# Patient Record
Sex: Female | Born: 1935 | Race: White | Hispanic: No | Smoking: Former smoker
Health system: Southern US, Community
[De-identification: ages and names within clinical notes are randomized; demographics above are authoritative.]

## PROBLEM LIST (undated history)

## (undated) DIAGNOSIS — N3281 Overactive bladder: Secondary | ICD-10-CM

## (undated) DIAGNOSIS — I2510 Atherosclerotic heart disease of native coronary artery without angina pectoris: Secondary | ICD-10-CM

## (undated) DIAGNOSIS — I10 Essential (primary) hypertension: Secondary | ICD-10-CM

## (undated) DIAGNOSIS — H3530 Unspecified macular degeneration: Secondary | ICD-10-CM

## (undated) DIAGNOSIS — G4700 Insomnia, unspecified: Secondary | ICD-10-CM

## (undated) DIAGNOSIS — C801 Malignant (primary) neoplasm, unspecified: Secondary | ICD-10-CM

## (undated) DIAGNOSIS — E039 Hypothyroidism, unspecified: Secondary | ICD-10-CM

## (undated) DIAGNOSIS — G4730 Sleep apnea, unspecified: Secondary | ICD-10-CM

## (undated) HISTORY — DX: Overactive bladder: N32.81

## (undated) HISTORY — DX: Essential (primary) hypertension: I10

## (undated) HISTORY — DX: Malignant (primary) neoplasm, unspecified: C80.1

## (undated) HISTORY — DX: Unspecified macular degeneration: H35.30

## (undated) HISTORY — DX: Sleep apnea, unspecified: G47.30

## (undated) HISTORY — DX: Insomnia, unspecified: G47.00

## (undated) HISTORY — PX: REPLACEMENT TOTAL KNEE BILATERAL: SUR1225

## (undated) HISTORY — PX: BACK SURGERY: SHX140

## (undated) HISTORY — PX: CHOLECYSTECTOMY: SHX55

## (undated) HISTORY — PX: HYSTEROTOMY: SHX1776

## (undated) HISTORY — PX: CATARACT EXTRACTION: SUR2

## (undated) HISTORY — DX: Atherosclerotic heart disease of native coronary artery without angina pectoris: I25.10

## (undated) HISTORY — PX: TONSILLECTOMY AND ADENOIDECTOMY: SUR1326

## (undated) HISTORY — PX: PTCA: SHX146

## (undated) HISTORY — DX: Hypothyroidism, unspecified: E03.9

## (undated) HISTORY — PX: APPENDECTOMY: SHX54

## (undated) HISTORY — PX: LYMPH GLAND EXCISION: SHX13

---

## 1988-12-09 DIAGNOSIS — I2510 Atherosclerotic heart disease of native coronary artery without angina pectoris: Secondary | ICD-10-CM

## 1988-12-09 HISTORY — DX: Atherosclerotic heart disease of native coronary artery without angina pectoris: I25.10

## 2013-03-09 ENCOUNTER — Encounter (HOSPITAL_BASED_OUTPATIENT_CLINIC_OR_DEPARTMENT_OTHER): Payer: Medicare Other | Attending: General Surgery

## 2013-03-09 DIAGNOSIS — L97909 Non-pressure chronic ulcer of unspecified part of unspecified lower leg with unspecified severity: Secondary | ICD-10-CM | POA: Insufficient documentation

## 2013-03-09 DIAGNOSIS — Z79899 Other long term (current) drug therapy: Secondary | ICD-10-CM | POA: Insufficient documentation

## 2013-03-10 NOTE — H&P (Signed)
NAMEMarland Kitchen  Bauer, Vicki NO.:  [id number]  MEDICAL RECORD NO.:  [id number]  LOCATION:  FOOT                         FACILITY:  [facility]  PHYSICIAN:  Joanne Gavel, M.D.        DATE OF BIRTH:  1928/06/20  DATE OF ADMISSION:  03/09/2013 DATE OF DISCHARGE:                             HISTORY & PHYSICAL   CHIEF COMPLAINT:  Wound, right leg.  HISTORY OF PRESENT ILLNESS:  This patient dropped a box on her right leg, developed a hematoma which was developed into a sizable wound. This occurred about 3 weeks ago.  She has a history of discoloration of the legs bilaterally.  PAST MEDICAL HISTORY:  Significant for shingles, lower extremity neuropathy, incontinence, and macular degeneration.  PAST SURGICAL HISTORY:  Tonsils and adenoids, hysterectomy, knee replacements, lymph node biopsies, cataracts both eyes, gallbladder, and appendectomy.  SOCIAL HISTORY:  Cigarettes none for 30 years.  Alcohol none for 30 years.  REVIEW OF SYSTEMS:  No heart disease, lung disease, or kidney disease.  MEDICATIONS:  Diovan, isosorbide, Metamucil, potassium, Tessalon, trazodone, Toviaz, amlodipine, Lyrica, tramadol, zolpidem, bupropion, Celebrex, levothyroxine, Klonopin, and bisoprolol.  ALLERGIES:  MORPHINE and DEMEROL.  REVIEW OF SYSTEMS:  As above.  PHYSICAL EXAMINATION:  VITAL SIGNS:  Temperature 98.4, pulse 84, respirations 20, blood pressure 115/71. GENERAL APPEARANCE:  Well developed, well nourished, no distress. CHEST:  Clear. HEART:  Regular rhythm. ABDOMEN:  Not examined. EXTREMITIES: Examination of the right lower extremity reveals an ABI of 0.8.  Peripheral pulses are weakly palpable.  Anteriorly is a 5.3 x 6.0 x 0.1 skin tear with dead skin covering a bright red base.  IMPRESSION:  Chronic venous hypertension with ulcer associated with trauma.  PLAN:  We will see her in 7 days.  We will start with Silvadene and Kerlix Coban wrap.     Joanne Gavel,  M.D.     RA/MEDQ  D:  03/09/2013  T:  03/10/2013  Job:  [id number]

## 2013-04-09 ENCOUNTER — Encounter (HOSPITAL_BASED_OUTPATIENT_CLINIC_OR_DEPARTMENT_OTHER): Payer: MEDICARE | Attending: General Surgery

## 2013-04-09 DIAGNOSIS — I87319 Chronic venous hypertension (idiopathic) with ulcer of unspecified lower extremity: Secondary | ICD-10-CM | POA: Diagnosis not present

## 2013-04-09 DIAGNOSIS — L97909 Non-pressure chronic ulcer of unspecified part of unspecified lower leg with unspecified severity: Secondary | ICD-10-CM | POA: Insufficient documentation

## 2013-04-09 DIAGNOSIS — Z79899 Other long term (current) drug therapy: Secondary | ICD-10-CM | POA: Insufficient documentation

## 2013-04-16 DIAGNOSIS — L97909 Non-pressure chronic ulcer of unspecified part of unspecified lower leg with unspecified severity: Secondary | ICD-10-CM | POA: Diagnosis not present

## 2013-04-16 DIAGNOSIS — Z79899 Other long term (current) drug therapy: Secondary | ICD-10-CM | POA: Diagnosis not present

## 2013-04-23 DIAGNOSIS — L97909 Non-pressure chronic ulcer of unspecified part of unspecified lower leg with unspecified severity: Secondary | ICD-10-CM | POA: Diagnosis not present

## 2013-04-23 DIAGNOSIS — Z79899 Other long term (current) drug therapy: Secondary | ICD-10-CM | POA: Diagnosis not present

## 2013-04-30 DIAGNOSIS — L97909 Non-pressure chronic ulcer of unspecified part of unspecified lower leg with unspecified severity: Secondary | ICD-10-CM | POA: Diagnosis not present

## 2013-04-30 DIAGNOSIS — Z79899 Other long term (current) drug therapy: Secondary | ICD-10-CM | POA: Diagnosis not present

## 2013-05-07 DIAGNOSIS — Z79899 Other long term (current) drug therapy: Secondary | ICD-10-CM | POA: Diagnosis not present

## 2013-05-07 DIAGNOSIS — L97909 Non-pressure chronic ulcer of unspecified part of unspecified lower leg with unspecified severity: Secondary | ICD-10-CM | POA: Diagnosis not present

## 2013-05-14 ENCOUNTER — Encounter (HOSPITAL_BASED_OUTPATIENT_CLINIC_OR_DEPARTMENT_OTHER): Payer: MEDICARE | Attending: General Surgery

## 2013-05-14 DIAGNOSIS — L97909 Non-pressure chronic ulcer of unspecified part of unspecified lower leg with unspecified severity: Secondary | ICD-10-CM | POA: Diagnosis not present

## 2013-05-21 DIAGNOSIS — I87319 Chronic venous hypertension (idiopathic) with ulcer of unspecified lower extremity: Secondary | ICD-10-CM | POA: Diagnosis not present

## 2013-05-28 DIAGNOSIS — I87319 Chronic venous hypertension (idiopathic) with ulcer of unspecified lower extremity: Secondary | ICD-10-CM | POA: Diagnosis not present

## 2013-06-04 DIAGNOSIS — L97909 Non-pressure chronic ulcer of unspecified part of unspecified lower leg with unspecified severity: Secondary | ICD-10-CM | POA: Diagnosis not present

## 2013-08-03 ENCOUNTER — Other Ambulatory Visit: Payer: Self-pay | Admitting: Pain Medicine

## 2013-08-03 DIAGNOSIS — M542 Cervicalgia: Secondary | ICD-10-CM

## 2013-08-03 DIAGNOSIS — M545 Low back pain: Secondary | ICD-10-CM

## 2013-08-03 DIAGNOSIS — M79604 Pain in right leg: Secondary | ICD-10-CM

## 2013-08-12 ENCOUNTER — Other Ambulatory Visit: Payer: Medicare Other

## 2013-08-16 ENCOUNTER — Other Ambulatory Visit: Payer: Medicare Other

## 2013-08-20 ENCOUNTER — Other Ambulatory Visit: Payer: Medicare Other

## 2013-08-23 ENCOUNTER — Other Ambulatory Visit: Payer: Medicare Other

## 2013-08-30 ENCOUNTER — Ambulatory Visit
Admission: RE | Admit: 2013-08-30 | Discharge: 2013-08-30 | Disposition: A | Discharge status: Home / Self Care | Payer: MEDICARE | Source: Ambulatory Visit | Attending: Pain Medicine | Admitting: Pain Medicine

## 2013-08-30 DIAGNOSIS — M79604 Pain in right leg: Secondary | ICD-10-CM

## 2013-08-30 DIAGNOSIS — M545 Low back pain: Secondary | ICD-10-CM

## 2013-08-30 DIAGNOSIS — M542 Cervicalgia: Secondary | ICD-10-CM

## 2013-08-30 MED ORDER — GADOBENATE DIMEGLUMINE 529 MG/ML IV SOLN
17.0000 mL | Freq: Once | INTRAVENOUS | Status: AC | PRN
  Administered 2013-08-30: 17 mL via INTRAVENOUS

## 2013-10-13 ENCOUNTER — Other Ambulatory Visit: Payer: Self-pay | Admitting: Pain Medicine

## 2013-10-13 DIAGNOSIS — M549 Dorsalgia, unspecified: Secondary | ICD-10-CM

## 2013-10-15 ENCOUNTER — Encounter (INDEPENDENT_AMBULATORY_CARE_PROVIDER_SITE_OTHER): Payer: MEDICARE | Admitting: Ophthalmology

## 2013-10-15 DIAGNOSIS — H3530 Unspecified macular degeneration: Secondary | ICD-10-CM

## 2013-10-15 DIAGNOSIS — H35039 Hypertensive retinopathy, unspecified eye: Secondary | ICD-10-CM

## 2013-10-15 DIAGNOSIS — H43819 Vitreous degeneration, unspecified eye: Secondary | ICD-10-CM

## 2013-10-15 DIAGNOSIS — I10 Essential (primary) hypertension: Secondary | ICD-10-CM

## 2013-10-20 ENCOUNTER — Other Ambulatory Visit: Payer: 59

## 2013-10-22 ENCOUNTER — Ambulatory Visit
Admission: RE | Admit: 2013-10-22 | Discharge: 2013-10-22 | Disposition: A | Discharge status: Home / Self Care | Payer: MEDICARE | Source: Ambulatory Visit | Attending: Pain Medicine | Admitting: Pain Medicine

## 2013-10-22 DIAGNOSIS — M549 Dorsalgia, unspecified: Secondary | ICD-10-CM

## 2014-02-21 ENCOUNTER — Encounter: Payer: Self-pay | Admitting: Diagnostic Neuroimaging

## 2014-02-22 ENCOUNTER — Encounter: Payer: Self-pay | Admitting: Diagnostic Neuroimaging

## 2014-02-22 ENCOUNTER — Ambulatory Visit (INDEPENDENT_AMBULATORY_CARE_PROVIDER_SITE_OTHER): Payer: MEDICARE | Admitting: Diagnostic Neuroimaging

## 2014-02-22 ENCOUNTER — Encounter (INDEPENDENT_AMBULATORY_CARE_PROVIDER_SITE_OTHER): Payer: Self-pay

## 2014-02-22 VITALS — BP 105/65 | HR 68 | Ht 61.5 in | Wt 176.0 lb

## 2014-02-22 DIAGNOSIS — B0229 Other postherpetic nervous system involvement: Secondary | ICD-10-CM

## 2014-02-22 NOTE — Patient Instructions (Signed)
Follow up with pain mgmt and psychiatry.  Consider cymbalta.  Consider increasing lyrica dosing.

## 2014-02-22 NOTE — Progress Notes (Signed)
[facility]  PATIENT: Vicki Bauer DOB: February 07, 1928  REFERRING CLINICIAN: Wolters HISTORY FROM: patient and son REASON FOR VISIT: new consult   HISTORICAL  CHIEF COMPLAINT:  Chief Complaint  Patient presents with  . Neurologic Problem    neuralgia    HISTORY OF PRESENT ILLNESS:   80 year old left-handed female with history of neuropathy in the 1990s, "lymph node cancer" status post chemotherapy and lymph node resections, bilateral neurolysis, low back surgery, here for evaluation of postherpetic neuralgia pain.  2 years ago patient had shingles infection affecting her right breast region, right arm./Axillary region, right scapula and back region. Patient was evaluated in [place] where she was living. She did not receive antiviral therapy. She had not received shingles vaccination prior to this. Patient did have chickenpox age 16 or 80 years old. Patient is to the medication as well as a "injection" into the armpit. Following this injection patient had significant right arm numbness and pain.  Patient has continued to have numbness and burning pain in the same area as well as numbness in the right arm. Patient is being managed by pain management specialist, currently on Lyrica 50 mg 3 times a day, gabapentin 600 mg 3 times a day, intermittent Percocet as needed, compounded neuropathy creams. Unfortunately she is still having severe pain.   REVIEW OF SYSTEMS: Full 14 system review of systems performed and notable only for numbness depression joint pain and running nose incontinence loss of vision.  ALLERGIES: Allergies  Allergen Reactions  . Demerol [Meperidine]     Vomiting   . Morphine And Related     Turns blue    HOME MEDICATIONS: Outpatient Prescriptions Prior to Visit  Medication Sig Dispense Refill  . bisoprolol (ZEBETA) 5 MG tablet Take 5 mg by mouth daily.      Marland Kitchen buPROPion (WELLBUTRIN SR) 200 MG 12 hr tablet Take 200 mg by mouth 2 (two) times  daily.      . clonazePAM (KLONOPIN) 0.5 MG tablet Take 0.5 mg by mouth daily.      . furosemide (LASIX) 40 MG tablet Take 40 mg by mouth daily.      Marland Kitchen levothyroxine (SYNTHROID, LEVOTHROID) 25 MCG tablet Take 25 mcg by mouth daily before breakfast.      . LORazepam (ATIVAN) 1 MG tablet Take 1 mg by mouth 2 (two) times daily.      . Multiple Vitamins-Minerals (CENTRUM SILVER ADULT 50+) TABS Take 1 tablet by mouth daily.      Marland Kitchen oxyCODONE-acetaminophen (PERCOCET) 10-325 MG per tablet Take 1 tablet by mouth every 4 (four) hours as needed for pain.      . potassium chloride (KLOR-CON) 20 MEQ packet Take 20 mEq by mouth daily.      . pregabalin (LYRICA) 50 MG capsule Take 50 mg by mouth 3 (three) times daily.      . Zinc 50 MG CAPS Take 50 mg by mouth daily.      Marland Kitchen gabapentin (NEURONTIN) 600 MG tablet Take 600 mg by mouth daily.      . psyllium (METAMUCIL) 0.52 G capsule Take 0.52 g by mouth as needed.       No facility-administered medications prior to visit.    PAST MEDICAL HISTORY: Past Medical History  Diagnosis Date  . Hypertension   . Hypothyroidism   . Macular degeneration   . Cancer     lymphoma  . Insomnia   . OAB (overactive bladder)   . CAD (coronary artery disease)  1990    s/p stent x2,    PAST SURGICAL HISTORY: Past Surgical History  Procedure Laterality Date  . Tonsillectomy and adenoidectomy    . Hysterotomy    . Replacement total knee bilateral    . Lymph gland excision Bilateral   . Cholecystectomy    . Cataract extraction Bilateral   . Appendectomy    . Ptca    . Back surgery      FAMILY HISTORY: Family History  Problem Relation Age of Onset  . Arthritis Mother   . Thyroid disease Son   . Breast cancer Sister     SOCIAL HISTORY:  History   Social History  . Marital Status: Single    Spouse Name: N/A    Number of Children: 1  . Years of Education: BA   Occupational History  . Retired    Social History Main Topics  . Smoking status: Former  Smoker -- 1.00 packs/day    Types: Cigarettes    Quit date: 12/09/1973  . Smokeless tobacco: Never Used  . Alcohol Use: No     Comment: quit: 1982  . Drug Use: No  . Sexual Activity: Not on file   Other Topics Concern  . Not on file   Social History Narrative   Patient lives at home with son.   Caffeine Use: 1-2 12 oz sodas daily     PHYSICAL EXAM  Filed Vitals:   02/22/14 0904  BP: 105/65  Pulse: 68  Height: 5' 1.5" (1.562 m)  Weight: 176 lb (79.833 kg)    Not recorded    Body mass index is 32.72 kg/(m^2).  GENERAL EXAM: Patient is in no distress; well developed, nourished and groomed; neck is supple  CARDIOVASCULAR: Regular rate and rhythm, no murmurs, no carotid bruits  NEUROLOGIC: MENTAL STATUS: awake, alert, oriented to person, place and time, recent and remote memory intact, normal attention and concentration, language fluent, comprehension intact, naming intact, fund of knowledge appropriate; LOQUACIOUS. CRANIAL NERVE: no papilledema on fundoscopic exam, pupils equal and reactive to light, visual fields full to confrontation, extraocular muscles intact, no nystagmus, facial sensation and strength symmetric, hearing intact, palate elevates symmetrically, uvula midline, shoulder shrug symmetric, tongue midline. MOTOR: normal bulk and tone, full strength in the BUE, BLE SENSORY: VIBRATION < 5 SEC AT TOES. DECR TEMP AT FEET. COORDINATION: finger-nose-finger, fine finger movements normal REFLEXES: deep tendon reflexes TRACE and symmetric GAIT/STATION: SLOW, SHUFFLING, UNSTEADY GAIT.     DIAGNOSTIC DATA (LABS, IMAGING, TESTING) - I reviewed patient records, labs, notes, testing and imaging myself where available.  No results found for this basename: WBC, HGB, HCT, MCV, PLT   No results found for this basename: na, k, cl, co2, glucose, bun, creatinine, calcium, prot, albumin, ast, alt, alkphos, bilitot, gfrnonaa, gfraa   No results found for this basename:  CHOL, HDL, LDLCALC, LDLDIRECT, TRIG, CHOLHDL   No results found for this basename: HGBA1C   No results found for this basename: VITAMINB12   No results found for this basename: TSH    I reviewed images myself and agree with interpretation. - VRP  08/31/13 MRI CERVICAL 1. Multilevel degenerative disc and facet disease, most notable at C3-4 and C4-5 where there is foraminal stenosis secondary to uncovertebral and facet spurs.  2. No significant canal stenosis.  10/23/13 MRI THORACIC  No significant abnormality of the thoracic spine. No findings to explain the patient's symptoms.  08/31/13 MRI LUMBAR  Diffuse degenerative disc and facet disease, most notable at  L5-S1 where anterolisthesis and advanced facet degeneration causes foraminal and canal (especially the lateral recesses) stenosis.   ASSESSMENT AND PLAN  80 y.o. year old female here with post herpetic neuralgia since 2013 (right upper thoracic).  PLAN: - continue increasing lyrica and medical mgmt per pain mgmt clinic - consider adding on cymbalta (vs. transitioning wellbutrin to cymbalta); would arrange in coordination with psychiatry  Return if symptoms worsen or fail to improve, for return to PCP.    Penni Bombard, MD 2/87/6811, 5:72 AM Certified in Neurology, Neurophysiology and Neuroimaging  [facility] [address] [place], [place] [address] [phone]

## 2014-02-25 ENCOUNTER — Encounter: Payer: Self-pay | Admitting: Diagnostic Neuroimaging

## 2014-09-19 ENCOUNTER — Emergency Department (HOSPITAL_COMMUNITY): Payer: MEDICARE

## 2014-09-19 ENCOUNTER — Emergency Department (HOSPITAL_COMMUNITY)
Admission: EM | Admit: 2014-09-19 | Discharge: 2014-09-20 | Disposition: A | Discharge status: Home / Self Care | Payer: MEDICARE | Attending: Emergency Medicine | Admitting: Emergency Medicine

## 2014-09-19 ENCOUNTER — Encounter (HOSPITAL_COMMUNITY): Payer: Self-pay | Admitting: Emergency Medicine

## 2014-09-19 DIAGNOSIS — E039 Hypothyroidism, unspecified: Secondary | ICD-10-CM | POA: Insufficient documentation

## 2014-09-19 DIAGNOSIS — Y9289 Other specified places as the place of occurrence of the external cause: Secondary | ICD-10-CM | POA: Diagnosis not present

## 2014-09-19 DIAGNOSIS — S199XXA Unspecified injury of neck, initial encounter: Secondary | ICD-10-CM | POA: Insufficient documentation

## 2014-09-19 DIAGNOSIS — Z8579 Personal history of other malignant neoplasms of lymphoid, hematopoietic and related tissues: Secondary | ICD-10-CM | POA: Insufficient documentation

## 2014-09-19 DIAGNOSIS — W01198A Fall on same level from slipping, tripping and stumbling with subsequent striking against other object, initial encounter: Secondary | ICD-10-CM | POA: Diagnosis not present

## 2014-09-19 DIAGNOSIS — Z79899 Other long term (current) drug therapy: Secondary | ICD-10-CM | POA: Diagnosis not present

## 2014-09-19 DIAGNOSIS — R51 Headache: Secondary | ICD-10-CM | POA: Diagnosis not present

## 2014-09-19 DIAGNOSIS — I2510 Atherosclerotic heart disease of native coronary artery without angina pectoris: Secondary | ICD-10-CM | POA: Diagnosis not present

## 2014-09-19 DIAGNOSIS — S8992XA Unspecified injury of left lower leg, initial encounter: Secondary | ICD-10-CM | POA: Diagnosis present

## 2014-09-19 DIAGNOSIS — Z87891 Personal history of nicotine dependence: Secondary | ICD-10-CM | POA: Insufficient documentation

## 2014-09-19 DIAGNOSIS — T148XXA Other injury of unspecified body region, initial encounter: Secondary | ICD-10-CM

## 2014-09-19 DIAGNOSIS — Y9389 Activity, other specified: Secondary | ICD-10-CM | POA: Diagnosis not present

## 2014-09-19 DIAGNOSIS — Z87448 Personal history of other diseases of urinary system: Secondary | ICD-10-CM | POA: Insufficient documentation

## 2014-09-19 DIAGNOSIS — Z9861 Coronary angioplasty status: Secondary | ICD-10-CM | POA: Diagnosis not present

## 2014-09-19 DIAGNOSIS — S8010XA Contusion of unspecified lower leg, initial encounter: Secondary | ICD-10-CM | POA: Diagnosis not present

## 2014-09-19 DIAGNOSIS — I10 Essential (primary) hypertension: Secondary | ICD-10-CM | POA: Insufficient documentation

## 2014-09-19 DIAGNOSIS — W19XXXA Unspecified fall, initial encounter: Secondary | ICD-10-CM

## 2014-09-19 NOTE — ED Notes (Signed)
Pt was attempting to her out of chair and fell over cane; pt struck left lower leg, hematoma noted to left lower leg; pt also fell backwards and struck head on floor; denies LOC; no hematoma or swelling noted to head; c/o neck tenderness and headache to the top of head; son reports that pt has been sleepy since the incident and that this is not a normal time for her to be sleepy

## 2014-09-20 ENCOUNTER — Emergency Department (HOSPITAL_COMMUNITY): Payer: MEDICARE

## 2014-09-20 DIAGNOSIS — S8010XA Contusion of unspecified lower leg, initial encounter: Secondary | ICD-10-CM | POA: Diagnosis not present

## 2014-09-20 NOTE — Discharge Instructions (Signed)
Fall Prevention and Home Safety Falls cause injuries and can affect all age groups. It is possible to use preventive measures to significantly decrease the likelihood of falls. There are many simple measures which can make your home safer and prevent falls. OUTDOORS  Repair cracks and edges of walkways and driveways.  Remove high doorway thresholds.  Trim shrubbery on the main path into your home.  Have good outside lighting.  Clear walkways of tools, rocks, debris, and clutter.  Check that handrails are not broken and are securely fastened. Both sides of steps should have handrails.  Have leaves, snow, and ice cleared regularly.  Use sand or salt on walkways during winter months.  In the garage, clean up grease or oil spills. BATHROOM  Install night lights.  Install grab bars by the toilet and in the tub and shower.  Use non-skid mats or decals in the tub or shower.  Place a plastic non-slip stool in the shower to sit on, if needed.  Keep floors dry and clean up all water on the floor immediately.  Remove soap buildup in the tub or shower on a regular basis.  Secure bath mats with non-slip, double-sided rug tape.  Remove throw rugs and tripping hazards from the floors. BEDROOMS  Install night lights.  Make sure a bedside light is easy to reach.  Do not use oversized bedding.  Keep a telephone by your bedside.  Have a firm chair with side arms to use for getting dressed.  Remove throw rugs and tripping hazards from the floor. KITCHEN  Keep handles on pots and pans turned toward the center of the stove. Use back burners when possible.  Clean up spills quickly and allow time for drying.  Avoid walking on wet floors.  Avoid hot utensils and knives.  Position shelves so they are not too high or low.  Place commonly used objects within easy reach.  If necessary, use a sturdy step stool with a grab bar when reaching.  Keep electrical cables out of the  way.  Do not use floor polish or wax that makes floors slippery. If you must use wax, use non-skid floor wax.  Remove throw rugs and tripping hazards from the floor. STAIRWAYS  Never leave objects on stairs.  Place handrails on both sides of stairways and use them. Fix any loose handrails. Make sure handrails on both sides of the stairways are as long as the stairs.  Check carpeting to make sure it is firmly attached along stairs. Make repairs to worn or loose carpet promptly.  Avoid placing throw rugs at the top or bottom of stairways, or properly secure the rug with carpet tape to prevent slippage. Get rid of throw rugs, if possible.  Have an electrician put in a light switch at the top and bottom of the stairs. OTHER FALL PREVENTION TIPS  Wear low-heel or rubber-soled shoes that are supportive and fit well. Wear closed toe shoes.  When using a stepladder, make sure it is fully opened and both spreaders are firmly locked. Do not climb a closed stepladder.  Add color or contrast paint or tape to grab bars and handrails in your home. Place contrasting color strips on first and last steps.  Learn and use mobility aids as needed. Install an electrical emergency response system.  Turn on lights to avoid dark areas. Replace light bulbs that burn out immediately. Get light switches that glow.  Arrange furniture to create clear pathways. Keep furniture in the same place.  Firmly attach carpet with non-skid or double-sided tape.  Eliminate uneven floor surfaces.  Select a carpet pattern that does not visually hide the edge of steps.  Be aware of all pets. OTHER HOME SAFETY TIPS  Set the water temperature for 120 F (48.8 C).  Keep emergency numbers on or near the telephone.  Keep smoke detectors on every level of the home and near sleeping areas. Document Released: 11/15/2002 Document Revised: 05/26/2012 Document Reviewed: 02/14/2012 [facility] Patient Information 2015  [place], [place]. This information is not intended to replace advice given to you by your health care provider. Make sure you discuss any questions you have with your health care provider. The Ct Scan of head and neck are normal, the xray of leg is normal   Your Mom has a hematoma that has been covered with a little pressure to decrease swelling and reduce the chance that it accidentally bumped and bleed.

## 2014-09-20 NOTE — ED Provider Notes (Signed)
CSN: [id number]     Arrival date & time 09/19/14  2012 History   First MD Initiated Contact with Patient 09/20/14 0028     Chief Complaint  Patient presents with  . Fall     (Consider location/radiation/quality/duration/timing/severity/associated sxs/prior Treatment) HPI Comments: Patient had machinal  fall landing on back hitting head on floor -- not having neck pani a small sore area on top of head and a hematoma on left shin  Patient is a 80 y.o. female presenting with fall. The history is provided by the patient.  Fall This is a new problem. The current episode started today. The problem occurs constantly. The problem has been unchanged. Associated symptoms include headaches and neck pain. Pertinent negatives include no fever, joint swelling, numbness or weakness. Nothing aggravates the symptoms. She has tried nothing for the symptoms. The treatment provided no relief.    Past Medical History  Diagnosis Date  . Hypertension   . Hypothyroidism   . Macular degeneration   . Cancer     lymphoma  . Insomnia   . OAB (overactive bladder)   . CAD (coronary artery disease) 1990    s/p stent x2,   Past Surgical History  Procedure Laterality Date  . Tonsillectomy and adenoidectomy    . Hysterotomy    . Replacement total knee bilateral    . Lymph gland excision Bilateral   . Cholecystectomy    . Cataract extraction Bilateral   . Appendectomy    . Ptca    . Back surgery     Family History  Problem Relation Age of Onset  . Arthritis Mother   . Thyroid disease Son   . Breast cancer Sister    History  Substance Use Topics  . Smoking status: Former Smoker -- 1.00 packs/day    Types: Cigarettes    Quit date: 12/09/1973  . Smokeless tobacco: Never Used  . Alcohol Use: No     Comment: quit: 1982   OB History   Grav Para Term Preterm Abortions TAB SAB Ect Mult Living                 Review of Systems  Constitutional: Negative for fever.  Musculoskeletal: Positive for  neck pain. Negative for back pain and joint swelling.  Skin: Positive for color change.  Neurological: Positive for headaches. Negative for dizziness, weakness and numbness.  All other systems reviewed and are negative.     Allergies  Demerol and Morphine and related  Home Medications   Prior to Admission medications   Medication Sig Start Date End Date Taking? Authorizing Provider  bisoprolol (ZEBETA) 5 MG tablet Take 5 mg by mouth daily.   Yes Historical Provider, MD  buPROPion (WELLBUTRIN SR) 200 MG 12 hr tablet Take 200 mg by mouth 2 (two) times daily.   Yes Historical Provider, MD  DULoxetine (CYMBALTA) 60 MG capsule Take 60 mg by mouth 2 (two) times daily.   Yes Historical Provider, MD  furosemide (LASIX) 40 MG tablet Take 40 mg by mouth daily.   Yes Historical Provider, MD  Gabapentin, PHN, (GRALISE) 600 MG TABS Take 1 tablet by mouth daily.   Yes Historical Provider, MD  levothyroxine (SYNTHROID, LEVOTHROID) 25 MCG tablet Take 25 mcg by mouth daily before breakfast.   Yes Historical Provider, MD  LORazepam (ATIVAN) 1 MG tablet Take 1 mg by mouth daily.    Yes Historical Provider, MD  Multiple Vitamins-Minerals (CENTRUM SILVER ADULT 50+) TABS Take 1 tablet by mouth  daily.   Yes Historical Provider, MD  oxyCODONE-acetaminophen (PERCOCET) 10-325 MG per tablet Take 1 tablet by mouth every 4 (four) hours as needed for pain.   Yes Historical Provider, MD  potassium chloride (KLOR-CON) 20 MEQ packet Take 20 mEq by mouth daily.   Yes Historical Provider, MD  pregabalin (LYRICA) 50 MG capsule Take 50 mg by mouth 3 (three) times daily.   Yes Historical Provider, MD  Zinc 50 MG CAPS Take 100 mg by mouth daily.    Yes Historical Provider, MD   BP 110/68  Pulse 79  Temp(Src) 98.1 F (36.7 C) (Oral)  Resp 18  SpO2 95% Physical Exam  Nursing note and vitals reviewed. Constitutional: She is oriented to person, place, and time. She appears well-developed and well-nourished.  HENT:  Head:  Normocephalic.  Eyes: Pupils are equal, round, and reactive to light.  Neck: Normal range of motion. Muscular tenderness present. No spinous process tenderness present.  Cardiovascular: Normal rate and regular rhythm.   Pulmonary/Chest: Effort normal and breath sounds normal.  Abdominal: Soft. Bowel sounds are normal.  Musculoskeletal: Normal range of motion. She exhibits tenderness. She exhibits no edema.       Legs: Neurological: She is alert and oriented to person, place, and time.  Skin: Skin is warm. No erythema.    ED Course  Procedures (including critical care time) Labs Review Labs Reviewed - No data to display  Imaging Review Dg Tibia/fibula Left  09/19/2014   CLINICAL DATA:  Status post fall today. Pain and bruising mid left lower leg.  EXAM: LEFT TIBIA AND FIBULA - 2 VIEW  COMPARISON:  None.  FINDINGS: No acute bony or joint abnormality is identified. The patient is status post left knee replacement. Hardware is intact.  IMPRESSION: Negative for fracture.   Electronically Signed   By: Inge Rise M.D.   On: 09/19/2014 21:30   Ct Head Wo Contrast  09/20/2014   CLINICAL DATA:  [place] in the living room after dinner, posterior neck pain. No loss of consciousness. History lymphoma.  EXAM: CT HEAD WITHOUT CONTRAST  CT CERVICAL SPINE WITHOUT CONTRAST  TECHNIQUE: Multidetector CT imaging of the head and cervical spine was performed following the standard protocol without intravenous contrast. Multiplanar CT image reconstructions of the cervical spine were also generated.  COMPARISON:  MRI of the cervical spine August 30, 2013  FINDINGS: CT HEAD FINDINGS  The ventricles and sulci are normal for age. No intraparenchymal hemorrhage, mass effect nor midline shift. Patchy supratentorial white matter hypodensities are within normal range for patient's age and though non-specific suggest sequelae of chronic small vessel ischemic disease. No acute large vascular territory infarcts.  No  abnormal extra-axial fluid collections. Basal cisterns are patent. Moderate calcific atherosclerosis of the included vertebral arteries. Mild calcific atherosclerosis of the carotid siphons.  No skull fracture. The included ocular globes and orbital contents are non-suspicious. Status post bilateral ocular lens implants. The mastoid aircells and included paranasal sinuses are well-aerated. Moderate RIGHT temporomandibular osteoarthrosis.  CT CERVICAL SPINE FINDINGS  Cervical vertebral bodies and posterior elements are intact and aligned without reversed cervical lordosis. Broad reversed cervical lordosis. Moderate to severe C4-5 through C5-6 degenerative disc, mild at C6-7 and C7-T1. C1-2 articulation maintained with severe arthropathy. Mild pes about the odontoid process can be seen with CPPD. No destructive bony lesions. Moderate vascular calcifications. Included view of the lungs demonstrates centrilobular emphysema.  No osseous canal stenosis. Severe C3-4 LEFT neural foraminal narrowing.  IMPRESSION: CT head: No  acute intracranial process; normal noncontrast CT of the head for age.  CT cervical spine: Broad reversed cervical lordosis without acute fracture nor malalignment.   Electronically Signed   By: Elon Alas   On: 09/20/2014 02:16   Ct Cervical Spine Wo Contrast  09/20/2014   CLINICAL DATA:  [place] in the living room after dinner, posterior neck pain. No loss of consciousness. History lymphoma.  EXAM: CT HEAD WITHOUT CONTRAST  CT CERVICAL SPINE WITHOUT CONTRAST  TECHNIQUE: Multidetector CT imaging of the head and cervical spine was performed following the standard protocol without intravenous contrast. Multiplanar CT image reconstructions of the cervical spine were also generated.  COMPARISON:  MRI of the cervical spine August 30, 2013  FINDINGS: CT HEAD FINDINGS  The ventricles and sulci are normal for age. No intraparenchymal hemorrhage, mass effect nor midline shift. Patchy supratentorial  white matter hypodensities are within normal range for patient's age and though non-specific suggest sequelae of chronic small vessel ischemic disease. No acute large vascular territory infarcts.  No abnormal extra-axial fluid collections. Basal cisterns are patent. Moderate calcific atherosclerosis of the included vertebral arteries. Mild calcific atherosclerosis of the carotid siphons.  No skull fracture. The included ocular globes and orbital contents are non-suspicious. Status post bilateral ocular lens implants. The mastoid aircells and included paranasal sinuses are well-aerated. Moderate RIGHT temporomandibular osteoarthrosis.  CT CERVICAL SPINE FINDINGS  Cervical vertebral bodies and posterior elements are intact and aligned without reversed cervical lordosis. Broad reversed cervical lordosis. Moderate to severe C4-5 through C5-6 degenerative disc, mild at C6-7 and C7-T1. C1-2 articulation maintained with severe arthropathy. Mild pes about the odontoid process can be seen with CPPD. No destructive bony lesions. Moderate vascular calcifications. Included view of the lungs demonstrates centrilobular emphysema.  No osseous canal stenosis. Severe C3-4 LEFT neural foraminal narrowing.  IMPRESSION: CT head: No acute intracranial process; normal noncontrast CT of the head for age.  CT cervical spine: Broad reversed cervical lordosis without acute fracture nor malalignment.   Electronically Signed   By: Elon Alas   On: 09/20/2014 02:16     EKG Interpretation None      MDM   Final diagnoses:  Fall, initial encounter  Hematoma         Garald Balding, NP 09/20/14 0867

## 2014-09-20 NOTE — ED Provider Notes (Signed)
Medical screening examination/treatment/procedure(s) were performed by non-physician practitioner and as supervising physician I was immediately available for consultation/collaboration.   EKG Interpretation None        Everlene Balls, MD 09/20/14 1411

## 2014-11-17 ENCOUNTER — Encounter (HOSPITAL_BASED_OUTPATIENT_CLINIC_OR_DEPARTMENT_OTHER): Payer: 59

## 2014-11-18 ENCOUNTER — Ambulatory Visit (HOSPITAL_BASED_OUTPATIENT_CLINIC_OR_DEPARTMENT_OTHER): Payer: Medicare Other | Attending: Family Medicine | Admitting: Radiology

## 2014-11-18 VITALS — Ht 62.0 in | Wt 180.0 lb

## 2014-11-18 DIAGNOSIS — G4733 Obstructive sleep apnea (adult) (pediatric): Secondary | ICD-10-CM | POA: Insufficient documentation

## 2014-11-18 DIAGNOSIS — G4730 Sleep apnea, unspecified: Secondary | ICD-10-CM | POA: Diagnosis present

## 2014-11-20 DIAGNOSIS — G4733 Obstructive sleep apnea (adult) (pediatric): Secondary | ICD-10-CM

## 2014-11-20 NOTE — Sleep Study (Signed)
   NAME: Vicki Bauer DATE OF BIRTH:  1928-06-27 MEDICAL RECORD NUMBER [id number]  LOCATION: [facility]  PHYSICIAN: Avary Pitsenbarger D  DATE OF STUDY: 11/18/2014  SLEEP STUDY TYPE: Nocturnal Polysomnogram               REFERRING PHYSICIAN: Lilian Coma, MD  INDICATION FOR STUDY: Hypersomnia with sleep apnea  EPWORTH SLEEPINESS SCORE:   9/24 HEIGHT: 5\' 2"  (157.5 cm)  WEIGHT: 180 lb (81.647 kg)    Body mass index is 32.91 kg/(m^2).  NECK SIZE: 15 in.  MEDICATIONS: Charted for review  SLEEP ARCHITECTURE: Split study protocol. During the diagnostic phase, total sleep time 120 minutes with sleep efficiency 94.5%. Stage I was 7.1%, stage II 92.9%, stage III and REM were absent. Sleep latency 5 minutes, REM latency and a, awake after sleep onset 2 minutes, arousal index 52.5, bedtime medication: Ambien, lorazepam.  RESPIRATORY DATA: Apnea hypopnea index (AHI) 97.0 per hour. 194 total events scored including 127 obstructive apneas and 67 hypopneas. All events were while supine. CPAP was titrated to 15 CWP with inadequate control and an AHI of 45.6 per hour. The technician then changed to bilevel and titrated to a final inspiratory pressure of 20 and expiratory pressure of 16, AHI 0.0 per hour. She wore a small fullface mask and supplemental oxygen.  OXYGEN DATA: Before CPAP snoring was mild to moderate with oxygen desaturation to a nadir of 69% on room air. Because of persistent low early saturations, the technician began supplemental oxygen at 1 L, starting at 12:44 AM. Oxygen was increased to 2 L at 12:58 AM because of persistent saturation in the 80s. Oxygen was finally increased to 3 L at 1:10 AM because of saturations still in the 80s despite CPAP.  CARDIAC DATA: Normal sinus rhythm  MOVEMENT/PARASOMNIA: No significant movement disturbance, no bathroom trips  IMPRESSION/ RECOMMENDATION:   1) Severe obstructive sleep apnea/hypopnea syndrome, AHI 97 per hour  with supine events. Moderate snoring with oxygen desaturation to a nadir of 69% on room air. 2) CPAP titration to 15 CWP did not control events, with residual AHI 45.6 per hour. She was then changed to bilevel (BiPAP) and titrated to inspiratory pressure of 20 and expiratory pressure of 16 CWP, AHI 0 per hour. She wore a small ResMed AirRit F10 mask with heated humidifier. 3) On arrival on room air while awake, oxygen saturation was 82%. Because of persistent desaturation into the 80s despite CPAP, supplemental oxygen was added at 3 L/m through her bilevel machine. This Oxygen saturation at 90% or better for the conclusion of the study. She had recorded 50.7 minutes with oxygen saturation less than 88% on room air. Saturations were still in the mid 80% range with BiPAP at 3 L until she reached her final pressures.   Deneise Lever Diplomate, American Board of Sleep Medicine  ELECTRONICALLY SIGNED ON:  11/20/2014, 4:11 PM [place] PH: [phone]   FX: [phone] [place]

## 2014-11-29 ENCOUNTER — Observation Stay (HOSPITAL_COMMUNITY): Payer: MEDICARE

## 2014-11-29 ENCOUNTER — Encounter (HOSPITAL_COMMUNITY): Payer: Self-pay | Admitting: *Deleted

## 2014-11-29 ENCOUNTER — Inpatient Hospital Stay (HOSPITAL_COMMUNITY)
Admission: EM | Admit: 2014-11-29 | Discharge: 2014-12-01 | DRG: 690 | Disposition: A | Discharge status: Home Health | Payer: MEDICARE | Attending: Internal Medicine | Admitting: Internal Medicine

## 2014-11-29 ENCOUNTER — Emergency Department (HOSPITAL_COMMUNITY): Payer: MEDICARE

## 2014-11-29 DIAGNOSIS — Z90710 Acquired absence of both cervix and uterus: Secondary | ICD-10-CM

## 2014-11-29 DIAGNOSIS — Z9049 Acquired absence of other specified parts of digestive tract: Secondary | ICD-10-CM | POA: Diagnosis present

## 2014-11-29 DIAGNOSIS — Z7982 Long term (current) use of aspirin: Secondary | ICD-10-CM

## 2014-11-29 DIAGNOSIS — E876 Hypokalemia: Secondary | ICD-10-CM | POA: Diagnosis present

## 2014-11-29 DIAGNOSIS — F0390 Unspecified dementia without behavioral disturbance: Secondary | ICD-10-CM | POA: Diagnosis present

## 2014-11-29 DIAGNOSIS — Z9841 Cataract extraction status, right eye: Secondary | ICD-10-CM

## 2014-11-29 DIAGNOSIS — Z9842 Cataract extraction status, left eye: Secondary | ICD-10-CM

## 2014-11-29 DIAGNOSIS — E039 Hypothyroidism, unspecified: Secondary | ICD-10-CM | POA: Diagnosis present

## 2014-11-29 DIAGNOSIS — N3281 Overactive bladder: Secondary | ICD-10-CM | POA: Diagnosis present

## 2014-11-29 DIAGNOSIS — Z955 Presence of coronary angioplasty implant and graft: Secondary | ICD-10-CM

## 2014-11-29 DIAGNOSIS — B9620 Unspecified Escherichia coli [E. coli] as the cause of diseases classified elsewhere: Secondary | ICD-10-CM | POA: Diagnosis present

## 2014-11-29 DIAGNOSIS — Z8572 Personal history of non-Hodgkin lymphomas: Secondary | ICD-10-CM

## 2014-11-29 DIAGNOSIS — R4701 Aphasia: Secondary | ICD-10-CM | POA: Diagnosis not present

## 2014-11-29 DIAGNOSIS — G4700 Insomnia, unspecified: Secondary | ICD-10-CM | POA: Diagnosis present

## 2014-11-29 DIAGNOSIS — I639 Cerebral infarction, unspecified: Secondary | ICD-10-CM | POA: Diagnosis present

## 2014-11-29 DIAGNOSIS — Z87891 Personal history of nicotine dependence: Secondary | ICD-10-CM

## 2014-11-29 DIAGNOSIS — N390 Urinary tract infection, site not specified: Secondary | ICD-10-CM | POA: Diagnosis present

## 2014-11-29 DIAGNOSIS — I10 Essential (primary) hypertension: Secondary | ICD-10-CM | POA: Diagnosis present

## 2014-11-29 DIAGNOSIS — I2510 Atherosclerotic heart disease of native coronary artery without angina pectoris: Secondary | ICD-10-CM | POA: Diagnosis present

## 2014-11-29 DIAGNOSIS — I5032 Chronic diastolic (congestive) heart failure: Secondary | ICD-10-CM | POA: Diagnosis present

## 2014-11-29 DIAGNOSIS — Z886 Allergy status to analgesic agent status: Secondary | ICD-10-CM

## 2014-11-29 DIAGNOSIS — H3530 Unspecified macular degeneration: Secondary | ICD-10-CM | POA: Diagnosis present

## 2014-11-29 LAB — PROTIME-INR
INR: 0.99 (ref 0.00–1.49)
Prothrombin Time: 13.2 seconds (ref 11.6–15.2)

## 2014-11-29 LAB — COMPREHENSIVE METABOLIC PANEL
ALT: 18 U/L (ref 0–35)
ANION GAP: 10 (ref 5–15)
AST: 28 U/L (ref 0–37)
Albumin: 3.4 g/dL — ABNORMAL LOW (ref 3.5–5.2)
Alkaline Phosphatase: 93 U/L (ref 39–117)
BUN: 11 mg/dL (ref 6–23)
CALCIUM: 8.8 mg/dL (ref 8.4–10.5)
CO2: 31 mmol/L (ref 19–32)
Chloride: 99 mEq/L (ref 96–112)
Creatinine, Ser: 0.75 mg/dL (ref 0.50–1.10)
GFR calc non Af Amer: 76 mL/min — ABNORMAL LOW (ref >90)
GFR, EST AFRICAN AMERICAN: 88 mL/min — AB (ref >90)
Glucose, Bld: 91 mg/dL (ref 70–99)
Potassium: 2.3 mmol/L — CL (ref 3.5–5.1)
SODIUM: 140 mmol/L (ref 135–145)
TOTAL PROTEIN: 5.3 g/dL — AB (ref 6.0–8.3)
Total Bilirubin: 1 mg/dL (ref 0.3–1.2)

## 2014-11-29 LAB — CBC
HCT: 37.3 % (ref 36.0–46.0)
HCT: 39.4 % (ref 36.0–46.0)
Hemoglobin: 11.8 g/dL — ABNORMAL LOW (ref 12.0–15.0)
Hemoglobin: 12.5 g/dL (ref 12.0–15.0)
MCH: 26.8 pg (ref 26.0–34.0)
MCH: 26.9 pg (ref 26.0–34.0)
MCHC: 31.6 g/dL (ref 30.0–36.0)
MCHC: 31.7 g/dL (ref 30.0–36.0)
MCV: 84.6 fL (ref 78.0–100.0)
MCV: 84.9 fL (ref 78.0–100.0)
PLATELETS: 100 10*3/uL — AB (ref 150–400)
PLATELETS: 106 10*3/uL — AB (ref 150–400)
RBC: 4.41 MIL/uL (ref 3.87–5.11)
RBC: 4.64 MIL/uL (ref 3.87–5.11)
RDW: 14.9 % (ref 11.5–15.5)
RDW: 15 % (ref 11.5–15.5)
WBC: 5 10*3/uL (ref 4.0–10.5)
WBC: 5.3 10*3/uL (ref 4.0–10.5)

## 2014-11-29 LAB — RAPID URINE DRUG SCREEN, HOSP PERFORMED
AMPHETAMINES: NOT DETECTED
Barbiturates: NOT DETECTED
Benzodiazepines: POSITIVE — AB
Cocaine: NOT DETECTED
Opiates: NOT DETECTED
Tetrahydrocannabinol: NOT DETECTED

## 2014-11-29 LAB — URINE MICROSCOPIC-ADD ON

## 2014-11-29 LAB — URINALYSIS, ROUTINE W REFLEX MICROSCOPIC
Bilirubin Urine: NEGATIVE
GLUCOSE, UA: NEGATIVE mg/dL
Ketones, ur: NEGATIVE mg/dL
Nitrite: POSITIVE — AB
Protein, ur: NEGATIVE mg/dL
SPECIFIC GRAVITY, URINE: 1.017 (ref 1.005–1.030)
Urobilinogen, UA: 0.2 mg/dL (ref 0.0–1.0)
pH: 6 (ref 5.0–8.0)

## 2014-11-29 LAB — DIFFERENTIAL
Basophils Absolute: 0 10*3/uL (ref 0.0–0.1)
Basophils Relative: 0 % (ref 0–1)
Eosinophils Absolute: 0.1 10*3/uL (ref 0.0–0.7)
Eosinophils Relative: 2 % (ref 0–5)
LYMPHS ABS: 1.5 10*3/uL (ref 0.7–4.0)
Lymphocytes Relative: 30 % (ref 12–46)
MONO ABS: 0.5 10*3/uL (ref 0.1–1.0)
Monocytes Relative: 10 % (ref 3–12)
Neutro Abs: 2.9 10*3/uL (ref 1.7–7.7)
Neutrophils Relative %: 58 % (ref 43–77)

## 2014-11-29 LAB — I-STAT TROPONIN, ED: Troponin i, poc: 0.02 ng/mL (ref 0.00–0.08)

## 2014-11-29 LAB — ETHANOL

## 2014-11-29 LAB — APTT: aPTT: 27 seconds (ref 24–37)

## 2014-11-29 MED ORDER — ENOXAPARIN SODIUM 30 MG/0.3ML ~~LOC~~ SOLN
30.0000 mg | SUBCUTANEOUS | Status: DC
  Administered 2014-11-30: 30 mg via SUBCUTANEOUS
  Filled 2014-11-29: qty 0.3

## 2014-11-29 MED ORDER — FUROSEMIDE 40 MG PO TABS
40.0000 mg | ORAL_TABLET | Freq: Every day | ORAL | Status: DC
  Administered 2014-11-30 – 2014-12-01 (×2): 40 mg via ORAL
  Filled 2014-11-29 (×2): qty 1

## 2014-11-29 MED ORDER — LORAZEPAM 1 MG PO TABS
1.0000 mg | ORAL_TABLET | Freq: Every day | ORAL | Status: DC
  Administered 2014-11-30 (×2): 1 mg via ORAL
  Filled 2014-11-29 (×2): qty 1

## 2014-11-29 MED ORDER — STROKE: EARLY STAGES OF RECOVERY BOOK
Freq: Once | Status: AC
  Administered 2014-11-29
  Filled 2014-11-29: qty 1

## 2014-11-29 MED ORDER — BISOPROLOL FUMARATE 5 MG PO TABS
5.0000 mg | ORAL_TABLET | Freq: Every day | ORAL | Status: DC
  Administered 2014-11-30 – 2014-12-01 (×2): 5 mg via ORAL
  Filled 2014-11-29 (×2): qty 1

## 2014-11-29 MED ORDER — ASPIRIN 300 MG RE SUPP: 300.0000 mg | Freq: Every day | RECTAL | Status: DC

## 2014-11-29 MED ORDER — DEXTROSE 5 % IV SOLN
1.0000 g | Freq: Once | INTRAVENOUS | Status: AC
  Administered 2014-11-29: 1 g via INTRAVENOUS
  Filled 2014-11-29: qty 10

## 2014-11-29 MED ORDER — BUPROPION HCL ER (SR) 100 MG PO TB12
200.0000 mg | ORAL_TABLET | Freq: Two times a day (BID) | ORAL | Status: DC
  Administered 2014-11-30 – 2014-12-01 (×4): 200 mg via ORAL
  Filled 2014-11-29 (×6): qty 2

## 2014-11-29 MED ORDER — GABAPENTIN (ONCE-DAILY) 600 MG PO TABS: 1.0000 | ORAL_TABLET | Freq: Every day | ORAL | Status: DC

## 2014-11-29 MED ORDER — DULOXETINE HCL 60 MG PO CPEP
60.0000 mg | ORAL_CAPSULE | Freq: Two times a day (BID) | ORAL | Status: DC
  Administered 2014-11-30 – 2014-12-01 (×4): 60 mg via ORAL
  Filled 2014-11-29 (×4): qty 1

## 2014-11-29 MED ORDER — ASPIRIN 325 MG PO TABS
325.0000 mg | ORAL_TABLET | Freq: Every day | ORAL | Status: DC
  Administered 2014-11-30 – 2014-12-01 (×2): 325 mg via ORAL
  Filled 2014-11-29 (×2): qty 1

## 2014-11-29 MED ORDER — SENNOSIDES-DOCUSATE SODIUM 8.6-50 MG PO TABS
1.0000 | ORAL_TABLET | Freq: Every evening | ORAL | Status: DC | PRN
  Filled 2014-11-29: qty 1

## 2014-11-29 MED ORDER — ZINC SULFATE 220 (50 ZN) MG PO CAPS
220.0000 mg | ORAL_CAPSULE | Freq: Every day | ORAL | Status: DC
  Administered 2014-11-30 – 2014-12-01 (×2): 220 mg via ORAL
  Filled 2014-11-29 (×2): qty 1

## 2014-11-29 MED ORDER — LEVOTHYROXINE SODIUM 25 MCG PO TABS
25.0000 ug | ORAL_TABLET | Freq: Every day | ORAL | Status: DC
  Administered 2014-11-30 – 2014-12-01 (×2): 25 ug via ORAL
  Filled 2014-11-29 (×2): qty 1

## 2014-11-29 MED ORDER — DEXTROSE 5 % IV SOLN
1.0000 g | INTRAVENOUS | Status: DC
  Administered 2014-11-30 (×2): 1 g via INTRAVENOUS
  Filled 2014-11-29 (×3): qty 10

## 2014-11-29 MED ORDER — POTASSIUM CHLORIDE CRYS ER 20 MEQ PO TBCR
60.0000 meq | EXTENDED_RELEASE_TABLET | Freq: Once | ORAL | Status: AC
  Administered 2014-11-29: 60 meq via ORAL
  Filled 2014-11-29: qty 3

## 2014-11-29 MED ORDER — ADULT MULTIVITAMIN W/MINERALS CH
1.0000 | ORAL_TABLET | Freq: Every day | ORAL | Status: DC
  Administered 2014-11-30 – 2014-12-01 (×2): 1 via ORAL
  Filled 2014-11-29 (×2): qty 1

## 2014-11-29 MED ORDER — ZOLPIDEM TARTRATE 5 MG PO TABS
5.0000 mg | ORAL_TABLET | Freq: Every day | ORAL | Status: DC
  Administered 2014-11-30 (×2): 5 mg via ORAL
  Filled 2014-11-29 (×2): qty 1

## 2014-11-29 NOTE — ED Notes (Signed)
Report attempted 

## 2014-11-29 NOTE — ED Notes (Signed)
Pt. Son came home and said his mom seemed altered. Pt. Son stated she had a hard forming words and remembering simple things like her birthday. Pt. Was pretty much nonverbal. Pt. Son states last seen normal was last night 12/21 at 8pm.

## 2014-11-29 NOTE — Consult Note (Addendum)
Consult Reason for Consult:altered mental status Referring Physician: Dr Ernestina Patches  CC: altered mental status  HPI: Vicki Bauer is an 80 y.o. female with PMH significant for HTN. Hypothyroidism, OAB, CAD s/p stenting x2, presenting to the ED for AMS and expressive aphasia. Patient was last seen normal by her son around 2000 yesterday evening. Son states he talked with her today and she appeared to have a lot of difficulty getting her words out. Did not always make sense. Symptoms have improved but she continues to have expressive aphasia. She denies any difficulty understanding anyone else. Denies recent head injury, trauma, falls. No prior hx of TIA or stroke. Patient not currently on anti-coagulation. She denies numbness, paresthesias, or focal weakness.  CT head imaging reviewed and no acute process. UA positive for UTI.   Past Medical History  Diagnosis Date  . Hypertension   . Hypothyroidism   . Macular degeneration   . Cancer     lymphoma  . Insomnia   . OAB (overactive bladder)   . CAD (coronary artery disease) 1990    s/p stent x2,    Past Surgical History  Procedure Laterality Date  . Tonsillectomy and adenoidectomy    . Hysterotomy    . Replacement total knee bilateral    . Lymph gland excision Bilateral   . Cholecystectomy    . Cataract extraction Bilateral   . Appendectomy    . Ptca    . Back surgery      Family History  Problem Relation Age of Onset  . Arthritis Mother   . Thyroid disease Son   . Breast cancer Sister     Social History:  reports that she quit smoking about 41 years ago. Her smoking use included Cigarettes. She smoked 1.00 pack per day. She has never used smokeless tobacco. She reports that she does not drink alcohol or use illicit drugs.  Allergies  Allergen Reactions  . Demerol [Meperidine] Other (See Comments)    Turned patient blue and couldn't breathe  . Morphine And Related Other (See Comments)    Turned patient blue and  couldn't breathe   . Macrolides And Ketolides Nausea Only    Medications: Scheduled:  ROS: Out of a complete 14 system review, the patient complains of only the following symptoms, and all other reviewed systems are negative. + fatigue, difficulty talking  Physical Examination: Filed Vitals:   11/29/14 2126  BP: 166/50  Pulse: 70  Temp:   Resp: 18   Physical Exam  Constitutional: He appears well-developed and well-nourished.  Psych: Affect appropriate to situation Eyes: No scleral injection HENT: No OP obstrucion Head: Normocephalic.  Cardiovascular: Normal rate and regular rhythm.  Respiratory: Effort normal and breath sounds normal.  GI: Soft. Bowel sounds are normal. No distension. There is no tenderness.  Skin: WDI  Neurologic Examination Mental Status: Alert, oriented, thought content appropriate. Mild expressive aphasia noted. No dysarthria.  Able to follow 3 step commands without difficulty. Cranial Nerves: II: funduscopic exam wnl bilaterally, visual fields grossly normal, pupils equal, round, reactive to light and accommodation III,IV, VI: ptosis not present, extra-ocular motions intact bilaterally V,VII: smile symmetric, facial light touch sensation normal bilaterally VIII: hearing normal bilaterally IX,X: gag reflex present XI: trapezius strength/neck flexion strength normal bilaterally XII: tongue strength normal  Motor: Moves all extremities symmetrically and against light resistance. Limited ROM right shoulder due to chronic pain Tone and bulk:normal tone throughout; no atrophy noted Sensory: Pinprick and light touch intact throughout, bilaterally Deep  Tendon Reflexes: 2+ and symmetric throughout Plantars: Right: downgoing   Left: downgoing Gait: did not test due to multiple leads in ED  Laboratory Studies:   Basic Metabolic Panel:  Recent Labs Lab 11/29/14 2010  NA 140  K 2.3*  CL 99  CO2 31  GLUCOSE 91  BUN 11  CREATININE 0.75   CALCIUM 8.8    Liver Function Tests:  Recent Labs Lab 11/29/14 2010  AST 28  ALT 18  ALKPHOS 93  BILITOT 1.0  PROT 5.3*  ALBUMIN 3.4*   No results for input(s): LIPASE, AMYLASE in the last 168 hours. No results for input(s): AMMONIA in the last 168 hours.  CBC:  Recent Labs Lab 11/29/14 2010  WBC 5.0  NEUTROABS 2.9  HGB 12.5  HCT 39.4  MCV 84.9  PLT 100*    Cardiac Enzymes: No results for input(s): CKTOTAL, CKMB, CKMBINDEX, TROPONINI in the last 168 hours.  BNP: Invalid input(s): POCBNP  CBG: No results for input(s): GLUCAP in the last 168 hours.  Microbiology: No results found for this or any previous visit.  Coagulation Studies:  Recent Labs  11/29/14 2010  LABPROT 13.2  INR 0.99    Urinalysis:  Recent Labs Lab 11/29/14 2034  COLORURINE AMBER*  LABSPEC 1.017  PHURINE 6.0  GLUCOSEU NEGATIVE  HGBUR TRACE*  BILIRUBINUR NEGATIVE  KETONESUR NEGATIVE  PROTEINUR NEGATIVE  UROBILINOGEN 0.2  NITRITE POSITIVE*  LEUKOCYTESUR LARGE*    Lipid Panel:  No results found for: CHOL, TRIG, HDL, CHOLHDL, VLDL, LDLCALC  HgbA1C: No results found for: HGBA1C  Urine Drug Screen:     Component Value Date/Time   LABOPIA NONE DETECTED 11/29/2014 2034   COCAINSCRNUR NONE DETECTED 11/29/2014 2034   LABBENZ POSITIVE* 11/29/2014 2034   AMPHETMU NONE DETECTED 11/29/2014 2034   THCU NONE DETECTED 11/29/2014 2034   LABBARB NONE DETECTED 11/29/2014 2034    Alcohol Level:  Recent Labs Lab 11/29/14 2010  [place] <5    Other results: EKG: normal EKG, normal sinus rhythm.  Imaging: Ct Head Wo Contrast  11/29/2014   CLINICAL DATA:  80 year old female with altered mental status  EXAM: CT HEAD WITHOUT CONTRAST  TECHNIQUE: Contiguous axial images were obtained from the base of the skull through the vertex without intravenous contrast.  COMPARISON:  Prior head CT 09/20/2014  FINDINGS: Negative for acute intracranial hemorrhage, acute infarction, mass, mass effect,  hydrocephalus or midline shift. Gray-white differentiation is preserved throughout. Similar pattern of cerebral and cerebellar atrophy and advanced chronic microvascular ischemic white matter disease. No focal soft tissue or calvarial abnormality. Globes and orbits are intact and unremarkable bilaterally. Normal aeration of the mastoid air cells and paranasal sinuses. Intracranial atherosclerosis of the bilateral cavernous carotid arteries. Hyperostosis frontalis interna.  IMPRESSION: 1. No acute intracranial abnormality. 2. Similar appearance of atrophy and advanced chronic microvascular ischemic white matter disease.   Electronically Signed   By: Jacqulynn Cadet M.D.   On: 11/29/2014 21:01     Assessment/Plan:  80y/o woman HTN, hypothyroidism, CAD s/p stent x 2 presenting with acute onset of expressive aphasia and confusion. Symptoms appear to be improving though she is still not at her baseline. CVA/TIA would be in the differential. She was found to have a UTI in the ED which may be contributing to her symptoms.   1. HgbA1c, fasting lipid panel 2. MRI, MRA  of the brain without contrast 3. Frequent neuro checks 4. Echocardiogram 5. Carotid dopplers 6. Prophylactic therapy-ASA 325mg  daily 7. Risk factor modification 8.  Telemetry monitoring 9. PT consult, OT consult, Speech consult 10. NPO until RN stroke swallow screen 11. UTI treatment per primary team    Jim Like, DO [facility] [phone]  If 7pm- 7am, please page neurology on call as listed in [place]. 11/29/2014, 11:00 PM

## 2014-11-29 NOTE — H&P (Signed)
Hospitalist Admission History and Physical  Patient name: Vicki Bauer Medical record number: [id number] Date of birth: Oct 30, 1928 Age: 80 y.o. Gender: female  Primary Care Provider: Lilian Coma, MD  Chief Complaint: CVA, UTI  History of Present Illness:This is a 80 y.o. year old female with significant past medical history of HTN, CAD s/p stents, hx/o lymphoma s/p lymphatic resection presenting with CVA, UTI Pt's son came home today around 6:15 and noticed pt w/ some word finding difficulties. Last remembers pt being normal yesterday evening. Pt then called EMS. No prior hx/o CVA.  Presented to ER hemodynamically stable, afebrile. CBC and BMET WNL apart from K 2.3. UA indicative of infection. Head CT WNL. EKG NSR. Still with expressive aphasia. Neuro consulted w/ recommendation of inpt stroke workup. Family denies any polyuria, dysuria.   Assessment and Plan: Vicki Bauer is a 80 y.o. year old female presenting with CVA, UTI   Active Problems:   Expressive aphasia   CVA (cerebral infarction)   1- CVA -proceed down stroke workup including MRI, MRA, 2D ECHO, carotid dopplers, risk stratification labs -start ASA -f/u neuro recs  2- UTI  -IV rocephin  -urine culture  3- HTN/CAD -BP stable  -no active CP  -cont home regimen  -tele bed  -follow   4- Hypokalemia  -replete  -check mag level  -hold home lasix  FEN/GI: heart healthy diet  Prophylaxis: lovenox  Disposition: pending further evaluation Code Status: Full Code    Patient Active Problem List   Diagnosis Date Noted  . Expressive aphasia 11/29/2014  . CVA (cerebral infarction) 11/29/2014   Past Medical History: Past Medical History  Diagnosis Date  . Hypertension   . Hypothyroidism   . Macular degeneration   . Cancer     lymphoma  . Insomnia   . OAB (overactive bladder)   . CAD (coronary artery disease) 1990    s/p stent x2,    Past Surgical History: Past Surgical History   Procedure Laterality Date  . Tonsillectomy and adenoidectomy    . Hysterotomy    . Replacement total knee bilateral    . Lymph gland excision Bilateral   . Cholecystectomy    . Cataract extraction Bilateral   . Appendectomy    . Ptca    . Back surgery      Social History: History   Social History  . Marital Status: Single    Spouse Name: N/A    Number of Children: 1  . Years of Education: BA   Occupational History  . Retired    Social History Main Topics  . Smoking status: Former Smoker -- 1.00 packs/day    Types: Cigarettes    Quit date: 12/09/1973  . Smokeless tobacco: Never Used  . Alcohol Use: No     Comment: quit: 1982  . Drug Use: No  . Sexual Activity: Not Currently   Other Topics Concern  . None   Social History Narrative   Patient lives at home with son.   Caffeine Use: 1-2 12 oz sodas daily    Family History: Family History  Problem Relation Age of Onset  . Arthritis Mother   . Thyroid disease Son   . Breast cancer Sister     Allergies: Allergies  Allergen Reactions  . Demerol [Meperidine] Other (See Comments)    Turned patient blue and couldn't breathe  . Morphine And Related Other (See Comments)    Turned patient blue and couldn't breathe   . Macrolides And  Ketolides Nausea Only    Current Facility-Administered Medications  Medication Dose Route Frequency Provider Last Rate Last Dose  .  stroke: mapping our early stages of recovery book   Does not apply Once Shanda Howells, MD      . Derrill Memo ON 11/30/2014] aspirin suppository 300 mg  300 mg Rectal Daily Shanda Howells, MD       Or  . Derrill Memo ON 11/30/2014] aspirin tablet 325 mg  325 mg Oral Daily Shanda Howells, MD      . Derrill Memo ON 11/30/2014] bisoprolol (ZEBETA) tablet 5 mg  5 mg Oral Daily Shanda Howells, MD      . buPROPion [facility]  SR) 12 hr tablet 200 mg  200 mg Oral BID Shanda Howells, MD      . Derrill Memo ON 11/30/2014] CENTRUM SILVER ADULT 50+ TABS 1 tablet  1 tablet Oral Daily Shanda Howells, MD      . DULoxetine (CYMBALTA) DR capsule 60 mg  60 mg Oral BID Shanda Howells, MD      . Derrill Memo ON 11/30/2014] enoxaparin (LOVENOX) injection 30 mg  30 mg Subcutaneous Q24H Shanda Howells, MD      . Derrill Memo ON 11/30/2014] furosemide (LASIX) tablet 40 mg  40 mg Oral Daily Shanda Howells, MD      . Gabapentin (Once-Daily) TABS 1 tablet  1 tablet Oral QHS Shanda Howells, MD      . Derrill Memo ON 11/30/2014] levothyroxine (SYNTHROID, LEVOTHROID) tablet 25 mcg  25 mcg Oral QAC breakfast Shanda Howells, MD      . LORazepam (ATIVAN) tablet 1 mg  1 mg Oral QHS Shanda Howells, MD      . senna-docusate (Senokot-S) tablet 1 tablet  1 tablet Oral QHS PRN Shanda Howells, MD      . Derrill Memo ON 11/30/2014] Zinc CAPS 100 mg  100 mg Oral Daily Shanda Howells, MD      . zolpidem (AMBIEN) tablet 5 mg  5 mg Oral QHS Shanda Howells, MD       Current Outpatient Prescriptions  Medication Sig Dispense Refill  . bisoprolol (ZEBETA) 5 MG tablet Take 5 mg by mouth daily.    Marland Kitchen buPROPion (WELLBUTRIN SR) 200 MG 12 hr tablet Take 200 mg by mouth 2 (two) times daily.    . DULoxetine (CYMBALTA) 60 MG capsule Take 60 mg by mouth 2 (two) times daily.    . furosemide (LASIX) 40 MG tablet Take 40 mg by mouth daily.    . Gabapentin, PHN, (GRALISE) 600 MG TABS Take 1 tablet by mouth at bedtime.     Marland Kitchen levothyroxine (SYNTHROID, LEVOTHROID) 25 MCG tablet Take 25 mcg by mouth daily before breakfast.    . LORazepam (ATIVAN) 1 MG tablet Take 1 mg by mouth at bedtime.     . Multiple Vitamins-Minerals (CENTRUM SILVER ADULT 50+) TABS Take 1 tablet by mouth daily.    . pregabalin (LYRICA) 150 MG capsule Take 150 mg by mouth 2 (two) times daily.    . pregabalin (LYRICA) 50 MG capsule Take 50 mg by mouth 3 (three) times daily.    . Simethicone (GAS-X ULTRA STRENGTH) 180 MG CAPS Take 180 mg by mouth at bedtime.    . Zinc 50 MG CAPS Take 100 mg by mouth daily.     Marland Kitchen zolpidem (AMBIEN) 5 MG tablet Take 5 mg by mouth at bedtime.     Review Of Systems:  12 point ROS negative except as noted above in HPI.  Physical Exam: Filed Vitals:  11/29/14 2300  BP: 171/47  Pulse: 68  Temp:   Resp: 17    General: alert and cooperative HEENT: PERRLA and extra ocular movement intact Heart: S1, S2 normal, no murmur, rub or gallop, regular rate and rhythm Lungs: clear to auscultation, no wheezes or rales and unlabored breathing Abdomen: abdomen is soft without significant tenderness, masses, organomegaly or guarding Extremities: 2+ peripheral pulses, R shoulder pain-chronic, L forearm scar healing well , no signs of infection Skin:as above  Neurology: expressive aphasia, otherwise grossly normal   Labs and Imaging: Lab Results  Component Value Date/Time   NA 140 11/29/2014 08:10 PM   K 2.3* 11/29/2014 08:10 PM   CL 99 11/29/2014 08:10 PM   CO2 31 11/29/2014 08:10 PM   BUN 11 11/29/2014 08:10 PM   CREATININE 0.75 11/29/2014 08:10 PM   GLUCOSE 91 11/29/2014 08:10 PM   Lab Results  Component Value Date   WBC 5.0 11/29/2014   HGB 12.5 11/29/2014   HCT 39.4 11/29/2014   MCV 84.9 11/29/2014   PLT 100* 11/29/2014   Urinalysis    Component Value Date/Time   COLORURINE AMBER* 11/29/2014 2034   APPEARANCEUR CLOUDY* 11/29/2014 2034   LABSPEC 1.017 11/29/2014 2034   PHURINE 6.0 11/29/2014 2034   GLUCOSEU NEGATIVE 11/29/2014 2034   HGBUR TRACE* 11/29/2014 2034   BILIRUBINUR NEGATIVE 11/29/2014 2034   KETONESUR NEGATIVE 11/29/2014 2034   PROTEINUR NEGATIVE 11/29/2014 2034   UROBILINOGEN 0.2 11/29/2014 2034   NITRITE POSITIVE* 11/29/2014 2034   LEUKOCYTESUR LARGE* 11/29/2014 2034       Ct Head Wo Contrast  11/29/2014   CLINICAL DATA:  80 year old female with altered mental status  EXAM: CT HEAD WITHOUT CONTRAST  TECHNIQUE: Contiguous axial images were obtained from the base of the skull through the vertex without intravenous contrast.  COMPARISON:  Prior head CT 09/20/2014  FINDINGS: Negative for acute intracranial hemorrhage, acute  infarction, mass, mass effect, hydrocephalus or midline shift. Gray-white differentiation is preserved throughout. Similar pattern of cerebral and cerebellar atrophy and advanced chronic microvascular ischemic white matter disease. No focal soft tissue or calvarial abnormality. Globes and orbits are intact and unremarkable bilaterally. Normal aeration of the mastoid air cells and paranasal sinuses. Intracranial atherosclerosis of the bilateral cavernous carotid arteries. Hyperostosis frontalis interna.  IMPRESSION: 1. No acute intracranial abnormality. 2. Similar appearance of atrophy and advanced chronic microvascular ischemic white matter disease.   Electronically Signed   By: Jacqulynn Cadet M.D.   On: 11/29/2014 21:01           Shanda Howells MD  Pager: [phone]

## 2014-11-29 NOTE — ED Provider Notes (Signed)
CSN: [id number]     Arrival date & time 11/29/14  1941 History   First MD Initiated Contact with Patient 11/29/14 1958     Chief Complaint  Patient presents with  . Altered Mental Status     (Consider location/radiation/quality/duration/timing/severity/associated sxs/prior Treatment) Patient is a 80 y.o. female presenting with altered mental status. The history is provided by the patient and medical records.  Altered Mental Status   This is an 80 y.o. F with PMH significant for HTN. Hypothyroidism, OAB, CAD s/p stenting x2, presenting to the ED for AMS and expressive aphasia.  Patient was last seen normal by her son around 2000 yesterday evening.  Patient states earlier this afternoon she was talking with her son and felt as though she could not get any words out. She states "i know what i want to say, but i can't seem to form the sentences."  She denies any difficulty understanding anyone else.  Denies recent head injury, trauma, falls.  No prior hx of TIA or stroke.  Patient not currently on anti-coagulation.  She denies numbness, paresthesias, or focal weakness.  Her only physical complaint is that her right upper back itches.  No fever or chills.  Denies chest pain or SOB.  VS stable on arrival.  Past Medical History  Diagnosis Date  . Hypertension   . Hypothyroidism   . Macular degeneration   . Cancer     lymphoma  . Insomnia   . OAB (overactive bladder)   . CAD (coronary artery disease) 1990    s/p stent x2,   Past Surgical History  Procedure Laterality Date  . Tonsillectomy and adenoidectomy    . Hysterotomy    . Replacement total knee bilateral    . Lymph gland excision Bilateral   . Cholecystectomy    . Cataract extraction Bilateral   . Appendectomy    . Ptca    . Back surgery     Family History  Problem Relation Age of Onset  . Arthritis Mother   . Thyroid disease Son   . Breast cancer Sister    History  Substance Use Topics  . Smoking status: Former Smoker  -- 1.00 packs/day    Types: Cigarettes    Quit date: 12/09/1973  . Smokeless tobacco: Never Used  . Alcohol Use: No     Comment: quit: 1982   OB History    No data available     Review of Systems  Neurological: Positive for speech difficulty.       Expressive aphasia  All other systems reviewed and are negative.     Allergies  Demerol and Morphine and related  Home Medications   Prior to Admission medications   Medication Sig Start Date End Date Taking? Authorizing Provider  bisoprolol (ZEBETA) 5 MG tablet Take 5 mg by mouth daily.    Historical Provider, MD  buPROPion (WELLBUTRIN SR) 200 MG 12 hr tablet Take 200 mg by mouth 2 (two) times daily.    Historical Provider, MD  DULoxetine (CYMBALTA) 60 MG capsule Take 60 mg by mouth 2 (two) times daily.    Historical Provider, MD  furosemide (LASIX) 40 MG tablet Take 40 mg by mouth daily.    Historical Provider, MD  Gabapentin, PHN, (GRALISE) 600 MG TABS Take 1 tablet by mouth daily.    Historical Provider, MD  levothyroxine (SYNTHROID, LEVOTHROID) 25 MCG tablet Take 25 mcg by mouth daily before breakfast.    Historical Provider, MD  LORazepam (ATIVAN) 1  MG tablet Take 1 mg by mouth daily.     Historical Provider, MD  Multiple Vitamins-Minerals (CENTRUM SILVER ADULT 50+) TABS Take 1 tablet by mouth daily.    Historical Provider, MD  oxyCODONE-acetaminophen (PERCOCET) 10-325 MG per tablet Take 1 tablet by mouth every 4 (four) hours as needed for pain.    Historical Provider, MD  potassium chloride (KLOR-CON) 20 MEQ packet Take 20 mEq by mouth daily.    Historical Provider, MD  pregabalin (LYRICA) 50 MG capsule Take 50 mg by mouth 3 (three) times daily.    Historical Provider, MD  Zinc 50 MG CAPS Take 100 mg by mouth daily.     Historical Provider, MD   BP 146/76 mmHg  Pulse 71  Temp(Src) 98.5 F (36.9 C) (Oral)  Resp 14  SpO2 93%   Physical Exam  Constitutional: She is oriented to person, place, and time. She appears  well-developed and well-nourished.  HENT:  Head: Normocephalic and atraumatic.  Mouth/Throat: Oropharynx is clear and moist.  Eyes: Conjunctivae and EOM are normal. Pupils are equal, round, and reactive to light.  Neck: Normal range of motion.  Cardiovascular: Normal rate, regular rhythm and normal heart sounds.   Pulmonary/Chest: Effort normal and breath sounds normal.  Abdominal: Soft. Bowel sounds are normal.  Musculoskeletal: Normal range of motion.  Neurological: She is alert and oriented to person, place, and time.  AAOx3, expressive aphasia noted with few inappropriate words; equal strength UE and LE bilaterally; CN grossly intact; moves all extremities appropriately without ataxia; no facial asymmetry, normal coordination bilaterally, negative pronator drift  Skin: Skin is warm and dry.  Some hypopigmentation of skin of right upper back; some areas of dry skin without visible rash  Psychiatric: She has a normal mood and affect.  Nursing note and vitals reviewed.   ED Course  Procedures (including critical care time) Labs Review Labs Reviewed  CBC - Abnormal; Notable for the following:    Platelets 100 (*)    All other components within normal limits  COMPREHENSIVE METABOLIC PANEL - Abnormal; Notable for the following:    Potassium 2.3 (*)    Total Protein 5.3 (*)    Albumin 3.4 (*)    GFR calc non Af Amer 76 (*)    GFR calc Af Amer 88 (*)    All other components within normal limits  URINE RAPID DRUG SCREEN (HOSP PERFORMED) - Abnormal; Notable for the following:    Benzodiazepines POSITIVE (*)    All other components within normal limits  URINALYSIS, ROUTINE W REFLEX MICROSCOPIC - Abnormal; Notable for the following:    Color, Urine AMBER (*)    APPearance CLOUDY (*)    Hgb urine dipstick TRACE (*)    Nitrite POSITIVE (*)    Leukocytes, UA LARGE (*)    All other components within normal limits  URINE MICROSCOPIC-ADD ON - Abnormal; Notable for the following:     Squamous Epithelial / LPF FEW (*)    Bacteria, UA MANY (*)    All other components within normal limits  URINE CULTURE  ETHANOL  PROTIME-INR  APTT  DIFFERENTIAL  I-STAT TROPOININ, ED    Imaging Review Ct Head Wo Contrast  11/29/2014   CLINICAL DATA:  80 year old female with altered mental status  EXAM: CT HEAD WITHOUT CONTRAST  TECHNIQUE: Contiguous axial images were obtained from the base of the skull through the vertex without intravenous contrast.  COMPARISON:  Prior head CT 09/20/2014  FINDINGS: Negative for acute intracranial hemorrhage,  acute infarction, mass, mass effect, hydrocephalus or midline shift. Gray-white differentiation is preserved throughout. Similar pattern of cerebral and cerebellar atrophy and advanced chronic microvascular ischemic white matter disease. No focal soft tissue or calvarial abnormality. Globes and orbits are intact and unremarkable bilaterally. Normal aeration of the mastoid air cells and paranasal sinuses. Intracranial atherosclerosis of the bilateral cavernous carotid arteries. Hyperostosis frontalis interna.  IMPRESSION: 1. No acute intracranial abnormality. 2. Similar appearance of atrophy and advanced chronic microvascular ischemic white matter disease.   Electronically Signed   By: Jacqulynn Cadet M.D.   On: 11/29/2014 21:01     EKG Interpretation   Date/Time:  Tuesday November 29 2014 19:45:35 EST Ventricular Rate:  71 PR Interval:  169 QRS Duration: 89 QT Interval:  460 QTC Calculation: 500 R Axis:   -38 Text Interpretation:  Sinus rhythm Left axis deviation Borderline T  abnormalities, anterior leads Borderline prolonged QT interval No previous  tracing Confirmed by POLLINA  MD, CHRISTOPHER [phone]) on 11/29/2014  8:25:20 PM      MDM   Final diagnoses:  Expressive aphasia  Hypokalemia  UTI (lower urinary tract infection)   80 year old female with expressive aphasia, currently improving.  No prior history of TIA or stroke. Not  currently on anticoagulation. On exam, patient continues to have occasional expressive aphasia and obvious frustration with this. She has occasional inappropriate wording as well.  Last known well was last night around 8 PM, CODE stroke not activated but will proceed with stroke work up.  EKG NSR.  Labwork with noted hypokalemia, son reports she has not been taking her potassium supplements with her diuretics as directed. Nitrite positive UTI, culture pending.  Remainder of lab work largely unremarkable. CT head negative for acute findings.  Patient given potassium supplementation and started on Rocephin for UTI. Neurology, Dr. Janann Colonel, was consulted and has evaluated patient in the ED-- will plan for MRI in the morning.  Case discussed with hospitalist, Dr. Ernestina Patches, who will admit to telemetry.  Temp admission orders placed, VS remain stable.  Larene Pickett, PA-C 11/29/14 [place], MD 11/29/14 [phone]

## 2014-11-30 ENCOUNTER — Other Ambulatory Visit (HOSPITAL_COMMUNITY): Payer: 59

## 2014-11-30 ENCOUNTER — Observation Stay (HOSPITAL_COMMUNITY): Payer: MEDICARE

## 2014-11-30 ENCOUNTER — Encounter (HOSPITAL_COMMUNITY): Payer: Self-pay | Admitting: *Deleted

## 2014-11-30 DIAGNOSIS — N390 Urinary tract infection, site not specified: Secondary | ICD-10-CM

## 2014-11-30 DIAGNOSIS — Z7982 Long term (current) use of aspirin: Secondary | ICD-10-CM | POA: Diagnosis not present

## 2014-11-30 DIAGNOSIS — B9620 Unspecified Escherichia coli [E. coli] as the cause of diseases classified elsewhere: Secondary | ICD-10-CM | POA: Diagnosis present

## 2014-11-30 DIAGNOSIS — I2510 Atherosclerotic heart disease of native coronary artery without angina pectoris: Secondary | ICD-10-CM | POA: Diagnosis present

## 2014-11-30 DIAGNOSIS — I639 Cerebral infarction, unspecified: Secondary | ICD-10-CM

## 2014-11-30 DIAGNOSIS — Z9841 Cataract extraction status, right eye: Secondary | ICD-10-CM | POA: Diagnosis not present

## 2014-11-30 DIAGNOSIS — R4701 Aphasia: Secondary | ICD-10-CM | POA: Diagnosis present

## 2014-11-30 DIAGNOSIS — F0390 Unspecified dementia without behavioral disturbance: Secondary | ICD-10-CM | POA: Diagnosis present

## 2014-11-30 DIAGNOSIS — Z8572 Personal history of non-Hodgkin lymphomas: Secondary | ICD-10-CM | POA: Diagnosis not present

## 2014-11-30 DIAGNOSIS — G9341 Metabolic encephalopathy: Secondary | ICD-10-CM | POA: Diagnosis not present

## 2014-11-30 DIAGNOSIS — Z9842 Cataract extraction status, left eye: Secondary | ICD-10-CM | POA: Diagnosis not present

## 2014-11-30 DIAGNOSIS — Z886 Allergy status to analgesic agent status: Secondary | ICD-10-CM | POA: Diagnosis not present

## 2014-11-30 DIAGNOSIS — H3530 Unspecified macular degeneration: Secondary | ICD-10-CM | POA: Diagnosis present

## 2014-11-30 DIAGNOSIS — G4700 Insomnia, unspecified: Secondary | ICD-10-CM | POA: Diagnosis present

## 2014-11-30 DIAGNOSIS — E039 Hypothyroidism, unspecified: Secondary | ICD-10-CM

## 2014-11-30 DIAGNOSIS — Z9049 Acquired absence of other specified parts of digestive tract: Secondary | ICD-10-CM | POA: Diagnosis present

## 2014-11-30 DIAGNOSIS — I10 Essential (primary) hypertension: Secondary | ICD-10-CM | POA: Diagnosis present

## 2014-11-30 DIAGNOSIS — Z90710 Acquired absence of both cervix and uterus: Secondary | ICD-10-CM | POA: Diagnosis not present

## 2014-11-30 DIAGNOSIS — Z955 Presence of coronary angioplasty implant and graft: Secondary | ICD-10-CM | POA: Diagnosis not present

## 2014-11-30 DIAGNOSIS — Z87891 Personal history of nicotine dependence: Secondary | ICD-10-CM | POA: Diagnosis not present

## 2014-11-30 DIAGNOSIS — N3281 Overactive bladder: Secondary | ICD-10-CM | POA: Diagnosis present

## 2014-11-30 DIAGNOSIS — I5032 Chronic diastolic (congestive) heart failure: Secondary | ICD-10-CM

## 2014-11-30 DIAGNOSIS — E876 Hypokalemia: Secondary | ICD-10-CM | POA: Diagnosis present

## 2014-11-30 LAB — CREATININE, SERUM
Creatinine, Ser: 0.79 mg/dL (ref 0.50–1.10)
GFR calc Af Amer: 86 mL/min — ABNORMAL LOW (ref >90)
GFR calc non Af Amer: 74 mL/min — ABNORMAL LOW (ref >90)

## 2014-11-30 LAB — HEMOGLOBIN A1C
Hgb A1c MFr Bld: 5.3 % (ref <5.7)
Mean Plasma Glucose: 105 mg/dL (ref <117)

## 2014-11-30 LAB — LIPID PANEL
CHOLESTEROL: 148 mg/dL (ref 0–200)
HDL: 32 mg/dL — AB (ref >39)
LDL Cholesterol: 84 mg/dL (ref 0–99)
Total CHOL/HDL Ratio: 4.6 RATIO
Triglycerides: 160 mg/dL — ABNORMAL HIGH (ref <150)
VLDL: 32 mg/dL (ref 0–40)

## 2014-11-30 MED ORDER — HYDROMORPHONE HCL 1 MG/ML IJ SOLN
0.2500 mg | INTRAMUSCULAR | Status: DC | PRN
  Administered 2014-11-30: 0.25 mg via INTRAVENOUS
  Filled 2014-11-30: qty 1

## 2014-11-30 MED ORDER — GABAPENTIN ENACARBIL ER 600 MG PO TBCR
600.0000 mg | EXTENDED_RELEASE_TABLET | Freq: Every day | ORAL | Status: DC
  Administered 2014-11-30: 600 mg via ORAL
  Filled 2014-11-30: qty 1

## 2014-11-30 MED ORDER — CIPROFLOXACIN HCL 500 MG PO TABS: 500.0000 mg | ORAL_TABLET | Freq: Two times a day (BID) | ORAL | Status: DC

## 2014-11-30 MED ORDER — ENOXAPARIN SODIUM 40 MG/0.4ML ~~LOC~~ SOLN
40.0000 mg | SUBCUTANEOUS | Status: DC
  Administered 2014-12-01: 40 mg via SUBCUTANEOUS
  Filled 2014-11-30: qty 0.4

## 2014-11-30 MED ORDER — GABAPENTIN 300 MG PO CAPS
300.0000 mg | ORAL_CAPSULE | Freq: Once | ORAL | Status: AC
  Administered 2014-11-30: 300 mg via ORAL
  Filled 2014-11-30: qty 1

## 2014-11-30 NOTE — Progress Notes (Signed)
Patient's son was called and asked to bring in Gabapentin from home since the kind she is on we do not carry here. Son stated that he would either bring by before work or after he got off work. Joaquin Bend E, [place] 11/30/2014 [phone]

## 2014-11-30 NOTE — Evaluation (Signed)
Speech Language Pathology Evaluation Patient Details Name: Vicki Bauer MRN: [id number] DOB: 11-Jan-1928 Today's Date: 11/30/2014 Time: 0300-0325 SLP Time Calculation (min) (ACUTE ONLY): 25 min  Problem List:  Patient Active Problem List   Diagnosis Date Noted  . Expressive aphasia 11/29/2014  . CVA (cerebral infarction) 11/29/2014  . UTI (lower urinary tract infection) 11/29/2014   Past Medical History:  Past Medical History  Diagnosis Date  . Hypertension   . Hypothyroidism   . Macular degeneration   . Cancer     lymphoma  . Insomnia   . OAB (overactive bladder)   . CAD (coronary artery disease) 1990    s/p stent x2,   Past Surgical History:  Past Surgical History  Procedure Laterality Date  . Tonsillectomy and adenoidectomy    . Hysterotomy    . Replacement total knee bilateral    . Lymph gland excision Bilateral   . Cholecystectomy    . Cataract extraction Bilateral   . Appendectomy    . Ptca    . Back surgery     HPI:  Pt is a 80 y.o. year old female with significant past medical history of HTN, CAD s/p stents, hx/o lymphoma s/p lymphatic resection presenting with CVA, UTI Pt's son came home on 11/29/14 around 6:15 and noticed pt w/ some word finding difficulties. MRI on 11/30/14 revealed no acute intracranial infarct or other abnormality identified   Assessment / Plan / Recommendation Clinical Impression   Pt presents with a mild-moderate expressive aphasia primarily with anomia during conversational tasks, divergent and convergent tasks and automatic and responsive naming tasks; auditory comprehension and cognition appear wfl, with possible memory component, but this may have been present before hospital admission per son.  Pt has made improvements over last 24 hrs and MRI revealed no acute abnormality, so this may be related to UTI dx.  ST to f/u if needed for anomia if this does not resolve.    SLP Assessment  Patient needs continued Speech Language  Pathology Services    Follow Up Recommendations  Home health SLP prn    Frequency and Duration min 2x/week  1 week   Pertinent Vitals/Pain Pain Assessment: No/denies pain   SLP Goals  Potential to Achieve Goals (ACUTE ONLY): Good  SLP Evaluation Prior Functioning  Cognitive/Linguistic Baseline: Baseline deficits Baseline deficit details: Son reports his Mother's memory has faded somewhat over the years, but feels this is due to "old age." Type of Home: House  Lives With: Son Available Help at Discharge: Family;Available PRN/intermittently (son states he is off work Architectural technologist) Vocation: Retired   Clinical biochemist Status: Difficult to assess Arousal/Alertness: Awake/alert Orientation Level: Oriented X4 Memory: Impaired Memory Impairment: Other (comment) (anomia) Awareness: Appears intact Problem Solving: Appears intact Safety/Judgment: Appears intact    Comprehension  Auditory Comprehension Overall Auditory Comprehension: Appears within functional limits for tasks assessed Yes/No Questions: Within Functional Limits Commands: Within Functional Limits Visual Recognition/Discrimination Discrimination: Not tested Reading Comprehension Reading Status: Not tested    Expression Expression Primary Mode of Expression: Verbal Verbal Expression Overall Verbal Expression: Impaired Initiation: No impairment Level of Generative/Spontaneous Verbalization: Sentence Repetition: No impairment Naming: Impairment Responsive: 76-100% accurate Confrontation: Within functional limits Convergent: 0-24% accurate Divergent: 0-24% accurate Pragmatics: No impairment Interfering Components: Other (comment) (Aphasia) Effective Techniques: Semantic cues;Phonemic cues;Articulatory cues Non-Verbal Means of Communication: Not applicable Written Expression Written Expression: Not tested   Oral / Motor Oral Motor/Sensory Function Overall Oral Motor/Sensory Function: Appears within  functional limits for tasks  assessed Motor Speech Overall Motor Speech: Appears within functional limits for tasks assessed Respiration: Within functional limits Phonation: Normal Resonance: Within functional limits Articulation: Within functional limitis Intelligibility: Intelligible Motor Planning: Witnin functional limits Motor Speech Errors: Not applicable        Vicki Bauer,PAT, M.S., CCC-SLP 11/30/2014, 4:52 PM

## 2014-11-30 NOTE — Progress Notes (Signed)
Pt. Arrived from ED with one nurse tech. VSS and reported no pain. Will continue to monitor. Joaquin Bend E, [place] 11/30/2014 0010

## 2014-11-30 NOTE — Progress Notes (Signed)
UR completed 

## 2014-11-30 NOTE — Evaluation (Signed)
Physical Therapy Evaluation Patient Details Name: Vicki Bauer MRN: [id number] DOB: 1928/08/04 Today's Date: 11/30/2014   History of Present Illness  Patient is a 80 y/o female with PMH of HTN, CAD s/p stents, hx of lymphoma s/p lymphatic resection presenting with suspected CVA and UTI. Son returned home from work and noticed pt w/ some word finding difficulties. Head CT-unremarkable. UA indicative of infection.     Clinical Impression  Patient presents with functional limitations due to deficits listed in PT problem list (see below). Pt with new expressive aphasia, generalized weakness and balance deficits impacting safe mobility. Difficult obtaining history due to aphasia. Pt will need 24/7 supervision during mobility for safety. Pt would benefit from skilled PT to improve transfers, gait, balance and mobility so pt can maximize independence and return to PLOF.    Follow Up Recommendations Home health PT;Supervision/Assistance - 24 hour    Equipment Recommendations  None recommended by PT    Recommendations for Other Services OT consult     Precautions / Restrictions Precautions Precautions: Fall Restrictions Weight Bearing Restrictions: No      Mobility  Bed Mobility Overal bed mobility: Needs Assistance Bed Mobility: Supine to Sit     Supine to sit: Supervision;HOB elevated     General bed mobility comments: Supervision for safety. Use of rails for support. Increased time and cues.   Transfers Overall transfer level: Needs assistance Equipment used: Rolling walker (2 wheeled) Transfers: Sit to/from Stand Sit to Stand: Min guard         General transfer comment: Min guard for safety. Cues for hand placement.  Ambulation/Gait Ambulation/Gait assistance: Min guard Ambulation Distance (Feet): 8 Feet Assistive device: Rolling walker (2 wheeled) Gait Pattern/deviations: Step-through pattern;Decreased stride length;Trunk flexed     General Gait Details: Pt  with slow, unsteady gait. Cues for upright posture.   Stairs            Wheelchair Mobility    Modified Rankin (Stroke Patients Only)       Balance Overall balance assessment: Needs assistance Sitting-balance support: Feet supported;No upper extremity supported Sitting balance-Leahy Scale: Fair     Standing balance support: During functional activity Standing balance-Leahy Scale: Poor Standing balance comment: requires BUE support on RW for balance/safety.                             Pertinent Vitals/Pain Pain Assessment: No/denies pain    Home Living Family/patient expects to be discharged to:: Private residence Living Arrangements: Children Available Help at Discharge: Family;Available PRN/intermittently Type of Home: House Home Access: Stairs to enter Entrance Stairs-Rails: Right Entrance Stairs-Number of Steps: 1+ 2 Home Layout: One level Home Equipment: Walker - 2 wheels;Shower seat;Grab bars - tub/shower      Prior Function Level of Independence: Needs assistance   Gait / Transfers Assistance Needed: Pt using RW for ambulation. Reports falls at home.   ADL's / Homemaking Assistance Needed: Pt reports (I) for ADLs but expressed getting some help for bathing since pt is fearful of falling. Son does all IADLs - cooking/cleaning.  Comments: Pt has life alert necklace.     Hand Dominance        Extremity/Trunk Assessment   Upper Extremity Assessment: Defer to OT evaluation           Lower Extremity Assessment: RLE deficits/detail;LLE deficits/detail;Generalized weakness         Communication   Communication: Expressive difficulties  Cognition Arousal/Alertness: Awake/alert  Behavior During Therapy: WFL for tasks assessed/performed Overall Cognitive Status: No family/caregiver present to determine baseline cognitive functioning                 General Comments: Pt has difficulty with word finding as evident when trying to  obtain social history.    General Comments      Exercises        Assessment/Plan    PT Assessment Patient needs continued PT services  PT Diagnosis Difficulty walking;Generalized weakness   PT Problem List Decreased strength;Impaired sensation;Decreased balance;Decreased mobility  PT Treatment Interventions Balance training;Gait training;Patient/family education;Functional mobility training;Therapeutic activities;Therapeutic exercise;Stair training;Neuromuscular re-education   PT Goals (Current goals can be found in the Care Plan section) Acute Rehab PT Goals Patient Stated Goal: to return home PT Goal Formulation: With patient Time For Goal Achievement: 12/14/14 Potential to Achieve Goals: Good    Frequency Min 3X/week   Barriers to discharge Decreased caregiver support Pt reports her son works during the day? "it is hard to explain."    Co-evaluation               End of Session Equipment Utilized During Treatment: Gait belt Activity Tolerance: Patient tolerated treatment well Patient left: in chair;with call bell/phone within reach;with chair alarm set Nurse Communication: Mobility status    Functional Assessment Tool Used: Clinical judgment Functional Limitation: Mobility: Walking and moving around Mobility: Walking and Moving Around Current Status [phone]): At least 1 percent but less than 20 percent impaired, limited or restricted Mobility: Walking and Moving Around Goal Status [phone]): At least 1 percent but less than 20 percent impaired, limited or restricted    Time: 1148-1205 PT Time Calculation (min) (ACUTE ONLY): 17 min   Charges:   PT Evaluation $Initial PT Evaluation Tier I: 1 Procedure PT Treatments $Therapeutic Activity: 8-22 mins   PT G Codes:   Functional Assessment Tool Used: Clinical judgment Functional Limitation: Mobility: Walking and moving around    Vicki Bauer, [place] A 11/30/2014, 1:04 PM Candy Sledge, PT, DPT [phone]

## 2014-11-30 NOTE — Progress Notes (Signed)
STROKE TEAM PROGRESS NOTE   HISTORY Vicki Bauer is an 80 y.o. female with PMH significant for HTN. Hypothyroidism, OAB, CAD s/p stenting x2, presenting to the ED for AMS and expressive aphasia. Patient was last seen normal by her son around 28 yesterday evening 11/28/2014. Son states he talked with her today and she appeared to have a lot of difficulty getting her words out. Did not always make sense. Symptoms have improved but she continues to have expressive aphasia. She denies any difficulty understanding anyone else. Denies recent head injury, trauma, falls. No prior hx of TIA or stroke. Patient not currently on anti-coagulation. She denies numbness, paresthesias, or focal weakness.  CT head imaging reviewed and no acute process. UA positive for UTI. Concern for acute stroke as cause of symptoms. Patient was not administered TPA secondary to delay in arrival. She was admitted for further evaluation and treatment.   SUBJECTIVE (INTERVAL HISTORY) No family at bedside. Pt conversing well but does not know why she is here in hospital. She did know that her son sent her to here as she was not feeling well. MRI no stroke and UTI found which likely to explain her AMS. No aphaisa on exam.    OBJECTIVE Temp:  [97.5 F (36.4 C)-98.1 F (36.7 C)] 97.5 F (36.4 C) (12/24 0245) Pulse Rate:  [64-86] 79 (12/24 0245) Cardiac Rhythm:  [-]  Resp:  [18-22] 22 (12/24 0245) BP: (101-161)/(50-70) 106/70 mmHg (12/24 0245) SpO2:  [98 %-100 %] 99 % (12/24 0245)  No results for input(s): GLUCAP in the last 168 hours.  Recent Labs Lab 11/29/14 2010 11/29/14 2330  NA 140  --   K 2.3*  --   CL 99  --   CO2 31  --   GLUCOSE 91  --   BUN 11  --   CREATININE 0.75 0.79  CALCIUM 8.8  --     Recent Labs Lab 11/29/14 2010  AST 28  ALT 18  ALKPHOS 93  BILITOT 1.0  PROT 5.3*  ALBUMIN 3.4*    Recent Labs Lab 11/29/14 2010 11/29/14 2330  WBC 5.0 5.3  NEUTROABS 2.9  --   HGB 12.5  11.8*  HCT 39.4 37.3  MCV 84.9 84.6  PLT 100* 106*   No results for input(s): CKTOTAL, CKMB, CKMBINDEX, TROPONINI in the last 168 hours.  Recent Labs  11/29/14 2010  LABPROT 13.2  INR 0.99    Recent Labs  11/29/14 2034  COLORURINE AMBER*  LABSPEC 1.017  PHURINE 6.0  GLUCOSEU NEGATIVE  HGBUR TRACE*  BILIRUBINUR NEGATIVE  KETONESUR NEGATIVE  PROTEINUR NEGATIVE  UROBILINOGEN 0.2  NITRITE POSITIVE*  LEUKOCYTESUR LARGE*       Component Value Date/Time   CHOL 148 11/30/2014 0525   TRIG 160* 11/30/2014 0525   HDL 32* 11/30/2014 0525   CHOLHDL 4.6 11/30/2014 0525   VLDL 32 11/30/2014 0525   LDLCALC 84 11/30/2014 0525   Lab Results  Component Value Date   HGBA1C 5.3 11/30/2014      Component Value Date/Time   LABOPIA NONE DETECTED 11/29/2014 2034   COCAINSCRNUR NONE DETECTED 11/29/2014 2034   LABBENZ POSITIVE* 11/29/2014 2034   AMPHETMU NONE DETECTED 11/29/2014 2034   THCU NONE DETECTED 11/29/2014 2034   LABBARB NONE DETECTED 11/29/2014 2034     Recent Labs Lab 11/29/14 2010  ETH <5    Dg Chest 2 View  11/30/2014   CLINICAL DATA:  Weakness and confusion, stroke  EXAM: CHEST  2 VIEW  COMPARISON:  05/28/2013  FINDINGS: The heart size and mediastinal contours are within normal limits. Patchy retrocardiac opacity is identified. No pleural effusion. The visualized skeletal structures are unremarkable.  IMPRESSION: Patchy retrocardiac opacity which could indicate early pneumonia although atelectasis could look similar.   Electronically Signed   By: Conchita Paris M.D.   On: 11/30/2014 01:14   Ct Head Wo Contrast  11/29/2014   CLINICAL DATA:  80 year old female with altered mental status  EXAM: CT HEAD WITHOUT CONTRAST  TECHNIQUE: Contiguous axial images were obtained from the base of the skull through the vertex without intravenous contrast.  COMPARISON:  Prior head CT 09/20/2014  FINDINGS: Negative for acute intracranial hemorrhage, acute infarction, mass, mass  effect, hydrocephalus or midline shift. Gray-white differentiation is preserved throughout. Similar pattern of cerebral and cerebellar atrophy and advanced chronic microvascular ischemic white matter disease. No focal soft tissue or calvarial abnormality. Globes and orbits are intact and unremarkable bilaterally. Normal aeration of the mastoid air cells and paranasal sinuses. Intracranial atherosclerosis of the bilateral cavernous carotid arteries. Hyperostosis frontalis interna.  IMPRESSION: 1. No acute intracranial abnormality. 2. Similar appearance of atrophy and advanced chronic microvascular ischemic white matter disease.   Electronically Signed   By: Jacqulynn Cadet M.D.   On: 11/29/2014 21:01   Mr Brain Wo Contrast  11/30/2014   CLINICAL DATA:  Word finding difficulty with right arm pain. New onset today. Initial encounter.  EXAM: MRI HEAD WITHOUT CONTRAST  MRA HEAD WITHOUT CONTRAST  TECHNIQUE: Multiplanar, multiecho pulse sequences of the brain and surrounding structures were obtained without intravenous contrast. Angiographic images of the head were obtained using MRA technique without contrast.  COMPARISON:  Prior CT from 11/29/2014.  FINDINGS: MRI HEAD FINDINGS  Diffuse prominence of the CSF containing spaces is compatible with generalized cerebral atrophy. Patchy and confluent T2/FLAIR hyperintensity within the periventricular and deep white matter both cerebral hemispheres is most consistent with chronic small vessel ischemic disease, moderate for patient age.  No abnormal foci of restricted diffusion to suggest acute intracranial infarct. Gray-white matter differentiation maintained. No intracranial hemorrhage.  No mass lesion or midline shift. No mass effect. Ventricles are normal in size without evidence of hydrocephalus. No extra-axial fluid collection.  Craniocervical junction within normal limits. Mild degenerative changes noted within the visualized upper cervical spine. Pituitary gland  within normal limits. No acute abnormality seen about the orbits.  Bone marrow signal intensity is within normal limits. No acute abnormality seen within the scalp.  Paranasal sinuses and mastoid air cells are clear.  MRA HEAD FINDINGS  ANTERIOR CIRCULATION:  The visualized distal cervical segments of the internal carotid arteries are widely patent with antegrade flow. The petrous, cavernous, and supra clinoid segments widely patent without hemodynamically significant stenosis. A1 segments, anterior communicating artery, and anterior cerebral arteries widely patent.  M1 segments well opacified bilaterally without hemodynamically significant stenosis or proximal branch occlusion. MCA bifurcations within normal limits. Mild distal branch atherosclerotic irregularity present within the MCA branches bilaterally.  POSTERIOR CIRCULATION:  The left vertebral artery is dominant. The right vertebral artery appears to terminate in the right posterior inferior cerebellar artery. The basilar artery is somewhat diminutive with moderate multi focal atherosclerotic irregularity. No focal high-grade stenosis. Superior cerebellar arteries patent bilaterally. P1 segments are markedly hypoplastic bilaterally with prominent posterior communicating arteries. No proximal branch occlusion seen within knee P2 segments. There is mild distal branch irregularity within the posterior cerebral arteries bilaterally.  No aneurysm or vascular malformation.  IMPRESSION: MRI HEAD  IMPRESSION:  1. No acute intracranial infarct or other abnormality identified. 2. Generalized cerebral atrophy with moderate chronic microvascular ischemic disease.  MRA HEAD IMPRESSION:  1. No proximal branch occlusion or focal hemodynamically significant stenosis identified within the intracranial circulation. 2. Somewhat diminutive basilar artery with moderate multi focal atherosclerotic irregularity. There are prominent posterior communicating arteries bilaterally. 3.  Moderate atherosclerotic irregularity within the distal MCA and PCA branches bilaterally.   Electronically Signed   By: Jeannine Boga M.D.   On: 11/30/2014 06:09   Mr Jodene Nam Head/brain Wo Cm  11/30/2014   CLINICAL DATA:  Word finding difficulty with right arm pain. New onset today. Initial encounter.  EXAM: MRI HEAD WITHOUT CONTRAST  MRA HEAD WITHOUT CONTRAST  TECHNIQUE: Multiplanar, multiecho pulse sequences of the brain and surrounding structures were obtained without intravenous contrast. Angiographic images of the head were obtained using MRA technique without contrast.  COMPARISON:  Prior CT from 11/29/2014.  FINDINGS: MRI HEAD FINDINGS  Diffuse prominence of the CSF containing spaces is compatible with generalized cerebral atrophy. Patchy and confluent T2/FLAIR hyperintensity within the periventricular and deep white matter both cerebral hemispheres is most consistent with chronic small vessel ischemic disease, moderate for patient age.  No abnormal foci of restricted diffusion to suggest acute intracranial infarct. Gray-white matter differentiation maintained. No intracranial hemorrhage.  No mass lesion or midline shift. No mass effect. Ventricles are normal in size without evidence of hydrocephalus. No extra-axial fluid collection.  Craniocervical junction within normal limits. Mild degenerative changes noted within the visualized upper cervical spine. Pituitary gland within normal limits. No acute abnormality seen about the orbits.  Bone marrow signal intensity is within normal limits. No acute abnormality seen within the scalp.  Paranasal sinuses and mastoid air cells are clear.  MRA HEAD FINDINGS  ANTERIOR CIRCULATION:  The visualized distal cervical segments of the internal carotid arteries are widely patent with antegrade flow. The petrous, cavernous, and supra clinoid segments widely patent without hemodynamically significant stenosis. A1 segments, anterior communicating artery, and anterior  cerebral arteries widely patent.  M1 segments well opacified bilaterally without hemodynamically significant stenosis or proximal branch occlusion. MCA bifurcations within normal limits. Mild distal branch atherosclerotic irregularity present within the MCA branches bilaterally.  POSTERIOR CIRCULATION:  The left vertebral artery is dominant. The right vertebral artery appears to terminate in the right posterior inferior cerebellar artery. The basilar artery is somewhat diminutive with moderate multi focal atherosclerotic irregularity. No focal high-grade stenosis. Superior cerebellar arteries patent bilaterally. P1 segments are markedly hypoplastic bilaterally with prominent posterior communicating arteries. No proximal branch occlusion seen within knee P2 segments. There is mild distal branch irregularity within the posterior cerebral arteries bilaterally.  No aneurysm or vascular malformation.  IMPRESSION: MRI HEAD IMPRESSION:  1. No acute intracranial infarct or other abnormality identified. 2. Generalized cerebral atrophy with moderate chronic microvascular ischemic disease.  MRA HEAD IMPRESSION:  1. No proximal branch occlusion or focal hemodynamically significant stenosis identified within the intracranial circulation. 2. Somewhat diminutive basilar artery with moderate multi focal atherosclerotic irregularity. There are prominent posterior communicating arteries bilaterally. 3. Moderate atherosclerotic irregularity within the distal MCA and PCA branches bilaterally.   Electronically Signed   By: Jeannine Boga M.D.   On: 11/30/2014 06:09   Carotid Doppler  There is 1-39% bilateral ICA stenosis. Vertebral artery flow is antegrade.    2D echo - pending  PHYSICAL EXAM  Temp:  [97.5 F (36.4 C)-98.1 F (36.7 C)] 97.5 F (36.4 C) (12/24 0245) Pulse Rate:  [  64-86] 79 (12/24 0245) Resp:  [18-22] 22 (12/24 0245) BP: (101-161)/(50-70) 106/70 mmHg (12/24 0245) SpO2:  [98 %-100 %] 99 % (12/24  0245)  General - Well nourished, well developed, in no apparent distress, but foul smell due to UTI.  Ophthalmologic - not cooperative on exam.  Cardiovascular - Regular rate and rhythm with no murmur.  Mental Status -  Awake alert but not orientated to time or place, but intact to self.  Language including expression, naming, repetition, comprehension was assessed and found intact, however, significant psychomotor slowing. Recent and remote memory were 0/3 for delayed recall.   Cranial Nerves II - XII - II - Visual field intact OU. III, IV, VI - Extraocular movements intact. V - Facial sensation intact bilaterally. VII - Facial movement intact bilaterally. VIII - Hearing & vestibular intact bilaterally. X - Palate elevates symmetrically. XI - Chin turning & shoulder shrug intact bilaterally. XII - Tongue protrusion intact.  Motor Strength - The patient's strength was 4+/5 in all extremities and pronator drift was absent.  Bulk was normal and fasciculations were absent.   Motor Tone - Muscle tone was assessed at the neck and appendages and was normal.  Reflexes - The patient's reflexes were normal in all extremities and she had no pathological reflexes.  Sensory - Light touch, temperature/pinprick were assessed and were normal.    Coordination - The patient had normal movements in the hands with no ataxia or dysmetria.  Tremor was absent.  Gait and Station - not tested due to fatigue.   ASSESSMENT/PLAN Ms. Vicki Bauer is a 80 y.o. female with history of HTN. Hypothyroidism, OAB, CAD s/p stenting x2 presenting with expressive aphasia. She did not receive IV t-PA due to delay in arrival.   AMS - secondary to UTI. No acute stroke.  Resultant  AMS resolved, but baseline dementia  MRI  No acute stroke  MRA  Unremarkable, no significant large vessel stenosis  Carotid Doppler  No significant stenosis   2D Echo  pending  LDL 84  HgbA1c 5.3  Lovenox 40 mg sq  daily for VTE prophylaxis Diet Heart  Diet - low sodium heart healthy thin liquids  no antithrombotic prior to admission, now on aspirin 325 mg orally every day  Therapy recommendations:  Home health PT  Disposition:  Home with home health therapies NO FURTHER STROKE WORKUP INDICATED Ongoing risk factor control by Primary Care Physician Stroke Service will sign off. Please call should any needs arise. No neurology follow-up indicated  UTI - on rocephine - urine culture showed E Coli - sensitivity pending - primary team to managem  Hypertension  Stable   Other Stroke Risk Factors  Advanced age   Coronary artery disease - status post stent 2  Other Active Problems  [facility] day # 2  Neurology will sign off. Please call with questions. No neurology follow up needed. Thanks for the consult.  Rosalin Hawking, MD PhD Stroke Neurology 12/01/2014 6:14 AM   To contact Stroke Continuity provider, please refer to [email or web address] After hours, contact General Neurology

## 2014-11-30 NOTE — Progress Notes (Signed)
Discharge orders in for patient to discharge today after 2Decho. Orders placed after 5pm and on call technician stated she can only do the 2Decho if a cardiologist place the order or call her directly. MD notified via pager. Patient and son aware of the situiation.

## 2014-11-30 NOTE — Progress Notes (Signed)
*  PRELIMINARY RESULTS* Vascular Ultrasound Carotid Duplex (Doppler) has been completed.  Findings suggest 1-39% internal carotid artery stenosis bilaterally. Vertebral arteries are patent with antegrade flow.  11/30/2014 2:02 PM Maudry Mayhew, RVT, RDCS, RDMS

## 2014-11-30 NOTE — Evaluation (Signed)
Occupational Therapy Evaluation Patient Details Name: Vicki Bauer MRN: [id number] DOB: Jul 16, 1928 Today's Date: 11/30/2014    History of Present Illness Patient is a 80 y/o female with PMH of HTN, CAD s/p stents, hx of lymphoma s/p lymphatic resection presenting with suspected CVA and UTI. Son returned home from work and noticed pt w/ some word finding difficulties. Head CT-unremarkable. UA indicative of infection.    Clinical Impression   Pt admitted with above. Pt independent with ADLs, PTA. Feel pt will benefit from acute OT to increase independence and strength/activity tolerance prior to d/c. Spoke with son/pt about d/c plans.    Follow Up Recommendations  Home health OT;Supervision/Assistance - 24 hour    Equipment Recommendations  3 in 1 bedside comode    Recommendations for Other Services       Precautions / Restrictions Precautions Precautions: Fall Restrictions Weight Bearing Restrictions: No      Mobility Bed Mobility Overal bed mobility: Needs Assistance Bed Mobility: Supine to Sit;Sit to Supine     Supine to sit: Min assist Sit to supine: Modified independent (Device/Increase time)   General bed mobility comments: cues for technique for supine to sit. cues how to scoot up in bed.  Transfers Overall transfer level: Needs assistance Equipment used: Rolling walker (2 wheeled) Transfers: Sit to/from Stand Sit to Stand: Min guard         General transfer comment: cues for hand placement.         ADL Overall ADL's : Needs assistance/impaired     Grooming: Wash/dry face;Applying deodorant;Set up;Supervision/safety;Standing   Upper Body Bathing: Set up;Supervision/ safety;Standing       Upper Body Dressing : Set up;Sitting   Lower Body Dressing: Min guard;Sit to/from stand   Toilet Transfer: Min guard;Ambulation;RW;Regular Toilet       Tub/ Shower Transfer: Minimal assistance;Ambulation;Tub transfer;Rolling walker   Functional  mobility during ADLs: Min guard;Minimal assistance;Rolling walker (Min A for tub transfer) General ADL Comments: Educated on safety tips (safe footwear, use of bag on walker, sitting for LB ADLs, recommended someone being with pt for tub transfer). Spoke with son about recommending someone staying with pt initially 24/7. Educated on uses of 3 in 1. practiced simulated tub transfer and recommended using wall to help with balance stepping over and not using grab bar as son states it isn't very stable.     Vision    Pt wears glasses all the time.                 Perception     Praxis      Pertinent Vitals/Pain Pain Assessment: Faces Faces Pain Scale: Hurts little more Pain Location: feet Pain Descriptors / Indicators: Burning Pain Intervention(s): Repositioned;Monitored during session     Hand Dominance     Extremity/Trunk Assessment Upper Extremity Assessment Upper Extremity Assessment: RUE deficits/detail RUE Deficits / Details: unable to achieve full AROM shoulder flexion but more than 90 degrees- popping noise with shoulder flexion   Lower Extremity Assessment Lower Extremity Assessment: Defer to PT evaluation RLE Sensation: history of peripheral neuropathy LLE Sensation: history of peripheral neuropathy       Communication Communication Communication: Expressive difficulties   Cognition Arousal/Alertness: Awake/alert Behavior During Therapy: WFL for tasks assessed/performed Overall Cognitive Status: Difficult to assess due to impaired communication (expressive difficulties)                    General Comments       Exercises  Shoulder Instructions      Home Living Family/patient expects to be discharged to:: Private residence Living Arrangements: Children Available Help at Discharge: Family;Available PRN/intermittently (son states he is off work Architectural technologist) Type of Home: [employer] Access: Stairs to enter Technical brewer of Steps: 1+  2 Entrance Stairs-Rails: Right Home Layout: One level     Bathroom Shower/Tub: Teacher, early years/pre: [place]: Environmental consultant - 2 wheels;Shower seat;Grab bars - tub/shower          Prior Functioning/Environment Level of Independence: Needs assistance  Gait / Transfers Assistance Needed: Pt using RW for ambulation. Reports falls at home.  ADL's / Homemaking Assistance Needed: son does cooking and cleaning and says pt does all bathing/dressing herself   Comments: Pt has life alert necklace.    OT Diagnosis: Generalized weakness   OT Problem List: Decreased strength;Decreased activity tolerance;Decreased knowledge of use of DME or AE;Decreased knowledge of precautions;Pain;Impaired sensation   OT Treatment/Interventions: Self-care/ADL training;DME and/or AE instruction;Therapeutic activities;Patient/family education;Balance training;Therapeutic exercise    OT Goals(Current goals can be found in the care plan section) Acute Rehab OT Goals Patient Stated Goal: not stated OT Goal Formulation: With patient Time For Goal Achievement: 12/07/14 Potential to Achieve Goals: Good ADL Goals Pt Will Perform Lower Body Dressing: with modified independence;sit to/from stand Pt Will Transfer to Toilet: with modified independence;ambulating (3 in 1 over commode) Pt Will Perform Toileting - Clothing Manipulation and hygiene: with modified independence;sit to/from stand  OT Frequency: Min 2X/week   Barriers to D/C:            Co-evaluation              End of Session Equipment Utilized During Treatment: Gait belt;Rolling walker (O2 placed back on towards end of session) Nurse Communication: Other (comment) (recommending 3 in 1 and HHOT)  Activity Tolerance: Patient tolerated treatment well Patient left: in bed;with call bell/phone within reach   Time: 5638-9373 OT Time Calculation (min): 31 min Charges:  OT General Charges $OT Visit: 1 Procedure OT  Evaluation $Initial OT Evaluation Tier I: 1 Procedure OT Treatments $Self Care/Home Management : 8-22 mins G-CodesBenito Mccreedy OTR/L [phone] 11/30/2014, 4:24 PM

## 2014-12-01 DIAGNOSIS — I517 Cardiomegaly: Secondary | ICD-10-CM

## 2014-12-01 DIAGNOSIS — I5032 Chronic diastolic (congestive) heart failure: Secondary | ICD-10-CM | POA: Diagnosis present

## 2014-12-01 NOTE — Progress Notes (Signed)
Physical Therapy Treatment Patient Details Name: Vicki Bauer MRN: [id number] DOB: 1928-03-05 Today's Date: 12/01/2014    History of Present Illness Patient is a 80 y/o female with PMH of HTN, CAD s/p stents, hx of lymphoma s/p lymphatic resection presenting with suspected CVA and UTI. Son returned home from work and noticed pt w/ some word finding difficulties. Head CT-unremarkable. UA indicative of infection.     PT Comments    Patient is progressing well and able to walk a good distance today. Patient is eager to DC home. Standing hip flexion performed to ensure foot clearance with steps. Patient safe to D/C from a mobility standpoint based on progression towards goals set on PT eval.    Follow Up Recommendations  Home health PT;Supervision/Assistance - 24 hour     Equipment Recommendations  None recommended by PT    Recommendations for Other Services       Precautions / Restrictions Precautions Precautions: Fall Restrictions Weight Bearing Restrictions: No    Mobility  Bed Mobility Overal bed mobility: Modified Independent                Transfers Overall transfer level: Needs assistance   Transfers: Sit to/from Stand Sit to Stand: Min guard         General transfer comment: cues for hand placement.  Ambulation/Gait Ambulation/Gait assistance: Min guard Ambulation Distance (Feet): 300 Feet Assistive device: Rolling walker (2 wheeled) Gait Pattern/deviations: Step-through pattern;Decreased stride length     General Gait Details: Pt with slow, guarded gait. Cues for upright posture and management of RW   Stairs            Wheelchair Mobility    Modified Rankin (Stroke Patients Only)       Balance                                    Cognition Arousal/Alertness: Awake/alert Behavior During Therapy: WFL for tasks assessed/performed Overall Cognitive Status: Impaired/Different from baseline                  General Comments: Pt has difficulty with word finding     Exercises      General Comments        Pertinent Vitals/Pain Pain Assessment: No/denies pain    Home Living                      Prior Function            PT Goals (current goals can now be found in the care plan section) Progress towards PT goals: Progressing toward goals    Frequency  Min 3X/week    PT Plan Current plan remains appropriate    Co-evaluation             End of Session Equipment Utilized During Treatment: Gait belt Activity Tolerance: Patient tolerated treatment well Patient left: in chair;with call bell/phone within reach;with chair alarm set     Time: 8101-7510 PT Time Calculation (min) (ACUTE ONLY): 24 min  Charges:  $Gait Training: 8-22 mins $Therapeutic Activity: 8-22 mins                    G Codes:      Jacqualyn Posey 12/01/2014, 12:12 PM 12/01/2014 Jacqualyn Posey PTA [phone] pager [phone] office

## 2014-12-01 NOTE — Progress Notes (Signed)
Talked to patient's son Azucena Kuba ( [phone]) about [place] choices, Marcello Moores requested [place] for [facility] services; Miranda with [place] called for arrangements; Also ordered 3:1 to be delivered to the patient's room today prior to discharge home; Aneta Mins [phone]

## 2014-12-01 NOTE — Progress Notes (Signed)
  Echocardiogram 2D Echocardiogram has been performed.  Vicki Bauer 12/01/2014, 8:44 AM

## 2014-12-01 NOTE — Discharge Summary (Signed)
Discharge Summary  SOPHEAP BASIC AYT:[id number] DOB: May 12, 1928  PCP: Lilian Coma, MD  Admit date: 11/29/2014 Discharge date: 12/01/2014  Time spent: 25 minutes  Recommendations for Outpatient Follow-up:  1. Cipro 500 mg by mouth twice a day 2. With results of echo, patient would be served better with a low-dose lisinopril. Upon follow-up with primary care physician, this medication can be initiated  Discharge Diagnoses:  Active Hospital Problems   Diagnosis Date Noted  . Expressive aphasia 11/29/2014  .  hypothyroidism Hypertension CAD  11/29/2014  . UTI (lower urinary tract infection) 11/29/2014    Resolved Hospital Problems   Diagnosis Date Noted Date Resolved  No resolved problems to display.    Discharge Condition: Improved, being discharged home with home health PT and OT  Diet recommendation: Heart healthy  Filed Weights   11/30/14 0016  Weight: 74.617 kg (164 lb 8 oz)    History of present illness:  Patient is an 80 -year-old female admitted on 12/22 with history of hypertension and hypothyroidism who was seen by her son today and noted to have a aphasia. Patient brought into instrument CT scan unrevealing. Patient noted also to have UTI.  Hospital Course:  Active Problems:   Expressive aphasia: Workup unrevealing. MRI negative. Seen by neurology and felt that her symptoms were secondary to a urinary tract infection were which resolved with treatment of infection plus hydration. Initial plan was to discharge patient home after echocardiogram done 12/23, but because of delay, echo not done until 12/24 Hypothyroidism: Stable, he shouldn't continue on Synthroid   UTI (lower urinary tract infection): As above Hypertension: Patient continued on home meds Chronic diastolic heart failure: Grade 1 diastolic dysfunction incidentally noted on echo. Patient already on beta blocker. Euvolemic. Echo results noted following discharge. Have to be indicated to primary  care physician that ACE inhibitor should be started with follow-up visit with PCP  Procedures:  Carotid Dopplers: No evidence of carotid artery stenosis bilaterally  2-D echo done 12/24:  Consultations:  Neurology  Discharge Exam: BP 143/52 mmHg  Pulse 74  Temp(Src) 97.8 F (36.6 C) (Oral)  Resp 20  Ht 5\' 4"  (1.626 m)  Wt 74.617 kg (164 lb 8 oz)  BMI 28.22 kg/m2  SpO2 92%  General: Alert and oriented 3, no acute distress Cardiovascular: Regular rate and rhythm, S1-S2 Respiratory: Clear to auscultation bilaterally  Discharge Instructions You were cared for by a hospitalist during your hospital stay. If you have any questions about your discharge medications or the care you received while you were in the hospital after you are discharged, you can call the unit and asked to speak with the hospitalist on call if the hospitalist that took care of you is not available. Once you are discharged, your primary care physician will handle any further medical issues. Please note that NO REFILLS for any discharge medications will be authorized once you are discharged, as it is imperative that you return to your primary care physician (or establish a relationship with a primary care physician if you do not have one) for your aftercare needs so that they can reassess your need for medications and monitor your lab values.  Discharge Instructions    Diet - low sodium heart healthy    Complete by:  As directed      Increase activity slowly    Complete by:  As directed             Medication List    TAKE these medications  bisoprolol 5 MG tablet  Commonly known as:  ZEBETA  Take 5 mg by mouth daily.     buPROPion 200 MG 12 hr tablet  Commonly known as:  WELLBUTRIN SR  Take 200 mg by mouth 2 (two) times daily.     CENTRUM SILVER ADULT 50+ Tabs  Take 1 tablet by mouth daily.     ciprofloxacin 500 MG tablet  Commonly known as:  CIPRO  Take 1 tablet (500 mg total) by mouth 2  (two) times daily.     DULoxetine 60 MG capsule  Commonly known as:  CYMBALTA  Take 60 mg by mouth 2 (two) times daily.     furosemide 40 MG tablet  Commonly known as:  LASIX  Take 40 mg by mouth daily.     GAS-X ULTRA STRENGTH 180 MG Caps  Generic drug:  Simethicone  Take 180 mg by mouth at bedtime.     GRALISE 600 MG Tabs  Generic drug:  Gabapentin (Once-Daily)  Take 1 tablet by mouth at bedtime.     levothyroxine 25 MCG tablet  Commonly known as:  SYNTHROID, LEVOTHROID  Take 25 mcg by mouth daily before breakfast.     LORazepam 1 MG tablet  Commonly known as:  ATIVAN  Take 1 mg by mouth at bedtime.     pregabalin 50 MG capsule  Commonly known as:  LYRICA  Take 50 mg by mouth 3 (three) times daily.     pregabalin 150 MG capsule  Commonly known as:  LYRICA  Take 150 mg by mouth 2 (two) times daily.     Zinc 50 MG Caps  Take 100 mg by mouth daily.     zolpidem 5 MG tablet  Commonly known as:  AMBIEN  Take 5 mg by mouth at bedtime.       Allergies  Allergen Reactions  . Demerol [Meperidine] Other (See Comments)    Turned patient blue and couldn't breathe  . Morphine And Related Other (See Comments)    Turned patient blue and couldn't breathe   . Macrolides And Ketolides Nausea Only      The results of significant diagnostics from this hospitalization (including imaging, microbiology, ancillary and laboratory) are listed below for reference.    Significant Diagnostic Studies: Dg Chest 2 View  11/30/2014   CLINICAL DATA:  Weakness and confusion, stroke  EXAM: CHEST  2 VIEW  COMPARISON:  05/28/2013  FINDINGS: The heart size and mediastinal contours are within normal limits. Patchy retrocardiac opacity is identified. No pleural effusion. The visualized skeletal structures are unremarkable.  IMPRESSION: Patchy retrocardiac opacity which could indicate early pneumonia although atelectasis could look similar.   Electronically Signed   By: Conchita Paris M.D.   On:  11/30/2014 01:14   Ct Head Wo Contrast  11/29/2014   CLINICAL DATA:  80 year old female with altered mental status  EXAM: CT HEAD WITHOUT CONTRAST  TECHNIQUE: Contiguous axial images were obtained from the base of the skull through the vertex without intravenous contrast.  COMPARISON:  Prior head CT 09/20/2014  FINDINGS: Negative for acute intracranial hemorrhage, acute infarction, mass, mass effect, hydrocephalus or midline shift. Gray-white differentiation is preserved throughout. Similar pattern of cerebral and cerebellar atrophy and advanced chronic microvascular ischemic white matter disease. No focal soft tissue or calvarial abnormality. Globes and orbits are intact and unremarkable bilaterally. Normal aeration of the mastoid air cells and paranasal sinuses. Intracranial atherosclerosis of the bilateral cavernous carotid arteries. Hyperostosis frontalis interna.  IMPRESSION: 1. No  acute intracranial abnormality. 2. Similar appearance of atrophy and advanced chronic microvascular ischemic white matter disease.   Electronically Signed   By: Jacqulynn Cadet M.D.   On: 11/29/2014 21:01   Mr Brain Wo Contrast  11/30/2014   CLINICAL DATA:  Word finding difficulty with right arm pain. New onset today. Initial encounter.  EXAM: MRI HEAD WITHOUT CONTRAST  MRA HEAD WITHOUT CONTRAST  TECHNIQUE: Multiplanar, multiecho pulse sequences of the brain and surrounding structures were obtained without intravenous contrast. Angiographic images of the head were obtained using MRA technique without contrast.  COMPARISON:  Prior CT from 11/29/2014.  FINDINGS: MRI HEAD FINDINGS  Diffuse prominence of the CSF containing spaces is compatible with generalized cerebral atrophy. Patchy and confluent T2/FLAIR hyperintensity within the periventricular and deep white matter both cerebral hemispheres is most consistent with chronic small vessel ischemic disease, moderate for patient age.  No abnormal foci of restricted diffusion to  suggest acute intracranial infarct. Gray-white matter differentiation maintained. No intracranial hemorrhage.  No mass lesion or midline shift. No mass effect. Ventricles are normal in size without evidence of hydrocephalus. No extra-axial fluid collection.  Craniocervical junction within normal limits. Mild degenerative changes noted within the visualized upper cervical spine. Pituitary gland within normal limits. No acute abnormality seen about the orbits.  Bone marrow signal intensity is within normal limits. No acute abnormality seen within the scalp.  Paranasal sinuses and mastoid air cells are clear.  MRA HEAD FINDINGS  ANTERIOR CIRCULATION:  The visualized distal cervical segments of the internal carotid arteries are widely patent with antegrade flow. The petrous, cavernous, and supra clinoid segments widely patent without hemodynamically significant stenosis. A1 segments, anterior communicating artery, and anterior cerebral arteries widely patent.  M1 segments well opacified bilaterally without hemodynamically significant stenosis or proximal branch occlusion. MCA bifurcations within normal limits. Mild distal branch atherosclerotic irregularity present within the MCA branches bilaterally.  POSTERIOR CIRCULATION:  The left vertebral artery is dominant. The right vertebral artery appears to terminate in the right posterior inferior cerebellar artery. The basilar artery is somewhat diminutive with moderate multi focal atherosclerotic irregularity. No focal high-grade stenosis. Superior cerebellar arteries patent bilaterally. P1 segments are markedly hypoplastic bilaterally with prominent posterior communicating arteries. No proximal branch occlusion seen within knee P2 segments. There is mild distal branch irregularity within the posterior cerebral arteries bilaterally.  No aneurysm or vascular malformation.  IMPRESSION: MRI HEAD IMPRESSION:  1. No acute intracranial infarct or other abnormality identified. 2.  Generalized cerebral atrophy with moderate chronic microvascular ischemic disease.  MRA HEAD IMPRESSION:  1. No proximal branch occlusion or focal hemodynamically significant stenosis identified within the intracranial circulation. 2. Somewhat diminutive basilar artery with moderate multi focal atherosclerotic irregularity. There are prominent posterior communicating arteries bilaterally. 3. Moderate atherosclerotic irregularity within the distal MCA and PCA branches bilaterally.   Electronically Signed   By: Jeannine Boga M.D.   On: 11/30/2014 06:09   Mr Jodene Nam Head/brain Wo Cm  11/30/2014   CLINICAL DATA:  Word finding difficulty with right arm pain. New onset today. Initial encounter.  EXAM: MRI HEAD WITHOUT CONTRAST  MRA HEAD WITHOUT CONTRAST  TECHNIQUE: Multiplanar, multiecho pulse sequences of the brain and surrounding structures were obtained without intravenous contrast. Angiographic images of the head were obtained using MRA technique without contrast.  COMPARISON:  Prior CT from 11/29/2014.  FINDINGS: MRI HEAD FINDINGS  Diffuse prominence of the CSF containing spaces is compatible with generalized cerebral atrophy. Patchy and confluent T2/FLAIR hyperintensity within the periventricular  and deep white matter both cerebral hemispheres is most consistent with chronic small vessel ischemic disease, moderate for patient age.  No abnormal foci of restricted diffusion to suggest acute intracranial infarct. Gray-white matter differentiation maintained. No intracranial hemorrhage.  No mass lesion or midline shift. No mass effect. Ventricles are normal in size without evidence of hydrocephalus. No extra-axial fluid collection.  Craniocervical junction within normal limits. Mild degenerative changes noted within the visualized upper cervical spine. Pituitary gland within normal limits. No acute abnormality seen about the orbits.  Bone marrow signal intensity is within normal limits. No acute abnormality seen  within the scalp.  Paranasal sinuses and mastoid air cells are clear.  MRA HEAD FINDINGS  ANTERIOR CIRCULATION:  The visualized distal cervical segments of the internal carotid arteries are widely patent with antegrade flow. The petrous, cavernous, and supra clinoid segments widely patent without hemodynamically significant stenosis. A1 segments, anterior communicating artery, and anterior cerebral arteries widely patent.  M1 segments well opacified bilaterally without hemodynamically significant stenosis or proximal branch occlusion. MCA bifurcations within normal limits. Mild distal branch atherosclerotic irregularity present within the MCA branches bilaterally.  POSTERIOR CIRCULATION:  The left vertebral artery is dominant. The right vertebral artery appears to terminate in the right posterior inferior cerebellar artery. The basilar artery is somewhat diminutive with moderate multi focal atherosclerotic irregularity. No focal high-grade stenosis. Superior cerebellar arteries patent bilaterally. P1 segments are markedly hypoplastic bilaterally with prominent posterior communicating arteries. No proximal branch occlusion seen within knee P2 segments. There is mild distal branch irregularity within the posterior cerebral arteries bilaterally.  No aneurysm or vascular malformation.  IMPRESSION: MRI HEAD IMPRESSION:  1. No acute intracranial infarct or other abnormality identified. 2. Generalized cerebral atrophy with moderate chronic microvascular ischemic disease.  MRA HEAD IMPRESSION:  1. No proximal branch occlusion or focal hemodynamically significant stenosis identified within the intracranial circulation. 2. Somewhat diminutive basilar artery with moderate multi focal atherosclerotic irregularity. There are prominent posterior communicating arteries bilaterally. 3. Moderate atherosclerotic irregularity within the distal MCA and PCA branches bilaterally.   Electronically Signed   By: Jeannine Boga M.D.    On: 11/30/2014 06:09    Microbiology: Recent Results (from the past 240 hour(s))  Urine culture     Status: None (Preliminary result)   Collection Time: 11/29/14  8:34 PM  Result Value Ref Range Status   Specimen Description URINE, CLEAN CATCH  Final   Special Requests ADDED [id number]  Final   Culture  Setup Time   Final    11/30/2014 04:25 Performed at [place]   Final    >=100,000 COLONIES/ML Performed at [facility]    Culture   Final    ESCHERICHIA COLI Performed at [facility]    Report Status PENDING  Incomplete     Labs: Basic Metabolic Panel:  Recent Labs Lab 11/29/14 2010 11/29/14 2330  NA 140  --   K 2.3*  --   CL 99  --   CO2 31  --   GLUCOSE 91  --   BUN 11  --   CREATININE 0.75 0.79  CALCIUM 8.8  --    Liver Function Tests:  Recent Labs Lab 11/29/14 2010  AST 28  ALT 18  ALKPHOS 93  BILITOT 1.0  PROT 5.3*  ALBUMIN 3.4*   No results for input(s): LIPASE, AMYLASE in the last 168 hours. No results for input(s): AMMONIA in the last 168 hours. CBC:  Recent Labs Lab 11/29/14 2010 11/29/14 2330  WBC 5.0 5.3  NEUTROABS 2.9  --   HGB 12.5 11.8*  HCT 39.4 37.3  MCV 84.9 84.6  PLT 100* 106*   Cardiac Enzymes: No results for input(s): CKTOTAL, CKMB, CKMBINDEX, TROPONINI in the last 168 hours. BNP: BNP (last 3 results) No results for input(s): PROBNP in the last 8760 hours. CBG: No results for input(s): GLUCAP in the last 168 hours.     Signed:  Annita Brod  [facility] 12/01/2014, 3:41 PM

## 2014-12-01 NOTE — Progress Notes (Signed)
Following completion of 2D echo this am. MD notified me that patient could be discharged. Met with son and patient to review discharge documents. Advised that 1 Rx called into [facility]. No follow up appts. Stroke prevention sheet reviewed. 3 in 1 delivered to room prior to discharge. Pt was transported to lobby via wheelchair at 1201, with son providing transportation to home.  

## 2014-12-02 LAB — URINE CULTURE: Colony Count: >=100000

## 2015-02-09 ENCOUNTER — Encounter (HOSPITAL_COMMUNITY): Payer: Self-pay

## 2015-02-09 ENCOUNTER — Inpatient Hospital Stay (HOSPITAL_COMMUNITY): Payer: MEDICARE

## 2015-02-09 ENCOUNTER — Inpatient Hospital Stay (HOSPITAL_COMMUNITY)
Admission: EM | Admit: 2015-02-09 | Discharge: 2015-02-17 | DRG: 871 | Disposition: A | Discharge status: Skilled Nursing Facility | Payer: MEDICARE | Attending: Internal Medicine | Admitting: Internal Medicine

## 2015-02-09 ENCOUNTER — Emergency Department (HOSPITAL_COMMUNITY)
Admission: EM | Admit: 2015-02-09 | Discharge: 2015-02-09 | Discharge status: Absent without leave | Payer: Self-pay | Attending: Emergency Medicine | Admitting: Emergency Medicine

## 2015-02-09 ENCOUNTER — Emergency Department (HOSPITAL_COMMUNITY): Payer: MEDICARE

## 2015-02-09 DIAGNOSIS — Z79899 Other long term (current) drug therapy: Secondary | ICD-10-CM

## 2015-02-09 DIAGNOSIS — Z9221 Personal history of antineoplastic chemotherapy: Secondary | ICD-10-CM

## 2015-02-09 DIAGNOSIS — E870 Hyperosmolality and hypernatremia: Secondary | ICD-10-CM | POA: Diagnosis not present

## 2015-02-09 DIAGNOSIS — L03115 Cellulitis of right lower limb: Secondary | ICD-10-CM | POA: Diagnosis present

## 2015-02-09 DIAGNOSIS — I5032 Chronic diastolic (congestive) heart failure: Secondary | ICD-10-CM | POA: Diagnosis present

## 2015-02-09 DIAGNOSIS — I2510 Atherosclerotic heart disease of native coronary artery without angina pectoris: Secondary | ICD-10-CM | POA: Diagnosis present

## 2015-02-09 DIAGNOSIS — G4700 Insomnia, unspecified: Secondary | ICD-10-CM | POA: Diagnosis present

## 2015-02-09 DIAGNOSIS — L03119 Cellulitis of unspecified part of limb: Secondary | ICD-10-CM | POA: Diagnosis present

## 2015-02-09 DIAGNOSIS — D469 Myelodysplastic syndrome, unspecified: Secondary | ICD-10-CM | POA: Diagnosis present

## 2015-02-09 DIAGNOSIS — R319 Hematuria, unspecified: Secondary | ICD-10-CM | POA: Diagnosis present

## 2015-02-09 DIAGNOSIS — G9340 Encephalopathy, unspecified: Secondary | ICD-10-CM | POA: Diagnosis present

## 2015-02-09 DIAGNOSIS — R0902 Hypoxemia: Secondary | ICD-10-CM | POA: Diagnosis not present

## 2015-02-09 DIAGNOSIS — J209 Acute bronchitis, unspecified: Secondary | ICD-10-CM | POA: Diagnosis present

## 2015-02-09 DIAGNOSIS — Z881 Allergy status to other antibiotic agents status: Secondary | ICD-10-CM | POA: Diagnosis not present

## 2015-02-09 DIAGNOSIS — R05 Cough: Secondary | ICD-10-CM

## 2015-02-09 DIAGNOSIS — M25559 Pain in unspecified hip: Secondary | ICD-10-CM

## 2015-02-09 DIAGNOSIS — H3530 Unspecified macular degeneration: Secondary | ICD-10-CM | POA: Diagnosis present

## 2015-02-09 DIAGNOSIS — D61818 Other pancytopenia: Secondary | ICD-10-CM

## 2015-02-09 DIAGNOSIS — A419 Sepsis, unspecified organism: Principal | ICD-10-CM | POA: Diagnosis present

## 2015-02-09 DIAGNOSIS — R21 Rash and other nonspecific skin eruption: Secondary | ICD-10-CM | POA: Diagnosis present

## 2015-02-09 DIAGNOSIS — R4702 Dysphasia: Secondary | ICD-10-CM | POA: Diagnosis not present

## 2015-02-09 DIAGNOSIS — R059 Cough, unspecified: Secondary | ICD-10-CM

## 2015-02-09 DIAGNOSIS — Z955 Presence of coronary angioplasty implant and graft: Secondary | ICD-10-CM | POA: Diagnosis not present

## 2015-02-09 DIAGNOSIS — R296 Repeated falls: Secondary | ICD-10-CM | POA: Diagnosis present

## 2015-02-09 DIAGNOSIS — G9341 Metabolic encephalopathy: Secondary | ICD-10-CM | POA: Diagnosis present

## 2015-02-09 DIAGNOSIS — Z87891 Personal history of nicotine dependence: Secondary | ICD-10-CM | POA: Diagnosis not present

## 2015-02-09 DIAGNOSIS — W1830XA Fall on same level, unspecified, initial encounter: Secondary | ICD-10-CM | POA: Diagnosis present

## 2015-02-09 DIAGNOSIS — N179 Acute kidney failure, unspecified: Secondary | ICD-10-CM | POA: Diagnosis present

## 2015-02-09 DIAGNOSIS — E039 Hypothyroidism, unspecified: Secondary | ICD-10-CM | POA: Diagnosis present

## 2015-02-09 DIAGNOSIS — N3001 Acute cystitis with hematuria: Secondary | ICD-10-CM

## 2015-02-09 DIAGNOSIS — R531 Weakness: Secondary | ICD-10-CM | POA: Diagnosis not present

## 2015-02-09 DIAGNOSIS — N390 Urinary tract infection, site not specified: Secondary | ICD-10-CM | POA: Diagnosis present

## 2015-02-09 DIAGNOSIS — R161 Splenomegaly, not elsewhere classified: Secondary | ICD-10-CM | POA: Diagnosis present

## 2015-02-09 DIAGNOSIS — N3281 Overactive bladder: Secondary | ICD-10-CM | POA: Diagnosis present

## 2015-02-09 DIAGNOSIS — G4733 Obstructive sleep apnea (adult) (pediatric): Secondary | ICD-10-CM | POA: Diagnosis present

## 2015-02-09 DIAGNOSIS — R233 Spontaneous ecchymoses: Secondary | ICD-10-CM | POA: Diagnosis present

## 2015-02-09 DIAGNOSIS — Z96653 Presence of artificial knee joint, bilateral: Secondary | ICD-10-CM | POA: Diagnosis present

## 2015-02-09 DIAGNOSIS — I959 Hypotension, unspecified: Secondary | ICD-10-CM | POA: Diagnosis present

## 2015-02-09 DIAGNOSIS — Z885 Allergy status to narcotic agent status: Secondary | ICD-10-CM | POA: Diagnosis not present

## 2015-02-09 DIAGNOSIS — M25551 Pain in right hip: Secondary | ICD-10-CM | POA: Diagnosis present

## 2015-02-09 DIAGNOSIS — I10 Essential (primary) hypertension: Secondary | ICD-10-CM | POA: Diagnosis present

## 2015-02-09 DIAGNOSIS — B952 Enterococcus as the cause of diseases classified elsewhere: Secondary | ICD-10-CM | POA: Diagnosis present

## 2015-02-09 DIAGNOSIS — C8590 Non-Hodgkin lymphoma, unspecified, unspecified site: Secondary | ICD-10-CM | POA: Diagnosis not present

## 2015-02-09 DIAGNOSIS — Z8572 Personal history of non-Hodgkin lymphomas: Secondary | ICD-10-CM | POA: Diagnosis not present

## 2015-02-09 DIAGNOSIS — Y92003 Bedroom of unspecified non-institutional (private) residence as the place of occurrence of the external cause: Secondary | ICD-10-CM | POA: Diagnosis not present

## 2015-02-09 DIAGNOSIS — A267 Erysipelothrix sepsis: Secondary | ICD-10-CM | POA: Diagnosis present

## 2015-02-09 DIAGNOSIS — D696 Thrombocytopenia, unspecified: Secondary | ICD-10-CM | POA: Diagnosis present

## 2015-02-09 LAB — COMPREHENSIVE METABOLIC PANEL
ALT: 16 U/L (ref 0–35)
AST: 26 U/L (ref 0–37)
Albumin: 2.8 g/dL — ABNORMAL LOW (ref 3.5–5.2)
Alkaline Phosphatase: 107 U/L (ref 39–117)
Anion gap: 7 (ref 5–15)
BUN: 24 mg/dL — ABNORMAL HIGH (ref 6–23)
CALCIUM: 7.9 mg/dL — AB (ref 8.4–10.5)
CO2: 24 mmol/L (ref 19–32)
Chloride: 108 mmol/L (ref 96–112)
Creatinine, Ser: 1.05 mg/dL (ref 0.50–1.10)
GFR calc non Af Amer: 47 mL/min — ABNORMAL LOW (ref >90)
GFR, EST AFRICAN AMERICAN: 55 mL/min — AB (ref >90)
Glucose, Bld: 95 mg/dL (ref 70–99)
Potassium: 4.2 mmol/L (ref 3.5–5.1)
Sodium: 139 mmol/L (ref 135–145)
Total Bilirubin: 0.8 mg/dL (ref 0.3–1.2)
Total Protein: 4.7 g/dL — ABNORMAL LOW (ref 6.0–8.3)

## 2015-02-09 LAB — URINALYSIS, ROUTINE W REFLEX MICROSCOPIC
BILIRUBIN URINE: NEGATIVE
GLUCOSE, UA: NEGATIVE mg/dL
Ketones, ur: NEGATIVE mg/dL
Nitrite: NEGATIVE
PH: 5.5 (ref 5.0–8.0)
Protein, ur: NEGATIVE mg/dL
Specific Gravity, Urine: 1.02 (ref 1.005–1.030)
Urobilinogen, UA: 1 mg/dL (ref 0.0–1.0)

## 2015-02-09 LAB — CBC WITH DIFFERENTIAL/PLATELET
BASOS PCT: 1 % (ref 0–1)
Basophils Absolute: 0 10*3/uL (ref 0.0–0.1)
EOS ABS: 0.1 10*3/uL (ref 0.0–0.7)
Eosinophils Relative: 4 % (ref 0–5)
HCT: 37.8 % (ref 36.0–46.0)
Hemoglobin: 11.9 g/dL — ABNORMAL LOW (ref 12.0–15.0)
LYMPHS ABS: 0.9 10*3/uL (ref 0.7–4.0)
LYMPHS PCT: 39 % (ref 12–46)
MCH: 28.2 pg (ref 26.0–34.0)
MCHC: 31.5 g/dL (ref 30.0–36.0)
MCV: 89.6 fL (ref 78.0–100.0)
Monocytes Absolute: 0.1 10*3/uL (ref 0.1–1.0)
Monocytes Relative: 2 % — ABNORMAL LOW (ref 3–12)
NEUTROS PCT: 54 % (ref 43–77)
Neutro Abs: 1.2 10*3/uL — ABNORMAL LOW (ref 1.7–7.7)
Platelets: 43 10*3/uL — ABNORMAL LOW (ref 150–400)
RBC: 4.22 MIL/uL (ref 3.87–5.11)
RDW: 15.9 % — AB (ref 11.5–15.5)
WBC: 2.3 10*3/uL — AB (ref 4.0–10.5)

## 2015-02-09 LAB — APTT: aPTT: 26 seconds (ref 24–37)

## 2015-02-09 LAB — TSH: TSH: 2.445 u[IU]/mL (ref 0.350–4.500)

## 2015-02-09 LAB — LACTIC ACID, PLASMA
Lactic Acid, Venous: 1.9 mmol/L (ref 0.5–2.0)
Lactic Acid, Venous: 1.2 mmol/L (ref 0.5–2.0)

## 2015-02-09 LAB — URINE MICROSCOPIC-ADD ON

## 2015-02-09 LAB — LIPASE, BLOOD: LIPASE: 21 U/L (ref 11–59)

## 2015-02-09 LAB — PROTIME-INR
INR: 1.01 (ref 0.00–1.49)
PROTHROMBIN TIME: 13.4 s (ref 11.6–15.2)

## 2015-02-09 LAB — I-STAT CG4 LACTIC ACID, ED: LACTIC ACID, VENOUS: 0.89 mmol/L (ref 0.5–2.0)

## 2015-02-09 LAB — PROCALCITONIN: PROCALCITONIN: 0.37 ng/mL

## 2015-02-09 LAB — TECHNOLOGIST SMEAR REVIEW

## 2015-02-09 LAB — FIBRINOGEN: Fibrinogen: 376 mg/dL (ref 204–475)

## 2015-02-09 MED ORDER — PREGABALIN 75 MG PO CAPS
150.0000 mg | ORAL_CAPSULE | Freq: Two times a day (BID) | ORAL | Status: DC
  Administered 2015-02-09 – 2015-02-16 (×14): 150 mg via ORAL
  Filled 2015-02-09 (×14): qty 2

## 2015-02-09 MED ORDER — ACETAMINOPHEN 325 MG PO TABS
650.0000 mg | ORAL_TABLET | Freq: Four times a day (QID) | ORAL | Status: DC | PRN
  Administered 2015-02-09 – 2015-02-16 (×9): 650 mg via ORAL
  Filled 2015-02-09 (×10): qty 2

## 2015-02-09 MED ORDER — ADULT MULTIVITAMIN W/MINERALS CH
1.0000 | ORAL_TABLET | Freq: Every day | ORAL | Status: DC
  Administered 2015-02-10 – 2015-02-17 (×8): 1 via ORAL
  Filled 2015-02-09 (×9): qty 1

## 2015-02-09 MED ORDER — SODIUM CHLORIDE 0.9 % IJ SOLN
3.0000 mL | Freq: Two times a day (BID) | INTRAMUSCULAR | Status: DC
  Administered 2015-02-09 – 2015-02-15 (×10): 3 mL via INTRAVENOUS

## 2015-02-09 MED ORDER — LORAZEPAM 1 MG PO TABS
1.0000 mg | ORAL_TABLET | Freq: Every day | ORAL | Status: DC
  Administered 2015-02-09 – 2015-02-16 (×8): 1 mg via ORAL
  Filled 2015-02-09 (×8): qty 1

## 2015-02-09 MED ORDER — LEVOTHYROXINE SODIUM 25 MCG PO TABS
25.0000 ug | ORAL_TABLET | Freq: Every day | ORAL | Status: DC
  Administered 2015-02-10 – 2015-02-17 (×8): 25 ug via ORAL
  Filled 2015-02-09 (×10): qty 1

## 2015-02-09 MED ORDER — SODIUM CHLORIDE 0.9 % IV SOLN
INTRAVENOUS | Status: DC
  Administered 2015-02-09 (×2): via INTRAVENOUS

## 2015-02-09 MED ORDER — BUPROPION HCL ER (SR) 100 MG PO TB12
200.0000 mg | ORAL_TABLET | Freq: Two times a day (BID) | ORAL | Status: DC
  Administered 2015-02-09 – 2015-02-17 (×16): 200 mg via ORAL
  Filled 2015-02-09 (×18): qty 2

## 2015-02-09 MED ORDER — ACETAMINOPHEN 650 MG RE SUPP: 650.0000 mg | Freq: Four times a day (QID) | RECTAL | Status: DC | PRN

## 2015-02-09 MED ORDER — SODIUM CHLORIDE 0.9 % IV SOLN
INTRAVENOUS | Status: DC
  Administered 2015-02-09: 12:00:00 via INTRAVENOUS

## 2015-02-09 MED ORDER — SODIUM CHLORIDE 0.9 % IV BOLUS (SEPSIS)
1000.0000 mL | Freq: Once | INTRAVENOUS | Status: AC
  Administered 2015-02-09: 1000 mL via INTRAVENOUS

## 2015-02-09 MED ORDER — VANCOMYCIN HCL IN DEXTROSE 750-5 MG/150ML-% IV SOLN
750.0000 mg | INTRAVENOUS | Status: DC
  Administered 2015-02-09: 750 mg via INTRAVENOUS
  Filled 2015-02-09: qty 150

## 2015-02-09 MED ORDER — DEXTROSE 5 % IV SOLN
1.0000 g | Freq: Once | INTRAVENOUS | Status: AC
  Administered 2015-02-09: 1 g via INTRAVENOUS
  Filled 2015-02-09: qty 10

## 2015-02-09 MED ORDER — ONDANSETRON HCL 4 MG/2ML IJ SOLN: 4.0000 mg | Freq: Four times a day (QID) | INTRAMUSCULAR | Status: DC | PRN

## 2015-02-09 MED ORDER — ONDANSETRON HCL 4 MG PO TABS: 4.0000 mg | ORAL_TABLET | Freq: Four times a day (QID) | ORAL | Status: DC | PRN

## 2015-02-09 MED ORDER — PIPERACILLIN-TAZOBACTAM 3.375 G IVPB
3.3750 g | Freq: Three times a day (TID) | INTRAVENOUS | Status: DC
  Administered 2015-02-09 – 2015-02-10 (×3): 3.375 g via INTRAVENOUS
  Filled 2015-02-09 (×4): qty 50

## 2015-02-09 NOTE — ED Provider Notes (Addendum)
TIME SEEN: 9:35 AM  CHIEF COMPLAINT: Multiple falls  HPI: Pt is a 81 y.o. F with history of hypertension, coronary artery disease, lymphoma who presents emergency room with frequent falls today. Reports that she feels that she may have tripped but she landed on her butt. Did not hit her head. Is not complaining of any new pain. She lives at home with her son who is concerned that she may have a urinary tract infection because he states that this is happened to her before. In the emergency room patient is hypotensive and hypoxic. Reports she has sleep apnea and wears see Pap at night but does not wear oxygen otherwise. Denies shortness of breath but does have chest pain but she points to her right upper quadrant for this. She is status post cholecystectomy. Reports eating and drinking well. Denies vomiting or diarrhea. Has chronic cough is unchanged. She may have had a fever last night but did not measure it.  PCP is Alessandra Grout with [facility]  ROS: See HPI Constitutional: subjective fever  Eyes: no drainage  ENT: no runny nose   Cardiovascular:  no chest pain  Resp: no SOB  GI: no vomiting GU: no dysuria Integumentary: no rash  Allergy: no hives  Musculoskeletal: no leg swelling  Neurological: no slurred speech ROS otherwise negative  PAST MEDICAL HISTORY/PAST SURGICAL HISTORY:  Past Medical History  Diagnosis Date  . Hypertension   . Hypothyroidism   . Macular degeneration   . Cancer     lymphoma  . Insomnia   . OAB (overactive bladder)   . CAD (coronary artery disease) 1990    s/p stent x2,    MEDICATIONS:  Prior to Admission medications   Medication Sig Start Date End Date Taking? Authorizing Provider  bisoprolol (ZEBETA) 5 MG tablet Take 5 mg by mouth daily.    Historical Provider, MD  buPROPion (WELLBUTRIN SR) 200 MG 12 hr tablet Take 200 mg by mouth 2 (two) times daily.    Historical Provider, MD  ciprofloxacin (CIPRO) 500 MG tablet Take 1 tablet (500 mg total) by mouth  2 (two) times daily. 11/30/14   Annita Brod, MD  DULoxetine (CYMBALTA) 60 MG capsule Take 60 mg by mouth 2 (two) times daily.    Historical Provider, MD  furosemide (LASIX) 40 MG tablet Take 40 mg by mouth daily.    Historical Provider, MD  Gabapentin, PHN, (GRALISE) 600 MG TABS Take 1 tablet by mouth at bedtime.     Historical Provider, MD  levothyroxine (SYNTHROID, LEVOTHROID) 25 MCG tablet Take 25 mcg by mouth daily before breakfast.    Historical Provider, MD  LORazepam (ATIVAN) 1 MG tablet Take 1 mg by mouth at bedtime.     Historical Provider, MD  Multiple Vitamins-Minerals (CENTRUM SILVER ADULT 50+) TABS Take 1 tablet by mouth daily.    Historical Provider, MD  pregabalin (LYRICA) 150 MG capsule Take 150 mg by mouth 2 (two) times daily.    Historical Provider, MD  pregabalin (LYRICA) 50 MG capsule Take 50 mg by mouth 3 (three) times daily.    Historical Provider, MD  Simethicone (GAS-X ULTRA STRENGTH) 180 MG CAPS Take 180 mg by mouth at bedtime.    Historical Provider, MD  Zinc 50 MG CAPS Take 100 mg by mouth daily.     Historical Provider, MD  zolpidem (AMBIEN) 5 MG tablet Take 5 mg by mouth at bedtime.    Historical Provider, MD    ALLERGIES:  Allergies  Allergen  Reactions  . Demerol [Meperidine] Other (See Comments)    Turned patient blue and couldn't breathe  . Morphine And Related Other (See Comments)    Turned patient blue and couldn't breathe   . Macrolides And Ketolides Nausea Only    SOCIAL HISTORY:  History  Substance Use Topics  . Smoking status: Former Smoker -- 1.00 packs/day    Types: Cigarettes    Quit date: 12/09/1973  . Smokeless tobacco: Never Used  . Alcohol Use: No     Comment: quit: 1982    FAMILY HISTORY: Family History  Problem Relation Age of Onset  . Arthritis Mother   . Thyroid disease Son   . Breast cancer Sister     EXAM: BP 81/30 mmHg  Pulse 75  Temp(Src) 98.4 F (36.9 C) (Oral)  Resp 20  SpO2 88% CONSTITUTIONAL: Alert and  oriented and responds appropriately to questions. Well-appearing; well-nourished HEAD: Normocephalic EYES: Conjunctivae clear, PERRL ENT: normal nose; no rhinorrhea; dry  mucous membranes; pharynx without lesions noted NECK: Supple, no meningismus, no LAD; no midline spinal tenderness or step-off or deformity CARD: RRR; S1 and S2 appreciated; no murmurs, no clicks, no rubs, no gallops RESP: Normal chest excursion without splinting or tachypnea; breath sounds equal bilaterally; no wheezes, no rhonchi, pt is hypoxic, bibasilar rales ABD/GI: Normal bowel sounds; non-distended; soft, mildly tender to palpation in the right upper quadrant without voluntary guarding or rebound, no peritoneal signs BACK:  The back appears normal and is non-tender to palpation, there is no CVA tenderness; no midline spinal tennis or step-off or deformity, she does have an abrasion to her thoracic spine but is not tender over this area EXT: Normal ROM in all joints; non-tender to palpation; no edema; normal capillary refill; no cyanosis; no calf tenderness, 2+ radial and DP pulses bilaterally    SKIN: Normal color for age and race; warm; bilateral venous stasis dermatitis NEURO: Moves all extremities equally; sensation to light touch intact diffusely; CN 2-12 intact PSYCH: The patient's mood and manner are appropriate. Grooming and personal hygiene are appropriate.  MEDICAL DECISION MAKING: Patient here with multiple falls. She is hypotensive and hypoxic. We'll give IV fluids. EKG shows no ischemic changes. Patient does appear dry on exam. Will obtain infectious workup with rectal temperature, cultures, urine and chest x-ray. Patient will need admission. No obvious sign of injury from her falls except for an abrasion to her midthoracic spine with no midline spinal tenderness.  ED PROGRESS: Patient's blood pressure is now 115/39 after IV fluids, heart rate in the 70s, still satting 98% on 2 L.  Lactate normal.      Labs  show leukopenia. Creatinine normal. Urine shows leukocytes, hematuria, bacteria. Culture pending. We'll give ceftriaxone. Lactate normal. Blood pressure stable. Patient reports similar symptoms with prior UTIs. Will admit for IV hydration and antibiotics. Discussed with Dr. Carles Collet with hospitalist service who agrees for full admission, telemetry bed. Will place holding orders.      EKG Interpretation  Date/Time:  Thursday February 09 2015 09:42:56 EST Ventricular Rate:  72 PR Interval:  169 QRS Duration: 85 QT Interval:  426 QTC Calculation: 466 R Axis:   -39 Text Interpretation:  Sinus rhythm Ventricular premature complex Left axis deviation Low voltage, precordial leads No significant change since last tracing Confirmed by Vicki Limones,  DO, Vicki Bauer [phone]) on 02/09/2015 10:17:13 AM         Vicki Bison [place], DO 02/09/15 1301   CRITICAL CARE Performed by: Nyra Jabs  Total critical care time: 45 minutes  Critical care time was exclusive of separately billable procedures and treating other patients.  Critical care was necessary to treat or prevent imminent or life-threatening deterioration.  Critical care was time spent personally by me on the following activities: development of treatment plan with patient and/or surrogate as well as nursing, discussions with consultants, evaluation of patient's response to treatment, examination of patient, obtaining history from patient or surrogate, ordering and performing treatments and interventions, ordering and review of laboratory studies, ordering and review of radiographic studies, pulse oximetry and re-evaluation of patient's condition.   [place], DO 02/09/15 1303

## 2015-02-09 NOTE — ED Notes (Signed)
Please disregard this Pt and encounter.  A new chart was created on an existing Pt.

## 2015-02-09 NOTE — ED Notes (Signed)
Per EMS, Pt tripped while getting out of bed and c/o being weaker then normal last few days.  Denies neck and back pain.  Sts she feels like she has a UTI.

## 2015-02-09 NOTE — Progress Notes (Signed)
VASCULAR LAB PRELIMINARY  PRELIMINARY  PRELIMINARY  PRELIMINARY  Bilateral lower extremity venous duplex completed.    Preliminary report:  Bilateral:  No evidence of DVT, superficial thrombosis, or Baker's Cyst.   Kenlea Woodell, RVS 02/09/2015, 2:49 PM

## 2015-02-09 NOTE — ED Notes (Signed)
Bed: QB16 Expected date: 02/09/15 Expected time:  Means of arrival:  Comments: EMS  Fall possible UTI

## 2015-02-09 NOTE — ED Provider Notes (Signed)
Wrong patient registered. Patient not seen.  Mead, DO 02/09/15 1130

## 2015-02-09 NOTE — Progress Notes (Signed)
Patient's son Gershon Mussel called to see how the patient was doing in the hospital.  The patient's son was aware that the patient was somewhat confused at the moment. RN stated that she would check on the patient and try to help her have a good rest tonight.

## 2015-02-09 NOTE — ED Notes (Signed)
Pt got out of bed and tripped. Pt has been weaker then normal last few days, denies neck or back pain. Pt feels like she has UTI.

## 2015-02-09 NOTE — H&P (Signed)
History and Physical  Vicki Bauer XBM:[id number] DOB: [birth year] DOA: 02/09/2015   PCP: Lilian Coma, MD   Chief Complaint: Mechanical falls, lethargy, word finding difficulty  HPI:  81 year old female with a history of hypertension, hypothyroidism, CAD, and obstructive sleep apnea presents with mechanical falls 2 on the morning of admission. Because of her weakness and mechanical falls, she activated her medical alert button. This history is supplemented by the patient's son. Apparently the patient has been having word finding difficulty for 3-4 weeks. In addition, the patient has had mechanical falls in the recent past, but she usually does not tell her son. The patient normally uses the assistance of a walker, but on the morning of admission, she fell without using her walker. There was no syncope. The patient denies any headache, visual disturbance, focal extremity weakness, dysarthria. She was discharged from the hospital on 12/02/2014 after a workup for stroke secondary to word finding difficulties. It was found at that time that her MRI of the brain was negative and her word finding difficulties were likely due to UTI. The patient presently denies any chest pain, shortness breath, coughing, hemoptysis, nausea, vomiting, diarrhea, abdominal pain, dysuria, hematuria. She complains of some right hip pain from her mechanical fall. The patient has not been started on any antibiotics or any new medications recently. For the past 3-4 weeks, the patient's son has also noted that she has had some more fatigue and lethargy associated with her word finding difficulties.  In the emergency department, the patient was noted to have WBC 2.3, hemoglobin 11.9, plt 43,000. Serum creatinine was 1.05. Her electrolytes and liver enzymes were negative. Urinalysis does not show significant pyuria. Initial blood pressure in the emergency department was 81/30. It improved to 107/53 with fluid resuscitation.  Lactic acid was 0.9.  Oxygen sat 100% on RA when rechecked. Assessment/Plan: Sepsis  -present at the time of admission -source may be cellulitis of R-leg--although the patient has numerous bruises and petechiae, the medial aspect of her right thigh and calf are erythematous -As the patient was hypotensive upon presentation with pancytopenia and AKI, we'll treat her aggressively for sepsis -Intravenous vancomycin and Zosyn pending culture data -Urinalysis does not suggest UTI -Chest x-ray negative for consolidation -Blood cultures have been obtained -IV fluids -hold lasix, Zebeta Cellulitis R-leg -Although it may be difficult to discern in the mist of the patient's trachea and other bruises, it does appear she has some increased erythema that is blanchable on her right thigh and right calf -Antibiotics as discussed -Venous duplex r/o DVT Dysphasia/Altered mental Status -CT brain without contrast -Doubt acute stroke as son states this has been present 3-4 weeks -may be related to infection vs meds vs AKI -11/30/2014 MRI brain negative for stroke -EKG -hold cymbalta, gabapentin AKI -due to volume depletion -IVF -baseline creatinine 0.7 Pancytopenia -may be related to infection -B12, RBC folate -concerned pt may have underlying autoimmune issue -hematology eval if not improvemet -Check PT/INR/PTT/ fibrinogen/peripheral smear Frequent Falls -CT brain -PT eval R-hip pain -R hip xray         Past Medical History  Diagnosis Date  . Hypertension   . Hypothyroidism   . Macular degeneration   . Cancer     lymphoma  . Insomnia   . OAB (overactive bladder)   . CAD (coronary artery disease) 1990    s/p stent x2,   Past Surgical History  Procedure Laterality Date  . Tonsillectomy and adenoidectomy    .  Hysterotomy    . Replacement total knee bilateral    . Lymph gland excision Bilateral   . Cholecystectomy    . Cataract extraction Bilateral   . Appendectomy    .  Ptca    . Back surgery     Social History:  reports that she quit smoking about 41 years ago. Her smoking use included Cigarettes. She smoked 1.00 pack per day. She has never used smokeless tobacco. She reports that she does not drink alcohol or use illicit drugs.   Family History  Problem Relation Age of Onset  . Arthritis Mother   . Thyroid disease Son   . Breast cancer Sister      Allergies  Allergen Reactions  . Demerol [Meperidine] Other (See Comments)    Turned patient blue and couldn't breathe  . Morphine And Related Other (See Comments)    Turned patient blue and couldn't breathe   . Macrolides And Ketolides Nausea Only      Prior to Admission medications   Medication Sig Start Date End Date Taking? Authorizing Provider  bisoprolol (ZEBETA) 5 MG tablet Take 5 mg by mouth daily.   Yes Historical Provider, MD  buPROPion (WELLBUTRIN SR) 200 MG 12 hr tablet Take 200 mg by mouth 2 (two) times daily.   Yes Historical Provider, MD  DULoxetine (CYMBALTA) 60 MG capsule Take 60 mg by mouth 2 (two) times daily.   Yes Historical Provider, MD  furosemide (LASIX) 40 MG tablet Take 40 mg by mouth daily.   Yes Historical Provider, MD  Gabapentin, PHN, (GRALISE) 600 MG TABS Take 1 tablet by mouth at bedtime.    Yes Historical Provider, MD  levothyroxine (SYNTHROID, LEVOTHROID) 25 MCG tablet Take 25 mcg by mouth daily before breakfast.   Yes Historical Provider, MD  LORazepam (ATIVAN) 1 MG tablet Take 1 mg by mouth at bedtime.    Yes Historical Provider, MD  Multiple Vitamins-Minerals (CENTRUM SILVER ADULT 50+) TABS Take 1 tablet by mouth daily.   Yes Historical Provider, MD  Oxycodone HCl 10 MG TABS Take 10 mg by mouth every 4 (four) hours as needed (pain).   Yes Historical Provider, MD  pregabalin (LYRICA) 150 MG capsule Take 150 mg by mouth 2 (two) times daily.   Yes Historical Provider, MD  Simethicone (GAS-X ULTRA STRENGTH) 180 MG CAPS Take 180 mg by mouth at bedtime.   Yes Historical  Provider, MD  Zinc 100 MG TABS Take 1 tablet by mouth every morning.   Yes Historical Provider, MD  zolpidem (AMBIEN) 5 MG tablet Take 5 mg by mouth at bedtime.   Yes Historical Provider, MD  ciprofloxacin (CIPRO) 500 MG tablet Take 1 tablet (500 mg total) by mouth 2 (two) times daily. 11/30/14   Annita Brod, MD    Review of Systems:  Constitutional:  No weight loss, night sweats, Fevers, chills, fatigue.  Head&Eyes: No headache.  No vision loss.  No eye pain or scotoma ENT:  No Difficulty swallowing,Tooth/dental problems,Sore throat,  No ear ache, post nasal drip,  Cardio-vascular:  No chest pain, Orthopnea, PND, swelling in lower extremities,  dizziness, palpitations  GI:  No  abdominal pain, nausea, vomiting, diarrhea, loss of appetite, hematochezia, melena, heartburn, indigestion, Resp:  No shortness of breath with exertion or at rest. No cough. No coughing up of blood .No wheezing.No chest wall deformity  Skin:  no rash or lesions.  GU:  no dysuria, change in color of urine, no urgency or frequency. No flank  pain.  Musculoskeletal:  No joint pain or swelling. No decreased range of motion. No back pain.  Psych:  No change in mood or affect. No depression or anxiety. Neurologic: No headache, no dysesthesia, no focal weakness, no vision loss. No syncope  Physical Exam: Filed Vitals:   02/09/15 1245 02/09/15 1259 02/09/15 1309 02/09/15 1315  BP: 119/36   112/42  Pulse:    76  Temp:      TempSrc:      Resp: 32 18  19  SpO2:   98%    General:  A&O x 2, NAD, nontoxic, pleasant/cooperative Head/Eye: No conjunctival hemorrhage, no icterus, [place]/AT, No nystagmus ENT:  No icterus,  No thrush, edentulous, no pharyngeal exudate Neck:  No masses, no lymphadenpathy, no bruits CV:  RRR, no rub, no gallop, no S3 Lung:  Bibasilar crackles. No wheezes. Good air movement Abdomen: soft/NT, +BS, nondistended, no peritoneal signs Ext: 1+ LE edema. Scattered patches of erythema in  the right medial thigh and calf area. There is scattered petechiae and ecchymoses throughout the bilateral upper and lower extremities. Neuro: CNII-XII intact, strength 4/5 in bilateral upper and lower extremities, no dysmetria  Labs on Admission:  Basic Metabolic Panel:  Recent Labs Lab 02/09/15 1038  NA 139  K 4.2  CL 108  CO2 24  GLUCOSE 95  BUN 24*  CREATININE 1.05  CALCIUM 7.9*   Liver Function Tests:  Recent Labs Lab 02/09/15 1038  AST 26  ALT 16  ALKPHOS 107  BILITOT 0.8  PROT 4.7*  ALBUMIN 2.8*    Recent Labs Lab 02/09/15 1038  LIPASE 21   No results for input(s): AMMONIA in the last 168 hours. CBC:  Recent Labs Lab 02/09/15 1038  WBC 2.3*  NEUTROABS 1.2*  HGB 11.9*  HCT 37.8  MCV 89.6  PLT 43*   Cardiac Enzymes: No results for input(s): CKTOTAL, CKMB, CKMBINDEX, TROPONINI in the last 168 hours. BNP: Invalid input(s): POCBNP CBG: No results for input(s): GLUCAP in the last 168 hours.  Radiological Exams on Admission: Dg Chest 2 View  02/09/2015   CLINICAL DATA:  Patient fell earlier today.  History of lymphoma  EXAM: CHEST  2 VIEW  COMPARISON:  November 29, 2014  FINDINGS: There is no edema or consolidation. Heart is upper normal in size with pulmonary vascularity within normal limits. No adenopathy. There is atherosclerotic change in the aorta. No bone lesions. No pneumothorax.  IMPRESSION: No edema or consolidation.  No adenopathy.   Electronically Signed   By: Lowella Grip III M.D.   On: 02/09/2015 11:30       Time spent:70 minutes Code Status:   FULL Family Communication:   Son updated on phone   Ajiah Mcglinn, DO  [facility] Pager [phone]  If 7PM-7AM, please contact night-coverage [email or web address] Password [email or web address] 02/09/2015, 1:36 PM

## 2015-02-09 NOTE — Progress Notes (Signed)
ANTIBIOTIC CONSULT NOTE - INITIAL  Pharmacy Consult for Vancomycin Indication: Cellulitis  Allergies  Allergen Reactions  . Demerol [Meperidine] Other (See Comments)    Turned patient blue and couldn't breathe  . Morphine And Related Other (See Comments)    Turned patient blue and couldn't breathe   . Macrolides And Ketolides Nausea Only    Patient Measurements: Height: 5\' 2"  (157.5 cm) Weight: 160 lb 3.2 oz (72.666 kg) IBW/kg (Calculated) : 50.1  Vital Signs: Temp: 98 F (36.7 C) (03/03 1405) Temp Source: Oral (03/03 1405) BP: 128/41 mmHg (03/03 1405) Pulse Rate: 71 (03/03 1405) Intake/Output from previous day:   Intake/Output from this shift:    Labs:  Recent Labs  02/09/15 1038  WBC 2.3*  HGB 11.9*  PLT 43*  CREATININE 1.05   Estimated Creatinine Clearance: 37.2 mL/min (by C-G formula based on Cr of 1.05). No results for input(s): VANCOTROUGH, VANCOPEAK, VANCORANDOM, GENTTROUGH, GENTPEAK, GENTRANDOM, TOBRATROUGH, TOBRAPEAK, TOBRARND, AMIKACINPEAK, AMIKACINTROU, AMIKACIN in the last 72 hours.   Microbiology: No results found for this or any previous visit (from the past 720 hour(s)).  Medical History: Past Medical History  Diagnosis Date  . Hypertension   . Hypothyroidism   . Macular degeneration   . Cancer     lymphoma  . Insomnia   . OAB (overactive bladder)   . CAD (coronary artery disease) 1990    s/p stent x2,    Assessment: 33 yoF admitted 3/3 for mechanical fall, lethargy, dysphagia, and cellulitis of right leg with sepsis.   3/3 >> ceftriaxone x1 3/3 >> Zosyn >> 3/3 >> Vanc >>  Today, 02/09/2015:  Tmax: 99.3  WBCs: 2.3  Renal: SCr 1.05, CrCl ~ 37 ml/min  Blood and urine cultures pending   Goal of Therapy:  Vancomycin trough level 10-15 mcg/ml  Plan:   Vancomycin 750mg  IV q24h.  Measure Vanc trough at steady state.  Follow up renal fxn, culture results, and clinical course.   Gretta Arab PharmD, BCPS Pager  [phone] 02/09/2015 2:37 PM

## 2015-02-10 ENCOUNTER — Inpatient Hospital Stay (HOSPITAL_COMMUNITY): Payer: MEDICARE

## 2015-02-10 DIAGNOSIS — R233 Spontaneous ecchymoses: Secondary | ICD-10-CM

## 2015-02-10 DIAGNOSIS — G9341 Metabolic encephalopathy: Secondary | ICD-10-CM | POA: Diagnosis present

## 2015-02-10 DIAGNOSIS — N179 Acute kidney failure, unspecified: Secondary | ICD-10-CM

## 2015-02-10 DIAGNOSIS — D696 Thrombocytopenia, unspecified: Secondary | ICD-10-CM | POA: Diagnosis present

## 2015-02-10 LAB — CBC
HCT: 35.1 % — ABNORMAL LOW (ref 36.0–46.0)
HCT: 35.4 % — ABNORMAL LOW (ref 36.0–46.0)
HEMOGLOBIN: 11.1 g/dL — AB (ref 12.0–15.0)
Hemoglobin: 11.1 g/dL — ABNORMAL LOW (ref 12.0–15.0)
MCH: 28 pg (ref 26.0–34.0)
MCH: 27.6 pg (ref 26.0–34.0)
MCHC: 31.6 g/dL (ref 30.0–36.0)
MCHC: 31.4 g/dL (ref 30.0–36.0)
MCV: 88.4 fL (ref 78.0–100.0)
MCV: 88.1 fL (ref 78.0–100.0)
Platelets: 27 10*3/uL — CL (ref 150–400)
Platelets: 33 10*3/uL — ABNORMAL LOW (ref 150–400)
RBC: 3.97 MIL/uL (ref 3.87–5.11)
RBC: 4.02 MIL/uL (ref 3.87–5.11)
RDW: 16.2 % — AB (ref 11.5–15.5)
RDW: 16 % — ABNORMAL HIGH (ref 11.5–15.5)
WBC: 3 10*3/uL — AB (ref 4.0–10.5)
WBC: 2.9 10*3/uL — ABNORMAL LOW (ref 4.0–10.5)

## 2015-02-10 LAB — COMPREHENSIVE METABOLIC PANEL
ALK PHOS: 85 U/L (ref 39–117)
ALT: 15 U/L (ref 0–35)
ANION GAP: 7 (ref 5–15)
AST: 23 U/L (ref 0–37)
Albumin: 2.7 g/dL — ABNORMAL LOW (ref 3.5–5.2)
BUN: 25 mg/dL — AB (ref 6–23)
CO2: 25 mmol/L (ref 19–32)
Calcium: 7.9 mg/dL — ABNORMAL LOW (ref 8.4–10.5)
Chloride: 108 mmol/L (ref 96–112)
Creatinine, Ser: 1.08 mg/dL (ref 0.50–1.10)
GFR, EST AFRICAN AMERICAN: 53 mL/min — AB (ref >90)
GFR, EST NON AFRICAN AMERICAN: 46 mL/min — AB (ref >90)
GLUCOSE: 94 mg/dL (ref 70–99)
POTASSIUM: 3.5 mmol/L (ref 3.5–5.1)
Sodium: 140 mmol/L (ref 135–145)
TOTAL PROTEIN: 4.5 g/dL — AB (ref 6.0–8.3)
Total Bilirubin: 1 mg/dL (ref 0.3–1.2)

## 2015-02-10 LAB — DIC (DISSEMINATED INTRAVASCULAR COAGULATION) PANEL
D DIMER QUANT: 7.21 ug{FEU}/mL — AB (ref 0.00–0.48)
FIBRINOGEN: 310 mg/dL (ref 204–475)
SMEAR REVIEW: NONE SEEN
aPTT: 25 seconds (ref 24–37)

## 2015-02-10 LAB — SAVE SMEAR

## 2015-02-10 LAB — FOLATE: Folate: 18.6 ng/mL

## 2015-02-10 LAB — IRON AND TIBC
Iron: <10 ug/dL — ABNORMAL LOW (ref 42–145)
UIBC: 149 ug/dL (ref 125–400)

## 2015-02-10 LAB — RETICULOCYTES
RBC.: 4.09 MIL/uL (ref 3.87–5.11)
RETIC COUNT ABSOLUTE: 94.1 10*3/uL (ref 19.0–186.0)
Retic Ct Pct: 2.3 % (ref 0.4–3.1)

## 2015-02-10 LAB — DIC (DISSEMINATED INTRAVASCULAR COAGULATION)PANEL
INR: 1.11 (ref 0.00–1.49)
Platelets: UNDETERMINED 10*3/uL (ref 150–400)
Prothrombin Time: 14.4 seconds (ref 11.6–15.2)

## 2015-02-10 LAB — FERRITIN: Ferritin: 138 ng/mL (ref 10–291)

## 2015-02-10 LAB — VITAMIN B12: Vitamin B-12: 324 pg/mL (ref 211–911)

## 2015-02-10 MED ORDER — SODIUM CHLORIDE 0.9 % IV SOLN
INTRAVENOUS | Status: DC
  Administered 2015-02-10 (×2): via INTRAVENOUS

## 2015-02-10 MED ORDER — SACCHAROMYCES BOULARDII 250 MG PO CAPS
250.0000 mg | ORAL_CAPSULE | Freq: Two times a day (BID) | ORAL | Status: DC
  Administered 2015-02-10 – 2015-02-17 (×15): 250 mg via ORAL
  Filled 2015-02-10 (×17): qty 1

## 2015-02-10 MED ORDER — CYANOCOBALAMIN 1000 MCG/ML IJ SOLN
1000.0000 ug | Freq: Every day | INTRAMUSCULAR | Status: AC
  Administered 2015-02-10 – 2015-02-16 (×7): 1000 ug via INTRAMUSCULAR
  Filled 2015-02-10 (×7): qty 1

## 2015-02-10 NOTE — Progress Notes (Addendum)
CRITICAL VALUE ALERT  Critical value received:  Platelets 27  Date of notification:  02/10/2015  Time of notification: 0511  Critical value read back:yes  Nurse who received alert:  Dellie Catholic  MD notified (1st page): yes  Time of first page: 0512  Time of 2nd page- 740 (KD, RN) - Made day MD aware.

## 2015-02-10 NOTE — Clinical Documentation Improvement (Addendum)
"  Altered mental status", "son reports more fatigue and lethargy", hypoxia noted in ED note, CT brain ordered for altered mental status.  Please identify if any additional clinical conditions exist associated with the altered mental status.    Possible Clinical Conditions: -encephalopathy (if present, please specify acuity and type) -other condition (please specify) -unable to determine at present  Thank you, Mateo Flow, RN [phone] Clinical Documentation Specialist

## 2015-02-10 NOTE — Progress Notes (Addendum)
[facility] PROGRESS NOTE  Assessment/Plan: Thrombocytopenia/  Pancytopenia: - Her platelets were low since last time she was in the hospital, it was 106 back and now is 27, her B-12 is low she has an elevated RDW with a low MCV. Which could signify a mixed iron deficiency anemia and B-12 or folate deficiency. - I will go ahead and start her on B-12 replacement therapy.  - She was altered, with new acute renal failure, thrombocytopenia; Check a peripheral smear and DIC panel. We'll also check an anemia panel. ANA has already been sent. Hepatic function panel done on admission does not show elevated AST ALT her bilirubin. - There is more than a 50% decrease in his platelets, greater than 10 days since she has been exposed to heparin, nose and necrosis and there is mild acute renal failure. - No signs of bleeding.  SIRS/  Cellulitis, leg: - Her blood pressure improved after boluses of normal saline and ongoing normal saline infusion. - SHe is being covered broadly with vancomycin and Zosyn. He did have a mild left shift on admission, but no fever. - Her UA is unimpressive for UTI. Chest x-ray shows no infiltrate. Her calcitonin is less than 0.5 - Her lower extremity; petechiae rash which extends to her back and is also on her left lower extremity. I will go ahead and hold antibiotics. Start on [employer].  Acute kidney injury: - Creatinine has not improve with IV hydration. - Continue IV fluids. Get a renal ultrasound.  Right hip pain: - No Fractures. Consult PT/OT.  AKI (acute kidney injury): - Unclear etiology urinary sodium and urinary creatinine were not sent on admission she was started on IV fluids. - She is on Lasix at home which could be controlling to decreased intravascular volume. - In the differential could be as it is explaining in thrombocytopenia. - Continue IV fluids continue bisoprolol.  Essential hypertension: - Hold Lasix continue the  Bisoprolol.  Hypothyroidism: - Check a TSH. - Continue Synthroid.  Acute encephalopathy: - It is now resolved. She could've been having delirium. She is able to carry on a conversation and be truthful about missing her potassium pills as the pills are so big.  Code Status: full Family Communication:none Disposition Plan: inpatient   Consultants:  none  Procedures:  Ct head  Antibiotics:  Vancomycin and Zosyn started on 03/08/2015-3.4.2016.  HPI/Subjective: She is able to have a conversation she is complaining about left hip pain.  Objective: Filed Vitals:   02/09/15 1732 02/09/15 2043 02/10/15 0159 02/10/15 0418  BP: 149/46 136/47 118/64 116/48  Pulse: 82 80 77 72  Temp: 98.9 F (37.2 C) 98.4 F (36.9 C) 97.7 F (36.5 C) 97.3 F (36.3 C)  TempSrc: Oral Oral Axillary Axillary  Resp:  19 18 18   Height:      Weight:    73.301 kg (161 lb 9.6 oz)  SpO2: 96% 96% 98% 98%    Intake/Output Summary (Last 24 hours) at 02/10/15 0841 Last data filed at 02/10/15 0650  Gross per 24 hour  Intake 1387.5 ml  Output      0 ml  Net 1387.5 ml   Filed Weights   02/09/15 1405 02/10/15 0418  Weight: 72.666 kg (160 lb 3.2 oz) 73.301 kg (161 lb 9.6 oz)    Exam:  General: Alert, awake, oriented x3, in no acute distress.  HEENT: No bruits, no goiter.  Heart: Regular rate and rhythm. Lungs: Good air movement, clear Abdomen: Soft, nontender, nondistended, positive  bowel sounds.  Neuro: Grossly intact, nonfocal. Skin: Petechiae rash throughout her lower extremities and back.   Data Reviewed: Basic Metabolic Panel:  Recent Labs Lab 02/09/15 1038 02/10/15 0407  NA 139 140  K 4.2 3.5  CL 108 108  CO2 24 25  GLUCOSE 95 94  BUN 24* 25*  CREATININE 1.05 1.08  CALCIUM 7.9* 7.9*   Liver Function Tests:  Recent Labs Lab 02/09/15 1038 02/10/15 0407  AST 26 23  ALT 16 15  ALKPHOS 107 85  BILITOT 0.8 1.0  PROT 4.7* 4.5*  ALBUMIN 2.8* 2.7*    Recent Labs Lab  02/09/15 1038  LIPASE 21   No results for input(s): AMMONIA in the last 168 hours. CBC:  Recent Labs Lab 02/09/15 1038 02/10/15 0407  WBC 2.3* 3.0*  NEUTROABS 1.2*  --   HGB 11.9* 11.1*  HCT 37.8 35.1*  MCV 89.6 88.4  PLT 43* 27*   Cardiac Enzymes: No results for input(s): CKTOTAL, CKMB, CKMBINDEX, TROPONINI in the last 168 hours. BNP (last 3 results) No results for input(s): BNP in the last 8760 hours.  ProBNP (last 3 results) No results for input(s): PROBNP in the last 8760 hours.  CBG: No results for input(s): GLUCAP in the last 168 hours.  No results found for this or any previous visit (from the past 240 hour(s)).   Studies: Dg Chest 2 View  02/09/2015   CLINICAL DATA:  Patient fell earlier today.  History of lymphoma  EXAM: CHEST  2 VIEW  COMPARISON:  November 29, 2014  FINDINGS: There is no edema or consolidation. Heart is upper normal in size with pulmonary vascularity within normal limits. No adenopathy. There is atherosclerotic change in the aorta. No bone lesions. No pneumothorax.  IMPRESSION: No edema or consolidation.  No adenopathy.   Electronically Signed   By: Lowella Grip III M.D.   On: 02/09/2015 11:30   Ct Head Wo Contrast  02/09/2015   CLINICAL DATA:  Multiple falls. Patient with lethargy and difficulty with word-finding. Weakness.  EXAM: CT HEAD WITHOUT CONTRAST  TECHNIQUE: Contiguous axial images were obtained from the base of the skull through the vertex without intravenous contrast.  COMPARISON:  Brain MRI, 11/30/2014.  Head CT, 11/29/2014.  FINDINGS: Ventricles are normal in configuration. There is ventricular and sulcal enlargement reflecting mild diffuse atrophy. No hydrocephalus.  No parenchymal masses or mass effect. Patchy areas of white matter hypoattenuation noted consistent with moderate chronic microvascular ischemic change.  No evidence of a cortical infarct.  There are no extra-axial masses or abnormal fluid collections.  There is no  intracranial hemorrhage.  Visualized sinuses and mastoid air cells are clear.  No change from the prior studies.  IMPRESSION: 1. No acute intracranial abnormalities. 2. Mild atrophy and moderate chronic microvascular ischemic change, stable from the recent prior exams.   Electronically Signed   By: Lajean Manes M.D.   On: 02/09/2015 16:16   Dg Hip Unilat With Pelvis 2-3 Views Right  02/09/2015   CLINICAL DATA:  Pt fell today at home in her bedroom. Pt c/o right hip pain. No known prior injury to hip. Per EMS, Pt tripped while getting out of bed and c/o being weaker then normal last few days. Denies neck and back pain. Sts she feels like she has a UTI.  EXAM: RIGHT HIP (WITH PELVIS) 2-3 VIEWS  COMPARISON:  None.  FINDINGS: No fracture or dislocation. Hip joints are normally spaced and aligned. There are no significant arthropathic changes.  Bony pelvis is intact.  Bones are diffusely demineralized.  Soft tissues are unremarkable.  IMPRESSION: No fracture or acute finding.   Electronically Signed   By: Lajean Manes M.D.   On: 02/09/2015 16:36    Scheduled Meds: . buPROPion  200 mg Oral BID  . levothyroxine  25 mcg Oral QAC breakfast  . LORazepam  1 mg Oral QHS  . multivitamin with minerals  1 tablet Oral Daily  . piperacillin-tazobactam  3.375 g Intravenous 3 times per day  . pregabalin  150 mg Oral BID  . sodium chloride  3 mL Intravenous Q12H  . vancomycin  750 mg Intravenous Q24H   Continuous Infusions: . sodium chloride Stopped (02/09/15 1415)  . sodium chloride 75 mL/hr at 02/09/15 2303     Charlynne Cousins  [facility] Pager [phone]. If 7PM-7AM, please contact night-coverage at [email or web address], password [facility] 02/10/2015, 8:41 AM  LOS: 1 day

## 2015-02-10 NOTE — Progress Notes (Signed)
Lab. notified RN that the Platelet count was 27.  Will notify the PCP on call.

## 2015-02-10 NOTE — Care Management Note (Signed)
    Page 1 of 1   02/10/2015     12:41:43 PM CARE MANAGEMENT NOTE 02/10/2015  Patient:  Vicki Bauer, Vicki Bauer   Account Number:  [id number]  Date Initiated:  02/10/2015  Documentation initiated by:  Dessa Phi  Subjective/Objective Assessment:   81 y/o f admitted w/mechanical falls, lethargy,thrombocytopenia.SW:NIOEVOJJ, OSA,multiple med co morbidities.     Action/Plan:   From home w/son.Home CPAP-broken-must call company to fix.   Anticipated DC Date:  02/14/2015   Anticipated DC Plan:  [place]  CM consult      Choice offered to / List presented to:             Status of service:  In process, will continue to follow Medicare Important Message given?   (If response is "NO", the following Medicare IM given date fields will be blank) Date Medicare IM given:   Medicare IM given by:   Date Additional Medicare IM given:   Additional Medicare IM given by:    Discharge Disposition:    Per UR Regulation:  Reviewed for med. necessity/level of care/duration of stay  If discussed at [place] of Stay Meetings, dates discussed:    Comments:  02/10/15 Dessa Phi RN BSN NCM [phone] Spoke to patient about home CPAP-patient states CPAP machine is broken, & she has paid for it,company-[employer]. Informed patient to contact company [employer] for service repair or process for repairs.Patient will have her son to f/u. Patient voiced understanding.Await PT recommendations.

## 2015-02-10 NOTE — Evaluation (Signed)
Physical Therapy Evaluation Patient Details Name: IVET GUERRIERI MRN: [id number] DOB: 1928/07/29 Today's Date: 02/10/2015   History of Present Illness  81 yo female admitted with UTI, falls, LE cellulitis, sepsis. Hx of HTN, CAD  Clinical Impression  On eval, pt required Min assist for mobility-able to ambulate ~10 feet in room with RW. Remained in room due to pt having loose stools. Pt tolerated session well. Discussed d/c plan-pt states she will return home. Recommend increased supervision/assist initially.     Follow Up Recommendations Home health PT;Supervision/Assistance - 24 hour (initially); HHOT; Home health aide if possible     Equipment Recommendations  None recommended by PT    Recommendations for Other Services OT consult     Precautions / Restrictions Precautions Precautions: Fall Restrictions Weight Bearing Restrictions: No      Mobility  Bed Mobility Overal bed mobility: Needs Assistance Bed Mobility: Supine to Sit     Supine to sit: Mod assist;HOB elevated     General bed mobility comments: Assist for trunk to upright and to get to EOB. Utilized bedpad. Increased time.  Transfers Overall transfer level: Needs assistance Equipment used: Rolling walker (2 wheeled) Transfers: Sit to/from [facility] Sit to Stand: Min assist Stand pivot transfers: Min assist       General transfer comment: Assist to rise, stabilize, control descent. VCs safety, hand placement. Stand pivot from bed to [facility] with RW. Pt with loose stool on way to [facility]  Ambulation/Gait Ambulation/Gait assistance: Min assist Ambulation Distance (Feet): 10 Feet (in room. Stayed in room due to loose stools) Assistive device: Rolling walker (2 wheeled) Gait Pattern/deviations: Step-through pattern;Decreased stride length;Trunk flexed     General Gait Details: Assist to stabilize. Pt mobilized well with walker.   Stairs            Wheelchair Mobility    Modified  Rankin (Stroke Patients Only)       Balance Overall balance assessment: Needs assistance         Standing balance support: Bilateral upper extremity supported;During functional activity Standing balance-Leahy Scale: Poor                               Pertinent Vitals/Pain Pain Assessment: 0-10 Pain Score: 5  Pain Location: LEs Pain Descriptors / Indicators: Sore Pain Intervention(s): Monitored during session;Repositioned    Home Living Family/patient expects to be discharged to:: Private residence Living Arrangements: Children (son works 7-5) Available Help at Discharge: Family Type of Home: House Home Access: Stairs to enter   Technical brewer of Steps: 2 Home Layout: One level Home Equipment: Environmental consultant - 2 wheels      Prior Function Level of Independence: Needs assistance   Gait / Transfers Assistance Needed: Pt using RW for ambulation. Reports falls at home.   ADL's / Homemaking Assistance Needed: son does cooking and cleaning and says pt does all bathing/dressing herself  Comments: Pt has life alert necklace.     Hand Dominance        Extremity/Trunk Assessment   Upper Extremity Assessment: Generalized weakness           Lower Extremity Assessment: Generalized weakness      Cervical / Trunk Assessment: Kyphotic  Communication   Communication: No difficulties  Cognition Arousal/Alertness: Awake/alert Behavior During Therapy: WFL for tasks assessed/performed Overall Cognitive Status: Within Functional Limits for tasks assessed  General Comments      Exercises        Assessment/Plan    PT Assessment Patient needs continued PT services  PT Diagnosis Difficulty walking;Generalized weakness;Acute pain   PT Problem List Decreased strength;Decreased activity tolerance;Decreased balance;Decreased mobility;Decreased knowledge of use of DME;Pain  PT Treatment Interventions DME instruction;Gait  training;Functional mobility training;Therapeutic activities;Therapeutic exercise;Patient/family education;Balance training   PT Goals (Current goals can be found in the Care Plan section) Acute Rehab PT Goals Patient Stated Goal: home soon PT Goal Formulation: With patient Time For Goal Achievement: 02/24/15 Potential to Achieve Goals: Good    Frequency Min 3X/week   Barriers to discharge        Co-evaluation               End of Session   Activity Tolerance: Patient tolerated treatment well Patient left: in chair;with call bell/phone within reach (made NT aware)           Time: 2409-7353 PT Time Calculation (min) (ACUTE ONLY): 30 min   Charges:   PT Evaluation $Initial PT Evaluation Tier I: 1 Procedure PT Treatments $Therapeutic Activity: 8-22 mins   PT G Codes:        Weston Anna, MPT Pager: [phone]

## 2015-02-10 NOTE — Progress Notes (Signed)
Pt placed on Auto CPAP 5-15 CMH20 with 2 LPM O2 bleed in via FFM.  Pt tolerating well at this time, RT to monitor and assess as needed.  

## 2015-02-10 NOTE — Progress Notes (Signed)
Brought CPAP to Pt room. Pt states "I'll use it tonight". Assured Pt that RT would help her wear it tonight. Pt understands.

## 2015-02-11 ENCOUNTER — Encounter (HOSPITAL_COMMUNITY): Payer: Self-pay | Admitting: Radiology

## 2015-02-11 LAB — BASIC METABOLIC PANEL
Anion gap: 4 — ABNORMAL LOW (ref 5–15)
BUN: 21 mg/dL (ref 6–23)
CO2: 25 mmol/L (ref 19–32)
CREATININE: 0.91 mg/dL (ref 0.50–1.10)
Calcium: 7.7 mg/dL — ABNORMAL LOW (ref 8.4–10.5)
Chloride: 114 mmol/L — ABNORMAL HIGH (ref 96–112)
GFR calc Af Amer: 65 mL/min — ABNORMAL LOW (ref >90)
GFR calc non Af Amer: 56 mL/min — ABNORMAL LOW (ref >90)
Glucose, Bld: 142 mg/dL — ABNORMAL HIGH (ref 70–99)
POTASSIUM: 3.3 mmol/L — AB (ref 3.5–5.1)
SODIUM: 143 mmol/L (ref 135–145)

## 2015-02-11 LAB — URINE CULTURE

## 2015-02-11 LAB — CLOSTRIDIUM DIFFICILE BY PCR: Toxigenic C. Difficile by PCR: NEGATIVE

## 2015-02-11 LAB — HEPARIN INDUCED THROMBOCYTOPENIA PNL: HEPARIN INDUCED PLT AB: 0.12 {OD_unit} (ref 0.000–0.400)

## 2015-02-11 LAB — LACTATE DEHYDROGENASE: LDH: 162 U/L (ref 94–250)

## 2015-02-11 MED ORDER — LEVOFLOXACIN 750 MG PO TABS
750.0000 mg | ORAL_TABLET | ORAL | Status: DC
  Administered 2015-02-13 – 2015-02-17 (×3): 750 mg via ORAL
  Filled 2015-02-11 (×3): qty 1

## 2015-02-11 MED ORDER — ALUM & MAG HYDROXIDE-SIMETH 200-200-20 MG/5ML PO SUSP
30.0000 mL | ORAL | Status: DC | PRN
  Administered 2015-02-11 – 2015-02-13 (×2): 30 mL via ORAL
  Filled 2015-02-11 (×2): qty 30

## 2015-02-11 MED ORDER — PANTOPRAZOLE SODIUM 40 MG PO TBEC
40.0000 mg | DELAYED_RELEASE_TABLET | Freq: Every day | ORAL | Status: DC
  Administered 2015-02-11 – 2015-02-17 (×7): 40 mg via ORAL
  Filled 2015-02-11 (×7): qty 1

## 2015-02-11 MED ORDER — LEVOFLOXACIN 750 MG PO TABS
750.0000 mg | ORAL_TABLET | Freq: Every day | ORAL | Status: DC
  Administered 2015-02-11: 750 mg via ORAL
  Filled 2015-02-11: qty 1

## 2015-02-11 NOTE — Procedures (Signed)
Pt placed on hospital cpap machine using auto mode at the setting of the min-5 and the max-10.  There is also 2L of oxygen added. The pt has a full face mask on and is resting comfortably at this time.

## 2015-02-11 NOTE — H&P (Signed)
Reason for Consult: Pancytopenia Chief Complaint: Chief Complaint  Patient presents with  . Fall  . Weakness   Referring Physician(s): Oncology  History of Present Illness: Vicki Bauer is a 81 y.o. female admitted after s/p fall and confusion, imaging with no acute findings, patient on antibiotics for right leg cellulitis and found to have pancytopenia, oncology has seen and is requesting bone marrow biopsy, the patient has a history of lymphoma s/p chemotherapy many years ago. She denies any chest pain, shortness of breath or palpitations. She denies any recent fever or chills. The patient does have OSA and uses a CPAP. She denies any known complications to sedation.    Past Medical History  Diagnosis Date  . Hypertension   . Hypothyroidism   . Macular degeneration   . Cancer     lymphoma  . Insomnia   . OAB (overactive bladder)   . CAD (coronary artery disease) 1990    s/p stent x2,    Past Surgical History  Procedure Laterality Date  . Tonsillectomy and adenoidectomy    . Hysterotomy    . Replacement total knee bilateral    . Lymph gland excision Bilateral   . Cholecystectomy    . Cataract extraction Bilateral   . Appendectomy    . Ptca    . Back surgery      Allergies: Demerol; Morphine and related; and Macrolides and ketolides  Medications: Prior to Admission medications   Medication Sig Start Date End Date Taking? Authorizing Provider  bisoprolol (ZEBETA) 5 MG tablet Take 5 mg by mouth daily.   Yes Historical Provider, MD  buPROPion (WELLBUTRIN SR) 200 MG 12 hr tablet Take 200 mg by mouth 2 (two) times daily.   Yes Historical Provider, MD  DULoxetine (CYMBALTA) 60 MG capsule Take 60 mg by mouth 2 (two) times daily.   Yes Historical Provider, MD  furosemide (LASIX) 40 MG tablet Take 40 mg by mouth daily.   Yes Historical Provider, MD  Gabapentin, PHN, (GRALISE) 600 MG TABS Take 1 tablet by mouth at bedtime.    Yes Historical Provider, MD    levothyroxine (SYNTHROID, LEVOTHROID) 25 MCG tablet Take 25 mcg by mouth daily before breakfast.   Yes Historical Provider, MD  LORazepam (ATIVAN) 1 MG tablet Take 1 mg by mouth at bedtime.    Yes Historical Provider, MD  Multiple Vitamins-Minerals (CENTRUM SILVER ADULT 50+) TABS Take 1 tablet by mouth daily.   Yes Historical Provider, MD  Oxycodone HCl 10 MG TABS Take 10 mg by mouth every 4 (four) hours as needed (pain).   Yes Historical Provider, MD  pregabalin (LYRICA) 150 MG capsule Take 150 mg by mouth 2 (two) times daily.   Yes Historical Provider, MD  Simethicone (GAS-X ULTRA STRENGTH) 180 MG CAPS Take 180 mg by mouth at bedtime.   Yes Historical Provider, MD  Zinc 100 MG TABS Take 1 tablet by mouth every morning.   Yes Historical Provider, MD  zolpidem (AMBIEN) 5 MG tablet Take 5 mg by mouth at bedtime.   Yes Historical Provider, MD  ciprofloxacin (CIPRO) 500 MG tablet Take 1 tablet (500 mg total) by mouth 2 (two) times daily. 11/30/14   Annita Brod, MD     Family History  Problem Relation Age of Onset  . Arthritis Mother   . Thyroid disease Son   . Breast cancer Sister     History   Social History  . Marital Status: Widowed    Spouse  Name: N/A  . Number of Children: 1  . Years of Education: BA   Occupational History  . Retired    Social History Main Topics  . Smoking status: Former Smoker -- 1.00 packs/day    Types: Cigarettes    Quit date: 12/09/1973  . Smokeless tobacco: Never Used  . Alcohol Use: No     Comment: quit: 1982  . Drug Use: No  . Sexual Activity: Not Currently   Other Topics Concern  . None   Social History Narrative   Patient lives at home with son.   Caffeine Use: 1-2 12 oz sodas daily    Review of Systems: A 12 point ROS discussed and pertinent positives are indicated in the HPI above.  All other systems are negative.  Review of Systems  Vital Signs: BP 133/54 mmHg  Pulse 83  Temp(Src) 98 F (36.7 C) (Oral)  Resp 20  Ht _0   (1.575 m)  Wt 161 lb 9.6 oz (73.301 kg)  BMI 29.55 kg/m2  SpO2 95%  Physical Exam  Constitutional: She is oriented to person, place, and time. No distress.  HENT:  Head: Normocephalic and atraumatic.  Neck: No tracheal deviation present.  Cardiovascular: Normal rate and regular rhythm.  Exam reveals no gallop and no friction rub.   No murmur heard. Pulmonary/Chest: Effort normal and breath sounds normal. No respiratory distress. She has no wheezes. She has no rales.  Abdominal: Soft. Bowel sounds are normal.  Neurological: She is alert and oriented to person, place, and time.  Skin: She is not diaphoretic.  Psychiatric: She has a normal mood and affect. Her behavior is normal. Thought content normal.    Mallampati Score:  MD Evaluation Airway: WNL Heart: WNL Abdomen: WNL Chest/ Lungs: WNL ASA  Classification: 2 Mallampati/Airway Score: Two  Imaging: Dg Chest 2 View  02/09/2015   CLINICAL DATA:  Patient fell earlier today.  History of lymphoma  EXAM: CHEST  2 VIEW  COMPARISON:  November 29, 2014  FINDINGS: There is no edema or consolidation. Heart is upper normal in size with pulmonary vascularity within normal limits. No adenopathy. There is atherosclerotic change in the aorta. No bone lesions. No pneumothorax.  IMPRESSION: No edema or consolidation.  No adenopathy.   Electronically Signed   By: Lowella Grip III M.D.   On: 02/09/2015 11:30   Ct Head Wo Contrast  02/09/2015   CLINICAL DATA:  Multiple falls. Patient with lethargy and difficulty with word-finding. Weakness.  EXAM: CT HEAD WITHOUT CONTRAST  TECHNIQUE: Contiguous axial images were obtained from the base of the skull through the vertex without intravenous contrast.  COMPARISON:  Brain MRI, 11/30/2014.  Head CT, 11/29/2014.  FINDINGS: Ventricles are normal in configuration. There is ventricular and sulcal enlargement reflecting mild diffuse atrophy. No hydrocephalus.  No parenchymal masses or mass effect. Patchy areas  of white matter hypoattenuation noted consistent with moderate chronic microvascular ischemic change.  No evidence of a cortical infarct.  There are no extra-axial masses or abnormal fluid collections.  There is no intracranial hemorrhage.  Visualized sinuses and mastoid air cells are clear.  No change from the prior studies.  IMPRESSION: 1. No acute intracranial abnormalities. 2. Mild atrophy and moderate chronic microvascular ischemic change, stable from the recent prior exams.   Electronically Signed   By: Lajean Manes M.D.   On: 02/09/2015 16:16   US Renal  02/10/2015   CLINICAL DATA:  Acute renal injury, hypertension  EXAM: RENAL/URINARY TRACT ULTRASOUND COMPLETE  COMPARISON:  None.  FINDINGS: Right Kidney:  Length: 10.5 cm. Echogenicity within normal limits. No mass or hydronephrosis visualized.  Left Kidney:  Length: 11.6 cm. Echogenicity within normal limits. No mass or hydronephrosis visualized.  Bladder:  Appears normal for degree of bladder distention.  The spleen is enlarged in size measuring 14 cm in greatest length. Splenic volume is calculated at 676 mL.  IMPRESSION: The kidneys are within normal limits bilaterally.  Changes consistent with splenomegaly.   Electronically Signed   By: Inez Catalina M.D.   On: 02/10/2015 11:59   Dg Hip Unilat With Pelvis 2-3 Views Right  02/09/2015   CLINICAL DATA:  Pt fell today at home in her bedroom. Pt c/o right hip pain. No known prior injury to hip. Per EMS, Pt tripped while getting out of bed and c/o being weaker then normal last few days. Denies neck and back pain. Sts she feels like she has a UTI.  EXAM: RIGHT HIP (WITH PELVIS) 2-3 VIEWS  COMPARISON:  None.  FINDINGS: No fracture or dislocation. Hip joints are normally spaced and aligned. There are no significant arthropathic changes.  Bony pelvis is intact.  Bones are diffusely demineralized.  Soft tissues are unremarkable.  IMPRESSION: No fracture or acute finding.   Electronically Signed   By: Lajean Manes M.D.   On: 02/09/2015 16:36    Labs:  CBC:  Recent Labs  11/29/14 2330 02/09/15 1038 02/10/15 0407 02/10/15 0916 02/10/15 1730  WBC 5.3 2.3* 3.0*  --  2.9*  HGB 11.8* 11.9* 11.1*  --  11.1*  HCT 37.3 37.8 35.1*  --  35.4*  PLT 106* 43* 27* PLATELET CLUMPS NOTED ON SMEAR, UNABLE TO ESTIMATE 33*    COAGS:  Recent Labs  11/29/14 2010 02/09/15 1635 02/10/15 0916  INR 0.99 1.01 1.11  APTT _0 BMP:  Recent Labs  11/29/14 2010 11/29/14 2330 02/09/15 1038 02/10/15 0407 02/11/15 0944  NA 140  --  139 140 143  K 2.3*  --  4.2 3.5 3.3*  CL 99  --  108 108 114*  CO2 31  --  _1 GLUCOSE 91  --  95 94 142*  BUN 11  --  24* 25* 21  CALCIUM 8.8  --  7.9* 7.9* 7.7*  CREATININE 0.75 0.79 1.05 1.08 0.91  GFRNONAA 76* 74* 47* 46* 56*  GFRAA 88* 86* 55* 53* 65*    LIVER FUNCTION TESTS:  Recent Labs  11/29/14 2010 02/09/15 1038 02/10/15 0407  BILITOT 1.0 0.8 1.0  AST _2 ALT _3 ALKPHOS 93 107 85  PROT 5.3* 4.7* 4.5*  ALBUMIN 3.4* 2.8* 2.7*    Assessment and Plan: S/p fall with right hip pain- no fracture seen CT head no acute findings  Pancytopenia, history of lymphoma s/p chemotherapy many years ago Request for image guided bone marrow biopsy with moderate sedation Patient will be npo after midnight for 3/7, labs ordered Risks and Benefits discussed with the patient including, but not limited to bleeding, infection, damage to adjacent structures or low yield requiring additional tests. All of the patient's questions were answered, patient is agreeable to proceed. Consent signed and in chart. OSA, uses CPAP. Cellulitis right leg on antibiotics  Thank you for this interesting consult.  I greatly enjoyed meeting EDYN POPOCA and look forward to participating in their care.  SignedHedy Jacob 02/11/2015, 1:26 PM   I spent  a total of 20 Minutes in face to face in clinical consultation, greater than 50% of which  was counseling/coordinating care for pancytopenia.

## 2015-02-11 NOTE — Progress Notes (Signed)
[facility] PROGRESS NOTE  Assessment/Plan: Thrombocytopenia/  Pancytopenia: - Her platelets were low since last time she was in the hospital, it was 106 back and now is 27, her B-12 is low she has an elevated RDW with a low MCV. Which could signify a mixed iron deficiency anemia and B-12 or folate deficiency. - Peripheral smear shows no schistocytes and DIC panel a d-dimer 7.2 with a fibrinogen of 310. - Anemia panel pending. ANA pending. - There is more than a 50% decrease in his platelets, greater than 10 days since she has been exposed to heparin, no necrosis - No signs of bleeding sites petechiae  SIRS/  Cellulitis, leg: - Her blood pressure improved after boluses of normal saline and ongoing normal saline infusion. - SHe is being covered broadly with vancomycin and Zosyn. He did have a mild left shift on admission, but no fever. - Her UA is unimpressive for UTI. Chest x-ray shows no infiltrate. Her calcitonin is less than 0.5 - Her lower extremity; petechiae rash which extends to her back and is also on her left lower extremity. I will go ahead and hold antibiotics. Start on [employer].  Right hip pain: - No Fractures. Consult PT/OT.  AKI (acute kidney injury): - Unclear etiology urinary sodium and urinary creatinine were not sent on admission she was started on IV fluids. Her specific gravity was 1020. - We'll KVO IV fluids this seems to be her baseline.  Essential hypertension: - Hold Lasix continue the Bisoprolol.  Hypothyroidism: - TSH within normal.  Acute encephalopathy: - It is now resolved.  - She could've been having delirium. She is able to carry on a conversation and be truthful about missing her potassium pills as the pills are so big.  Code Status: full Family Communication:none Disposition Plan: inpatient   Consultants:  none  Procedures:  Ct head  Antibiotics:  Vancomycin and Zosyn started on 03/08/2015-3.4.2016.  HPI/Subjective: No  complains except for she doesn't like the food.  Objective: Filed Vitals:   02/10/15 1833 02/10/15 2143 02/11/15 0254 02/11/15 0537  BP: 117/43 118/50 107/88 133/54  Pulse:  73 78 83  Temp:  97.8 F (36.6 C) 98.2 F (36.8 C) 98 F (36.7 C)  TempSrc:  Axillary Axillary Oral  Resp:  18 16 20   Height:      Weight:      SpO2:  98% 100% 95%    Intake/Output Summary (Last 24 hours) at 02/11/15 0855 Last data filed at 02/10/15 1900  Gross per 24 hour  Intake  912.5 ml  Output      0 ml  Net  912.5 ml   Filed Weights   02/09/15 1405 02/10/15 0418  Weight: 72.666 kg (160 lb 3.2 oz) 73.301 kg (161 lb 9.6 oz)    Exam:  General: Alert, awake, oriented x3, in no acute distress.  HEENT: No bruits, no goiter.  Heart: Regular rate and rhythm. Lungs: Good air movement, clear Abdomen: Soft, nontender, nondistended, positive bowel sounds.  Neuro: Grossly intact, nonfocal. Skin: Petechiae rash throughout her lower extremities and back.   Data Reviewed: Basic Metabolic Panel:  Recent Labs Lab 02/09/15 1038 02/10/15 0407  NA 139 140  K 4.2 3.5  CL 108 108  CO2 24 25  GLUCOSE 95 94  BUN 24* 25*  CREATININE 1.05 1.08  CALCIUM 7.9* 7.9*   Liver Function Tests:  Recent Labs Lab 02/09/15 1038 02/10/15 0407  AST 26 23  ALT 16 15  ALKPHOS 107  85  BILITOT 0.8 1.0  PROT 4.7* 4.5*  ALBUMIN 2.8* 2.7*    Recent Labs Lab 02/09/15 1038  LIPASE 21   No results for input(s): AMMONIA in the last 168 hours. CBC:  Recent Labs Lab 02/09/15 1038 02/10/15 0407 02/10/15 0916 02/10/15 1730  WBC 2.3* 3.0*  --  2.9*  NEUTROABS 1.2*  --   --   --   HGB 11.9* 11.1*  --  11.1*  HCT 37.8 35.1*  --  35.4*  MCV 89.6 88.4  --  88.1  PLT 43* 27* PLATELET CLUMPS NOTED ON SMEAR, UNABLE TO ESTIMATE 33*   Cardiac Enzymes: No results for input(s): CKTOTAL, CKMB, CKMBINDEX, TROPONINI in the last 168 hours. BNP (last 3 results) No results for input(s): BNP in the last 8760  hours.  ProBNP (last 3 results) No results for input(s): PROBNP in the last 8760 hours.  CBG: No results for input(s): GLUCAP in the last 168 hours.  Recent Results (from the past 240 hour(s))  Blood culture (routine x 2)     Status: None (Preliminary result)   Collection Time: 02/09/15 10:38 AM  Result Value Ref Range Status   Specimen Description BLOOD LEFT ANTECUBITAL  Final   Special Requests BOTTLES DRAWN AEROBIC AND ANAEROBIC 5ML  Final   Culture   Final           BLOOD CULTURE RECEIVED NO GROWTH TO DATE CULTURE WILL BE HELD FOR 5 DAYS BEFORE ISSUING A FINAL NEGATIVE REPORT Performed at [facility]    Report Status PENDING  Incomplete  Blood culture (routine x 2)     Status: None (Preliminary result)   Collection Time: 02/09/15 10:38 AM  Result Value Ref Range Status   Specimen Description BLOOD RIGHT ANTECUBITAL  Final   Special Requests BOTTLES DRAWN AEROBIC AND ANAEROBIC 4ML  Final   Culture   Final           BLOOD CULTURE RECEIVED NO GROWTH TO DATE CULTURE WILL BE HELD FOR 5 DAYS BEFORE ISSUING A FINAL NEGATIVE REPORT Performed at [facility]    Report Status PENDING  Incomplete  Urine culture     Status: None (Preliminary result)   Collection Time: 02/09/15 11:35 AM  Result Value Ref Range Status   Specimen Description URINE, CLEAN CATCH  Final   Special Requests NONE  Final   Colony Count   Final    >=100,000 COLONIES/ML Performed at [facility]    Culture   Final    ENTEROCOCCUS SPECIES Performed at [facility]    Report Status PENDING  Incomplete     Studies: Dg Chest 2 View  02/09/2015   CLINICAL DATA:  Patient fell earlier today.  History of lymphoma  EXAM: CHEST  2 VIEW  COMPARISON:  November 29, 2014  FINDINGS: There is no edema or consolidation. Heart is upper normal in size with pulmonary vascularity within normal limits. No adenopathy. There is atherosclerotic change in the aorta. No bone lesions. No pneumothorax.   IMPRESSION: No edema or consolidation.  No adenopathy.   Electronically Signed   By: Lowella Grip III M.D.   On: 02/09/2015 11:30   Ct Head Wo Contrast  02/09/2015   CLINICAL DATA:  Multiple falls. Patient with lethargy and difficulty with word-finding. Weakness.  EXAM: CT HEAD WITHOUT CONTRAST  TECHNIQUE: Contiguous axial images were obtained from the base of the skull through the vertex without intravenous contrast.  COMPARISON:  Brain MRI, 11/30/2014.  Head CT, 11/29/2014.  FINDINGS: Ventricles are normal in configuration. There is ventricular and sulcal enlargement reflecting mild diffuse atrophy. No hydrocephalus.  No parenchymal masses or mass effect. Patchy areas of white matter hypoattenuation noted consistent with moderate chronic microvascular ischemic change.  No evidence of a cortical infarct.  There are no extra-axial masses or abnormal fluid collections.  There is no intracranial hemorrhage.  Visualized sinuses and mastoid air cells are clear.  No change from the prior studies.  IMPRESSION: 1. No acute intracranial abnormalities. 2. Mild atrophy and moderate chronic microvascular ischemic change, stable from the recent prior exams.   Electronically Signed   By: Lajean Manes M.D.   On: 02/09/2015 16:16   US Renal  02/10/2015   CLINICAL DATA:  Acute renal injury, hypertension  EXAM: RENAL/URINARY TRACT ULTRASOUND COMPLETE  COMPARISON:  None.  FINDINGS: Right Kidney:  Length: 10.5 cm. Echogenicity within normal limits. No mass or hydronephrosis visualized.  Left Kidney:  Length: 11.6 cm. Echogenicity within normal limits. No mass or hydronephrosis visualized.  Bladder:  Appears normal for degree of bladder distention.  The spleen is enlarged in size measuring 14 cm in greatest length. Splenic volume is calculated at 676 mL.  IMPRESSION: The kidneys are within normal limits bilaterally.  Changes consistent with splenomegaly.   Electronically Signed   By: Inez Catalina M.D.   On: 02/10/2015 11:59     Dg Hip Unilat With Pelvis 2-3 Views Right  02/09/2015   CLINICAL DATA:  Pt fell today at home in her bedroom. Pt c/o right hip pain. No known prior injury to hip. Per EMS, Pt tripped while getting out of bed and c/o being weaker then normal last few days. Denies neck and back pain. Sts she feels like she has a UTI.  EXAM: RIGHT HIP (WITH PELVIS) 2-3 VIEWS  COMPARISON:  None.  FINDINGS: No fracture or dislocation. Hip joints are normally spaced and aligned. There are no significant arthropathic changes.  Bony pelvis is intact.  Bones are diffusely demineralized.  Soft tissues are unremarkable.  IMPRESSION: No fracture or acute finding.   Electronically Signed   By: Lajean Manes M.D.   On: 02/09/2015 16:36    Scheduled Meds: . buPROPion  200 mg Oral BID  . cyanocobalamin  1,000 mcg Intramuscular Daily  . levothyroxine  25 mcg Oral QAC breakfast  . LORazepam  1 mg Oral QHS  . multivitamin with minerals  1 tablet Oral Daily  . pregabalin  150 mg Oral BID  . saccharomyces boulardii  250 mg Oral BID  . sodium chloride  3 mL Intravenous Q12H   Continuous Infusions: . sodium chloride 75 mL/hr at 02/09/15 2303  . sodium chloride 75 mL/hr at 02/10/15 2231     Charlynne Cousins  [facility] Pager [phone]. If 7PM-7AM, please contact night-coverage at [email or web address], password [facility] 02/11/2015, 8:55 AM  LOS: 2 days

## 2015-02-12 DIAGNOSIS — Z9221 Personal history of antineoplastic chemotherapy: Secondary | ICD-10-CM

## 2015-02-12 DIAGNOSIS — E039 Hypothyroidism, unspecified: Secondary | ICD-10-CM

## 2015-02-12 DIAGNOSIS — I10 Essential (primary) hypertension: Secondary | ICD-10-CM

## 2015-02-12 DIAGNOSIS — E611 Iron deficiency: Secondary | ICD-10-CM

## 2015-02-12 DIAGNOSIS — R531 Weakness: Secondary | ICD-10-CM

## 2015-02-12 DIAGNOSIS — C8590 Non-Hodgkin lymphoma, unspecified, unspecified site: Secondary | ICD-10-CM

## 2015-02-12 NOTE — Progress Notes (Signed)
[facility] PROGRESS NOTE  Assessment/Plan: Thrombocytopenia/  Pancytopenia: - Concerned about leukemia/lymphoma due to her history. - Consulted hematology I recommended a bone marrow biopsy.  Petechiae/Rash - Her lower extremity; petechiae rash which extends to her back and is also on her left lower extremity. I will go ahead and hold antibiotics. Start on [employer].  Right hip pain: - No Fractures. Consult PT/OT.  AKI (acute kidney injury): - Resolved with hydration. - We'll KVO IV fluids this seems to be her baseline.  Essential hypertension: - Hold Lasix continue the Bisoprolol.  Hypothyroidism: - TSH within normal.  Acute encephalopathy: - It is now resolved.  - OSA: - cont c-pap at home.  Code Status: full Family Communication:none Disposition Plan: inpatient   Consultants:  none  Procedures:  Ct head  Antibiotics:  Vancomycin and Zosyn started on 03/08/2015-3.4.2016.  HPI/Subjective: No complains.  Objective: Filed Vitals:   02/11/15 0537 02/11/15 2134 02/12/15 0107 02/12/15 0536  BP: 133/54 121/54 139/55 158/77  Pulse: 83 84 82 77  Temp: 98 F (36.7 C) 97.7 F (36.5 C) 97.6 F (36.4 C) 98.2 F (36.8 C)  TempSrc: Oral Oral Oral Oral  Resp: 20 20 20 18   Height:      Weight:    72.303 kg (159 lb 6.4 oz)  SpO2: 95% 98% 98% 100%    Intake/Output Summary (Last 24 hours) at 02/12/15 2500 Last data filed at 02/11/15 1300  Gross per 24 hour  Intake   2280 ml  Output      0 ml  Net   2280 ml   Filed Weights   02/09/15 1405 02/10/15 0418 02/12/15 0536  Weight: 72.666 kg (160 lb 3.2 oz) 73.301 kg (161 lb 9.6 oz) 72.303 kg (159 lb 6.4 oz)    Exam:  General: Alert, awake, oriented x3, in no acute distress.  HEENT: No bruits, no goiter.  Heart: Regular rate and rhythm. Lungs: Good air movement, clear Abdomen: Soft, nontender, nondistended, positive bowel sounds.  Neuro: Grossly intact, nonfocal. Skin: Petechiae rash throughout  her lower extremities and back.   Data Reviewed: Basic Metabolic Panel:  Recent Labs Lab 02/09/15 1038 02/10/15 0407 02/11/15 0944  NA 139 140 143  K 4.2 3.5 3.3*  CL 108 108 114*  CO2 24 25 25   GLUCOSE 95 94 142*  BUN 24* 25* 21  CREATININE 1.05 1.08 0.91  CALCIUM 7.9* 7.9* 7.7*   Liver Function Tests:  Recent Labs Lab 02/09/15 1038 02/10/15 0407  AST 26 23  ALT 16 15  ALKPHOS 107 85  BILITOT 0.8 1.0  PROT 4.7* 4.5*  ALBUMIN 2.8* 2.7*    Recent Labs Lab 02/09/15 1038  LIPASE 21   No results for input(s): AMMONIA in the last 168 hours. CBC:  Recent Labs Lab 02/09/15 1038 02/10/15 0407 02/10/15 0916 02/10/15 1730  WBC 2.3* 3.0*  --  2.9*  NEUTROABS 1.2*  --   --   --   HGB 11.9* 11.1*  --  11.1*  HCT 37.8 35.1*  --  35.4*  MCV 89.6 88.4  --  88.1  PLT 43* 27* PLATELET CLUMPS NOTED ON SMEAR, UNABLE TO ESTIMATE 33*   Cardiac Enzymes: No results for input(s): CKTOTAL, CKMB, CKMBINDEX, TROPONINI in the last 168 hours. BNP (last 3 results) No results for input(s): BNP in the last 8760 hours.  ProBNP (last 3 results) No results for input(s): PROBNP in the last 8760 hours.  CBG: No results for input(s): GLUCAP in the last  168 hours.  Recent Results (from the past 240 hour(s))  Blood culture (routine x 2)     Status: None (Preliminary result)   Collection Time: 02/09/15 10:38 AM  Result Value Ref Range Status   Specimen Description BLOOD LEFT ANTECUBITAL  Final   Special Requests BOTTLES DRAWN AEROBIC AND ANAEROBIC 5ML  Final   Culture   Final           BLOOD CULTURE RECEIVED NO GROWTH TO DATE CULTURE WILL BE HELD FOR 5 DAYS BEFORE ISSUING A FINAL NEGATIVE REPORT Performed at [facility]    Report Status PENDING  Incomplete  Blood culture (routine x 2)     Status: None (Preliminary result)   Collection Time: 02/09/15 10:38 AM  Result Value Ref Range Status   Specimen Description BLOOD RIGHT ANTECUBITAL  Final   Special Requests BOTTLES  DRAWN AEROBIC AND ANAEROBIC 4ML  Final   Culture   Final           BLOOD CULTURE RECEIVED NO GROWTH TO DATE CULTURE WILL BE HELD FOR 5 DAYS BEFORE ISSUING A FINAL NEGATIVE REPORT Performed at [facility]    Report Status PENDING  Incomplete  Urine culture     Status: None   Collection Time: 02/09/15 11:35 AM  Result Value Ref Range Status   Specimen Description URINE, CLEAN CATCH  Final   Special Requests NONE  Final   Colony Count   Final    >=100,000 COLONIES/ML Performed at [facility]    Culture   Final    ENTEROCOCCUS SPECIES Performed at [facility]    Report Status 02/11/2015 FINAL  Final   Organism ID, Bacteria ENTEROCOCCUS SPECIES  Final      Susceptibility   Enterococcus species - MIC*    AMPICILLIN <=2 SENSITIVE Sensitive     LEVOFLOXACIN 1 SENSITIVE Sensitive     NITROFURANTOIN <=16 SENSITIVE Sensitive     VANCOMYCIN 2 SENSITIVE Sensitive     TETRACYCLINE >=16 RESISTANT Resistant     * ENTEROCOCCUS SPECIES  Clostridium Difficile by PCR     Status: None   Collection Time: 02/11/15  6:50 PM  Result Value Ref Range Status   C difficile by pcr NEGATIVE NEGATIVE Final     Studies: US Renal  02/10/2015   CLINICAL DATA:  Acute renal injury, hypertension  EXAM: RENAL/URINARY TRACT ULTRASOUND COMPLETE  COMPARISON:  None.  FINDINGS: Right Kidney:  Length: 10.5 cm. Echogenicity within normal limits. No mass or hydronephrosis visualized.  Left Kidney:  Length: 11.6 cm. Echogenicity within normal limits. No mass or hydronephrosis visualized.  Bladder:  Appears normal for degree of bladder distention.  The spleen is enlarged in size measuring 14 cm in greatest length. Splenic volume is calculated at 676 mL.  IMPRESSION: The kidneys are within normal limits bilaterally.  Changes consistent with splenomegaly.   Electronically Signed   By: Inez Catalina M.D.   On: 02/10/2015 11:59    Scheduled Meds: . buPROPion  200 mg Oral BID  . cyanocobalamin  1,000  mcg Intramuscular Daily  . [START ON 02/13/2015] levofloxacin  750 mg Oral Q48H  . levothyroxine  25 mcg Oral QAC breakfast  . LORazepam  1 mg Oral QHS  . multivitamin with minerals  1 tablet Oral Daily  . pantoprazole  40 mg Oral Daily  . pregabalin  150 mg Oral BID  . saccharomyces boulardii  250 mg Oral BID  . sodium chloride  3 mL Intravenous  Q12H   Continuous Infusions:     Charlynne Cousins  [facility] Pager [phone]. If 7PM-7AM, please contact night-coverage at [email or web address], password [facility] 02/12/2015, 8:22 AM  LOS: 3 days

## 2015-02-12 NOTE — Consult Note (Signed)
Referral MD  Reason for Referral: Thrombocytopenia   Chief Complaint  Patient presents with  . Fall  . Weakness  : I'm here because I have gotten weak.  HPI: Vicki Bauer is a very charming 81 year old white female. She is originally from [place]. We had a good time talking about that.  Of great interest is that she had lymphoma. She apparently had this about 20 years ago. She had chemotherapy for this. They came back 5 years later. She had more chemotherapy. Apparently, she has been in remission.  When she came in, she was found to have pancytopenia but mostly thrombocytopenia. Her white cell count was 2.3. Hemoglobin 11.9. Platelet count was 43. She had a relatively normal white cell differential. She had a normal MCV.  She had a workup. Her iron was very low. Her folate was 18.6. LDH was 162. She had a relatively normal BUN and creatinine. Her albumin was only 2.7.  She had a reticulocyte count that was corrected that was about 2%.  Her vitamin B-12 was 324.  She's not a vegetarian. She has not lost weight when she says.  She did have an ultrasound done on March 4. This did show some splenomegaly.  She's not had any obvious fever. She was found to have enterococcus in her urine.  Back in December, when she was also admitted, she was found to have Escherichia coli.  Back in December, her lab work showed a pleasant count to be 100,000. White count 5 and hemoglobin 12.5. Her MCV was normal.     We were kindly asked to see her to try to help with the evaluation.     Past Medical History  Diagnosis Date  . Hypertension   . Hypothyroidism   . Macular degeneration   . Cancer     lymphoma  . Insomnia   . OAB (overactive bladder)   . CAD (coronary artery disease) 1990    s/p stent x2,  :  Past Surgical History  Procedure Laterality Date  . Tonsillectomy and adenoidectomy    . Hysterotomy    . Replacement total knee bilateral    . Lymph gland excision Bilateral   .  Cholecystectomy    . Cataract extraction Bilateral   . Appendectomy    . Ptca    . Back surgery    :   Current facility-administered medications:  .  acetaminophen (TYLENOL) tablet 650 mg, 650 mg, Oral, Q6H PRN, 650 mg at 02/11/15 2009 **OR** acetaminophen (TYLENOL) suppository 650 mg, 650 mg, Rectal, Q6H PRN, Orson Eva, MD .  alum & mag hydroxide-simeth (MAALOX/MYLANTA) 200-200-20 MG/5ML suspension 30 mL, 30 mL, Oral, Q4H PRN, Charlynne Cousins, MD, 30 mL at 02/11/15 1536 .  buPROPion [facility] [place] SR) 12 hr tablet 200 mg, 200 mg, Oral, BID, Orson Eva, MD, 200 mg at 02/11/15 2009 .  cyanocobalamin ((VITAMIN B-12)) injection 1,000 mcg, 1,000 mcg, Intramuscular, Daily, Charlynne Cousins, MD, 1,000 mcg at 02/11/15 1049 .  [START ON 02/13/2015] levofloxacin (LEVAQUIN) tablet 750 mg, 750 mg, Oral, Q48H, Charlynne Cousins, MD .  levothyroxine (SYNTHROID, LEVOTHROID) tablet 25 mcg, 25 mcg, Oral, QAC breakfast, Orson Eva, MD, 25 mcg at 02/12/15 0805 .  LORazepam (ATIVAN) tablet 1 mg, 1 mg, Oral, QHS, Orson Eva, MD, 1 mg at 02/11/15 2009 .  multivitamin with minerals tablet 1 tablet, 1 tablet, Oral, Daily, Orson Eva, MD, 1 tablet at 02/11/15 1049 .  ondansetron (ZOFRAN) tablet 4 mg, 4 mg, Oral, Q6H PRN **OR**  ondansetron (ZOFRAN) injection 4 mg, 4 mg, Intravenous, Q6H PRN, Orson Eva, MD .  pantoprazole (PROTONIX) EC tablet 40 mg, 40 mg, Oral, Daily, Charlynne Cousins, MD, 40 mg at 02/11/15 1641 .  pregabalin (LYRICA) capsule 150 mg, 150 mg, Oral, BID, Orson Eva, MD, 150 mg at 02/11/15 2009 .  saccharomyces boulardii (FLORASTOR) capsule 250 mg, 250 mg, Oral, BID, Charlynne Cousins, MD, 250 mg at 02/11/15 2009 .  sodium chloride 0.9 % injection 3 mL, 3 mL, Intravenous, Q12H, Orson Eva, MD, 3 mL at 02/11/15 2014:  . buPROPion  200 mg Oral BID  . cyanocobalamin  1,000 mcg Intramuscular Daily  . [START ON 02/13/2015] levofloxacin  750 mg Oral Q48H  . levothyroxine  25 mcg Oral QAC breakfast  .  LORazepam  1 mg Oral QHS  . multivitamin with minerals  1 tablet Oral Daily  . pantoprazole  40 mg Oral Daily  . pregabalin  150 mg Oral BID  . saccharomyces boulardii  250 mg Oral BID  . sodium chloride  3 mL Intravenous Q12H  :  Allergies  Allergen Reactions  . Demerol [Meperidine] Other (See Comments)    Turned patient blue and couldn't breathe  . Morphine And Related Other (See Comments)    Turned patient blue and couldn't breathe   . Macrolides And Ketolides Nausea Only  :  Family History  Problem Relation Age of Onset  . Arthritis Mother   . Thyroid disease Son   . Breast cancer Sister   :  History   Social History  . Marital Status: Widowed    Spouse Name: N/A  . Number of Children: 1  . Years of Education: BA   Occupational History  . Retired    Social History Main Topics  . Smoking status: Former Smoker -- 1.00 packs/day    Types: Cigarettes    Quit date: 12/09/1973  . Smokeless tobacco: Never Used  . Alcohol Use: No     Comment: quit: 1982  . Drug Use: No  . Sexual Activity: Not Currently   Other Topics Concern  . Not on file   Social History Narrative   Patient lives at home with son.   Caffeine Use: 1-2 12 oz sodas daily  :  Pertinent items are noted in HPI.  Exam: Patient Vitals for the past 24 hrs:  BP Temp Temp src Pulse Resp SpO2 Weight  02/12/15 0536 (!) 158/77 mmHg 98.2 F (36.8 C) Oral 77 18 100 % 159 lb 6.4 oz (72.303 kg)  02/12/15 0107 (!) 139/55 mmHg 97.6 F (36.4 C) Oral 82 20 98 % -  02/11/15 2134 (!) 121/54 mmHg 97.7 F (36.5 C) Oral 84 20 98 % -   elderly white female in no obvious distress. She was alert and oriented 3. Vital signs are as above. Head and neck exam shows no ocular or oral lesions. She has no adenopathy in the neck. Lungs are clear. Cardiac exam regular rate and rhythm. Abdomen is soft. She has good bowel sounds. There is no palpable hepatomegaly. Her spleen tip might be palpable just below the left costal  margin. Extremities shows some stasis dermatitis type changes in her lower legs. She has some chronic edema in her legs. Skin exam shows some scattered ecchymoses. Neurological exam is non-focal.    Recent Labs  02/10/15 0407 02/10/15 0916 02/10/15 1730  WBC 3.0*  --  2.9*  HGB 11.1*  --  11.1*  HCT 35.1*  --  35.4*  PLT 27* PLATELET CLUMPS NOTED ON SMEAR, UNABLE TO ESTIMATE 33*    Recent Labs  02/10/15 0407 02/11/15 0944  NA 140 143  K 3.5 3.3*  CL 108 114*  CO2 25 25  GLUCOSE 94 142*  BUN 25* 21  CREATININE 1.08 0.91  CALCIUM 7.9* 7.7*    Blood smear review: Normochromic and normocytic red blood cells. There is no nucleated red cells. There may be a few teardrop cells. White cells been normal in morphology maturation. There is no immature myeloid cells. There are no hypersegmented polys. Platelets are decreased in number. There may be a couple large platelets. Platelets are well granulated.  Patthology: None     Assessment and Plan: Vicki Bauer is a very charming 81 year old white female. She has pancytopenia but mostly marked thrombocytopenia.  I find it very interesting that she had chemotherapy about 15 years ago. As such, I wonder if she does not have myelodysplasia now. I also wonder she does not have recurrent lymphoma. She has the large spleen. I don't see any evidence of cirrhosis. She may need to have a CT scan to look at her liver. In addition, she may need to have a CT scan to look for lymphadenopathy.  I think the key is going to be a bone marrow biopsy. I don't see any other way around this. If she has myelo dysplasia, then a bone marrow will show [place] this. I think that she has lymphoma, a bone marrow will show [place] this.  She's very nice. It was nice talking to her.  We will certainly follow along.  I appreciate the opportunity to have seen her.   Pete E.

## 2015-02-13 ENCOUNTER — Encounter (HOSPITAL_COMMUNITY): Payer: Self-pay | Admitting: Radiology

## 2015-02-13 ENCOUNTER — Inpatient Hospital Stay (HOSPITAL_COMMUNITY): Payer: MEDICARE

## 2015-02-13 LAB — CBC
HEMATOCRIT: 34 % — AB (ref 36.0–46.0)
HEMOGLOBIN: 10.8 g/dL — AB (ref 12.0–15.0)
MCH: 27.3 pg (ref 26.0–34.0)
MCHC: 31.8 g/dL (ref 30.0–36.0)
MCV: 86.1 fL (ref 78.0–100.0)
Platelets: 36 10*3/uL — ABNORMAL LOW (ref 150–400)
RBC: 3.95 MIL/uL (ref 3.87–5.11)
RDW: 15.8 % — ABNORMAL HIGH (ref 11.5–15.5)
WBC: 3.1 10*3/uL — ABNORMAL LOW (ref 4.0–10.5)

## 2015-02-13 LAB — BONE MARROW EXAM: Bone Marrow Exam: 160

## 2015-02-13 LAB — ERYTHROPOIETIN: ERYTHROPOIETIN: 19.5 m[IU]/mL — AB (ref 2.6–18.5)

## 2015-02-13 LAB — FOLATE RBC: HEMATOCRIT: 40.9 % (ref 34.0–46.6)

## 2015-02-13 MED ORDER — FENTANYL CITRATE 0.05 MG/ML IJ SOLN
INTRAMUSCULAR | Status: AC | PRN
  Administered 2015-02-13: 25 ug via INTRAVENOUS

## 2015-02-13 MED ORDER — SIMETHICONE 80 MG PO CHEW
80.0000 mg | CHEWABLE_TABLET | Freq: Four times a day (QID) | ORAL | Status: DC | PRN
  Administered 2015-02-13: 80 mg via ORAL
  Filled 2015-02-13 (×2): qty 1

## 2015-02-13 MED ORDER — MIDAZOLAM HCL 2 MG/2ML IJ SOLN
INTRAMUSCULAR | Status: AC
  Filled 2015-02-13: qty 6

## 2015-02-13 MED ORDER — MIDAZOLAM HCL 2 MG/2ML IJ SOLN
INTRAMUSCULAR | Status: AC | PRN
  Administered 2015-02-13: 0.5 mg via INTRAVENOUS

## 2015-02-13 MED ORDER — FUROSEMIDE 40 MG PO TABS
40.0000 mg | ORAL_TABLET | Freq: Every day | ORAL | Status: DC
  Administered 2015-02-13 – 2015-02-16 (×4): 40 mg via ORAL
  Filled 2015-02-13 (×4): qty 1

## 2015-02-13 MED ORDER — FENTANYL CITRATE 0.05 MG/ML IJ SOLN
INTRAMUSCULAR | Status: AC
  Filled 2015-02-13: qty 4

## 2015-02-13 NOTE — Progress Notes (Signed)
[facility] PROGRESS NOTE  Assessment/Plan: Thrombocytopenia/  Pancytopenia: - Concerned about leukemia/lymphoma due to her history. - Consulted hematology  recommended a bone marrow biopsy on 3.7.2016  Petechiae/Rash - Her lower extremity; petechiae rash which extends to her back and is also on her left lower extremity. I will go ahead and hold antibiotics. Start on [employer].  Right hip pain: - No Fractures. Consult PT/OT.  AKI (acute kidney injury): - Resolved with hydration.  Essential hypertension: - Resume Lasix continue the Bisoprolol.  Hypothyroidism: - TSH within normal.  Acute encephalopathy: - It is now resolved.  - OSA: - cont c-pap at home.  Code Status: full Family Communication:none Disposition Plan: inpatient   Consultants:  none  Procedures:  Ct head  Antibiotics:  Vancomycin and Zosyn started on 03/08/2015-3.4.2016.  HPI/Subjective: Complaining of gas  Objective: Filed Vitals:   02/13/15 0436 02/13/15 0440 02/13/15 0441 02/13/15 0625  BP:  172/74 156/74 154/75  Pulse:  86    Temp:  98.1 F (36.7 C)    TempSrc:  Oral    Resp:      Height:      Weight: 75.025 kg (165 lb 6.4 oz)     SpO2:  97%      Intake/Output Summary (Last 24 hours) at 02/13/15 0909 Last data filed at 02/12/15 1600  Gross per 24 hour  Intake    720 ml  Output      0 ml  Net    720 ml   Filed Weights   02/10/15 0418 02/12/15 0536 02/13/15 0436  Weight: 73.301 kg (161 lb 9.6 oz) 72.303 kg (159 lb 6.4 oz) 75.025 kg (165 lb 6.4 oz)    Exam:  General: Alert, awake, oriented x3, in no acute distress.  HEENT: No bruits, no goiter.  Heart: Regular rate and rhythm. Lungs: Good air movement, clear Abdomen: Soft, nontender, nondistended, positive bowel sounds.  Neuro: Grossly intact, nonfocal. Skin: Petechiae rash throughout her lower extremities and back.   Data Reviewed: Basic Metabolic Panel:  Recent Labs Lab 02/09/15 1038 02/10/15 0407  02/11/15 0944  NA 139 140 143  K 4.2 3.5 3.3*  CL 108 108 114*  CO2 _0 GLUCOSE 95 94 142*  BUN 24* 25* 21  CREATININE 1.05 1.08 0.91  CALCIUM 7.9* 7.9* 7.7*   Liver Function Tests:  Recent Labs Lab 02/09/15 1038 02/10/15 0407  AST 26 23  ALT 16 15  ALKPHOS 107 85  BILITOT 0.8 1.0  PROT 4.7* 4.5*  ALBUMIN 2.8* 2.7*    Recent Labs Lab 02/09/15 1038  LIPASE 21   No results for input(s): AMMONIA in the last 168 hours. CBC:  Recent Labs Lab 02/09/15 1038 02/10/15 0407 02/10/15 0916 02/10/15 1730 02/13/15 0541  WBC 2.3* 3.0*  --  2.9* 3.1*  NEUTROABS 1.2*  --   --   --   --   HGB 11.9* 11.1*  --  11.1* 10.8*  HCT 37.8 35.1*  --  35.4* 34.0*  MCV 89.6 88.4  --  88.1 86.1  PLT 43* 27* PLATELET CLUMPS NOTED ON SMEAR, UNABLE TO ESTIMATE 33* 36*   Cardiac Enzymes: No results for input(s): CKTOTAL, CKMB, CKMBINDEX, TROPONINI in the last 168 hours. BNP (last 3 results) No results for input(s): BNP in the last 8760 hours.  ProBNP (last 3 results) No results for input(s): PROBNP in the last 8760 hours.  CBG: No results for input(s): GLUCAP in the last 168 hours.  Recent Results (  from the past 240 hour(s))  Blood culture (routine x 2)     Status: None (Preliminary result)   Collection Time: 02/09/15 10:38 AM  Result Value Ref Range Status   Specimen Description BLOOD LEFT ANTECUBITAL  Final   Special Requests BOTTLES DRAWN AEROBIC AND ANAEROBIC 5ML  Final   Culture   Final           BLOOD CULTURE RECEIVED NO GROWTH TO DATE CULTURE WILL BE HELD FOR 5 DAYS BEFORE ISSUING A FINAL NEGATIVE REPORT Performed at [facility]    Report Status PENDING  Incomplete  Blood culture (routine x 2)     Status: None (Preliminary result)   Collection Time: 02/09/15 10:38 AM  Result Value Ref Range Status   Specimen Description BLOOD RIGHT ANTECUBITAL  Final   Special Requests BOTTLES DRAWN AEROBIC AND ANAEROBIC 4ML  Final   Culture   Final           BLOOD  CULTURE RECEIVED NO GROWTH TO DATE CULTURE WILL BE HELD FOR 5 DAYS BEFORE ISSUING A FINAL NEGATIVE REPORT Performed at [facility]    Report Status PENDING  Incomplete  Urine culture     Status: None   Collection Time: 02/09/15 11:35 AM  Result Value Ref Range Status   Specimen Description URINE, CLEAN CATCH  Final   Special Requests NONE  Final   Colony Count   Final    >=100,000 COLONIES/ML Performed at [facility]    Culture   Final    ENTEROCOCCUS SPECIES Performed at [facility]    Report Status 02/11/2015 FINAL  Final   Organism ID, Bacteria ENTEROCOCCUS SPECIES  Final      Susceptibility   Enterococcus species - MIC*    AMPICILLIN <=2 SENSITIVE Sensitive     LEVOFLOXACIN 1 SENSITIVE Sensitive     NITROFURANTOIN <=16 SENSITIVE Sensitive     VANCOMYCIN 2 SENSITIVE Sensitive     TETRACYCLINE >=16 RESISTANT Resistant     * ENTEROCOCCUS SPECIES  Clostridium Difficile by PCR     Status: None   Collection Time: 02/11/15  6:50 PM  Result Value Ref Range Status   C difficile by pcr NEGATIVE NEGATIVE Final     Studies: No results found.  Scheduled Meds: . buPROPion  200 mg Oral BID  . cyanocobalamin  1,000 mcg Intramuscular Daily  . levofloxacin  750 mg Oral Q48H  . levothyroxine  25 mcg Oral QAC breakfast  . LORazepam  1 mg Oral QHS  . multivitamin with minerals  1 tablet Oral Daily  . pantoprazole  40 mg Oral Daily  . pregabalin  150 mg Oral BID  . saccharomyces boulardii  250 mg Oral BID  . sodium chloride  3 mL Intravenous Q12H   Continuous Infusions:     Charlynne Cousins  [facility] Pager [phone]. If 7PM-7AM, please contact night-coverage at [email or web address], password [facility] 02/13/2015, 9:09 AM  LOS: 4 days

## 2015-02-13 NOTE — Progress Notes (Signed)
Physical Therapy Treatment Patient Details Name: Vicki Bauer MRN: [id number] DOB: August 26, 1928 Today's Date: 02/13/2015    History of Present Illness 81 yo female admitted with UTI, falls, LE cellulitis, sepsis. Hx of HTN, CAD    PT Comments    Pt highly motivated and eager to walk.  Assisted OOB to amb a greater distance in the hallway using RW.  Slow but for the most part steady with linear mvts and close minguard assist with turns and targeting.  One sitting rest break then returned to room amb.  Pleasant. Progressing well.   Follow Up Recommendations  Home health PT;Supervision/Assistance - 24 hour     Equipment Recommendations  None recommended by PT    Recommendations for Other Services       Precautions / Restrictions Precautions Precautions: Fall    Mobility  Bed Mobility Overal bed mobility: Needs Assistance Bed Mobility: Supine to Sit;Sit to Supine     Supine to sit: Min guard Sit to supine: Min guard   General bed mobility comments: increased time otherwise able to self manage  Transfers Overall transfer level: Needs assistance Equipment used: Rolling walker (2 wheeled) Transfers: Sit to/from Stand Sit to Stand: Min guard         General transfer comment: increased time as she is unable to use R UE "bad shoulder".  25% VC's on safety with turn completion prior to sit.   Ambulation/Gait Ambulation/Gait assistance: Min guard;Min assist Ambulation Distance (Feet): 100 Feet (50 feet x 2 one sitting rest break) Assistive device: Rolling walker (2 wheeled) Gait Pattern/deviations: Step-to pattern;Step-through pattern;Decreased stride length;Trunk flexed Gait velocity: decreased   General Gait Details: Assist to stabilize. Pt mobilized well with walker. slow   Stairs            Wheelchair Mobility    Modified Rankin (Stroke Patients Only)       Balance                                    Cognition Arousal/Alertness:  Awake/alert Behavior During Therapy: WFL for tasks assessed/performed Overall Cognitive Status: Within Functional Limits for tasks assessed                      Exercises      General Comments        Pertinent Vitals/Pain Pain Assessment: 0-10 Pain Score: 5  Pain Location: R shouler (chronic) Pain Descriptors / Indicators: Constant;Sore Pain Intervention(s): Monitored during session;Repositioned    Home Living                      Prior Function            PT Goals (current goals can now be found in the care plan section) Progress towards PT goals: Progressing toward goals    Frequency  Min 3X/week    PT Plan      Co-evaluation             End of Session Equipment Utilized During Treatment: Gait belt Activity Tolerance: Patient tolerated treatment well Patient left: in bed;with call bell/phone within reach     Time: 1445-1510 PT Time Calculation (min) (ACUTE ONLY): 25 min  Charges:  $Gait Training: 8-22 mins $Therapeutic Activity: 8-22 mins                    G Codes:  Rica Koyanagi  PTA [facility]  Acute  Rehab Pager      [phone]

## 2015-02-13 NOTE — Procedures (Signed)
Technically successful CT guided bone marrow aspiration and biopsy of left iliac crest. No immediate complications.   

## 2015-02-14 ENCOUNTER — Encounter (HOSPITAL_COMMUNITY): Payer: Self-pay | Admitting: Radiology

## 2015-02-14 ENCOUNTER — Inpatient Hospital Stay (HOSPITAL_COMMUNITY): Payer: MEDICARE

## 2015-02-14 LAB — CBC WITH DIFFERENTIAL/PLATELET
BASOS ABS: 0 10*3/uL (ref 0.0–0.1)
BASOS PCT: 0 % (ref 0–1)
Eosinophils Absolute: 0.1 10*3/uL (ref 0.0–0.7)
Eosinophils Relative: 3 % (ref 0–5)
HEMATOCRIT: 30.7 % — AB (ref 36.0–46.0)
Hemoglobin: 10 g/dL — ABNORMAL LOW (ref 12.0–15.0)
Lymphocytes Relative: 39 % (ref 12–46)
Lymphs Abs: 1.2 10*3/uL (ref 0.7–4.0)
MCH: 28 pg (ref 26.0–34.0)
MCHC: 32.6 g/dL (ref 30.0–36.0)
MCV: 86 fL (ref 78.0–100.0)
Monocytes Absolute: 0.4 10*3/uL (ref 0.1–1.0)
Monocytes Relative: 12 % (ref 3–12)
NEUTROS ABS: 1.4 10*3/uL — AB (ref 1.7–7.7)
NEUTROS PCT: 46 % (ref 43–77)
Platelets: 40 10*3/uL — ABNORMAL LOW (ref 150–400)
RBC: 3.57 MIL/uL — ABNORMAL LOW (ref 3.87–5.11)
RDW: 16 % — AB (ref 11.5–15.5)
WBC: 3 10*3/uL — ABNORMAL LOW (ref 4.0–10.5)

## 2015-02-14 MED ORDER — HYDROCOD POLST-CHLORPHEN POLST 10-8 MG/5ML PO LQCR
5.0000 mL | Freq: Once | ORAL | Status: AC
  Administered 2015-02-14: 5 mL via ORAL
  Filled 2015-02-14: qty 5

## 2015-02-14 MED ORDER — IOHEXOL 300 MG/ML  SOLN
100.0000 mL | Freq: Once | INTRAMUSCULAR | Status: AC | PRN
  Administered 2015-02-14: 100 mL via INTRAVENOUS

## 2015-02-14 MED ORDER — IOHEXOL 300 MG/ML  SOLN
25.0000 mL | INTRAMUSCULAR | Status: AC
  Administered 2015-02-14 (×2): 25 mL via ORAL

## 2015-02-14 NOTE — Care Management Note (Signed)
CARE MANAGEMENT NOTE 02/14/2015  Patient:  Vicki Bauer, Vicki Bauer   Account Number:  [id number]  Date Initiated:  02/10/2015  Documentation initiated by:  Dessa Phi  Subjective/Objective Assessment:   81 y/o f admitted w/mechanical falls, lethargy,thrombocytopenia.CW:UGQBVQXI, OSA,multiple med co morbidities.     Action/Plan:   From home w/son.Home CPAP-broken-must call company to fix.   Anticipated DC Date:  02/15/2015   Anticipated DC Plan:  [place]  CM consult      [facility]  HOME HE[facility]   Choice offered to / List presented to:  C-1 Patient           Status of service:  In process, will continue to follow Medicare Important Message given?  YES (If response is "NO", the following Medicare IM given date fields will be blank) Date Medicare IM given:  02/14/2015 Medicare IM given by:  Marney Doctor Date Additional Medicare IM given:   Additional Medicare IM given by:    Discharge Disposition:    Per UR Regulation:  Reviewed for med. necessity/level of care/duration of stay  If discussed at [place] of Stay Meetings, dates discussed:    Comments:  02/14/15 Marney Doctor RN,BSN,NCM [phone] Spoke to pt about disposition plans.  PT is recommending HHPT.  Pt offered choice for [facility] services and would like to speak with her son about it.  She has used HH in the past but can not remember what company. [place] list left with pt. CM will continue to follow.  02/10/15 Dessa Phi RN BSN NCM [phone] Spoke to patient about home CPAP-patient states CPAP machine is broken, & she has paid for it,company-[employer]. Informed patient to contact company [employer] for service repair or process for repairs.Patient will have her son to f/u. Patient voiced understanding.Await PT recommendations.

## 2015-02-14 NOTE — Progress Notes (Signed)
Pt placed on Auto CPAP 5-10 CMH20 via [facility].  Pt tolerating well at this time, RT to monitor and assess as needed.

## 2015-02-14 NOTE — Progress Notes (Signed)
[facility] PROGRESS NOTE  Assessment/Plan: Thrombocytopenia/  Pancytopenia: - Concerned about MDS, leukemia/lymphoma due to her history. - Consulted hematology  recommended a bone marrow biopsy on 3.7.2016, they want to wait for results  Petechiae/Rash - Her lower extremity; petechiae rash which extends to her back and is also on her left lower extremity. I will go ahead and hold antibiotics. Start on [employer].  Right hip pain: - No Fractures. Consult PT/OT.  AKI (acute kidney injury): - Resolved with hydration.  Essential hypertension: - Resume Lasix continue the Bisoprolol.  Hypothyroidism: - TSH within normal.  Acute encephalopathy: - It is now resolved.  - OSA: - cont c-pap at home.  Code Status: full Family Communication:none Disposition Plan: inpatient   Consultants:  none  Procedures:  Ct head  Antibiotics:  Vancomycin and Zosyn started on 03/08/2015-3.4.2016.  HPI/Subjective: No complains  Objective: Filed Vitals:   02/13/15 1310 02/13/15 2005 02/14/15 0205 02/14/15 0647  BP: 140/59 138/64 137/58 139/65  Pulse: 89 90 86 89  Temp: 98.5 F (36.9 C) 98.2 F (36.8 C) 98.1 F (36.7 C) 97.5 F (36.4 C)  TempSrc: Oral Oral Oral Oral  Resp: _0 Height:      Weight:   78.608 kg (173 lb 4.8 oz)   SpO2: 98% 98% 99% 96%    Intake/Output Summary (Last 24 hours) at 02/14/15 1208 Last data filed at 02/14/15 0900  Gross per 24 hour  Intake    120 ml  Output      0 ml  Net    120 ml   Filed Weights   02/12/15 0536 02/13/15 0436 02/14/15 0205  Weight: 72.303 kg (159 lb 6.4 oz) 75.025 kg (165 lb 6.4 oz) 78.608 kg (173 lb 4.8 oz)    Exam:  General: Alert, awake, oriented x3, in no acute distress.  HEENT: No bruits, no goiter.  Heart: Regular rate and rhythm. Lungs: Good air movement, clear Abdomen: Soft, nontender, nondistended, positive bowel sounds.  Neuro: Grossly intact, nonfocal. Skin: Petechiae rash throughout her  lower extremities and back.   Data Reviewed: Basic Metabolic Panel:  Recent Labs Lab 02/09/15 1038 02/10/15 0407 02/11/15 0944  NA 139 140 143  K 4.2 3.5 3.3*  CL 108 108 114*  CO2 _1 GLUCOSE 95 94 142*  BUN 24* 25* 21  CREATININE 1.05 1.08 0.91  CALCIUM 7.9* 7.9* 7.7*   Liver Function Tests:  Recent Labs Lab 02/09/15 1038 02/10/15 0407  AST 26 23  ALT 16 15  ALKPHOS 107 85  BILITOT 0.8 1.0  PROT 4.7* 4.5*  ALBUMIN 2.8* 2.7*    Recent Labs Lab 02/09/15 1038  LIPASE 21   No results for input(s): AMMONIA in the last 168 hours. CBC:  Recent Labs Lab 02/09/15 1038 02/09/15 1855 02/10/15 0407 02/10/15 0916 02/10/15 1730 02/13/15 0541  WBC 2.3*  --  3.0*  --  2.9* 3.1*  NEUTROABS 1.2*  --   --   --   --   --   HGB 11.9*  --  11.1*  --  11.1* 10.8*  HCT 37.8 40.9 35.1*  --  35.4* 34.0*  MCV 89.6  --  88.4  --  88.1 86.1  PLT 43*  --  27* PLATELET CLUMPS NOTED ON SMEAR, UNABLE TO ESTIMATE 33* 36*   Cardiac Enzymes: No results for input(s): CKTOTAL, CKMB, CKMBINDEX, TROPONINI in the last 168 hours. BNP (last 3 results) No results for input(s): BNP in the  last 8760 hours.  ProBNP (last 3 results) No results for input(s): PROBNP in the last 8760 hours.  CBG: No results for input(s): GLUCAP in the last 168 hours.  Recent Results (from the past 240 hour(s))  Blood culture (routine x 2)     Status: None (Preliminary result)   Collection Time: 02/09/15 10:38 AM  Result Value Ref Range Status   Specimen Description BLOOD LEFT ANTECUBITAL  Final   Special Requests BOTTLES DRAWN AEROBIC AND ANAEROBIC 5ML  Final   Culture   Final           BLOOD CULTURE RECEIVED NO GROWTH TO DATE CULTURE WILL BE HELD FOR 5 DAYS BEFORE ISSUING A FINAL NEGATIVE REPORT Performed at [facility]    Report Status PENDING  Incomplete  Blood culture (routine x 2)     Status: None (Preliminary result)   Collection Time: 02/09/15 10:38 AM  Result Value Ref Range  Status   Specimen Description BLOOD RIGHT ANTECUBITAL  Final   Special Requests BOTTLES DRAWN AEROBIC AND ANAEROBIC 4ML  Final   Culture   Final           BLOOD CULTURE RECEIVED NO GROWTH TO DATE CULTURE WILL BE HELD FOR 5 DAYS BEFORE ISSUING A FINAL NEGATIVE REPORT Performed at [facility]    Report Status PENDING  Incomplete  Urine culture     Status: None   Collection Time: 02/09/15 11:35 AM  Result Value Ref Range Status   Specimen Description URINE, CLEAN CATCH  Final   Special Requests NONE  Final   Colony Count   Final    >=100,000 COLONIES/ML Performed at [facility]    Culture   Final    ENTEROCOCCUS SPECIES Performed at [facility]    Report Status 02/11/2015 FINAL  Final   Organism ID, Bacteria ENTEROCOCCUS SPECIES  Final      Susceptibility   Enterococcus species - MIC*    AMPICILLIN <=2 SENSITIVE Sensitive     LEVOFLOXACIN 1 SENSITIVE Sensitive     NITROFURANTOIN <=16 SENSITIVE Sensitive     VANCOMYCIN 2 SENSITIVE Sensitive     TETRACYCLINE >=16 RESISTANT Resistant     * ENTEROCOCCUS SPECIES  Clostridium Difficile by PCR     Status: None   Collection Time: 02/11/15  6:50 PM  Result Value Ref Range Status   C difficile by pcr NEGATIVE NEGATIVE Final     Studies: Ct Biopsy  02/13/2015   INDICATION: Pancytopenia. Please perform CT guided bone marrow biopsy and aspiration for tissue diagnostic purposes.  EXAM: CT GUIDED BONE MARROW BIOPSY AND ASPIRATION  MEDICATIONS: Fentanyl 25 mcg IV; Versed 0.5 mg IV  ANESTHESIA/SEDATION: Sedation Time  5 minutes  CONTRAST:  None  COMPLICATIONS: None immediate.  PROCEDURE: Informed consent was obtained from the patient following an explanation of the procedure, risks, benefits and alternatives. The patient understands, agrees and consents for the procedure. All questions were addressed. A time out was performed prior to the initiation of the procedure. The patient was positioned prone and non-contrast  localization CT was performed of the pelvis to demonstrate the iliac marrow spaces. The operative site was prepped and draped in the usual sterile fashion.  Under sterile conditions and local anesthesia, a 22 gauge spinal needle was utilized for procedural planning. Next, an 11 gauge coaxial bone biopsy needle was advanced into the left iliac marrow space. Needle position was confirmed with CT imaging. Initially, bone marrow aspiration was performed. Next, a  bone marrow biopsy was obtained with the 11 gauge outer bone marrow device. The 11 gauge coaxial bone biopsy needle was re-advanced into a slightly different location within the left iliac marrow space, positioning was confirmed and an additional bone marrow biopsy was obtained. Samples were prepared with the cytotechnologist and deemed adequate. The needle was removed intact. Hemostasis was obtained with compression and a dressing was placed. The patient tolerated the procedure well without immediate post procedural complication.  IMPRESSION: Successful CT guided left iliac bone marrow aspiration and core biopsies.   Electronically Signed   By: Sandi Mariscal M.D.   On: 02/13/2015 11:29    Scheduled Meds: . buPROPion  200 mg Oral BID  . cyanocobalamin  1,000 mcg Intramuscular Daily  . furosemide  40 mg Oral Daily  . levofloxacin  750 mg Oral Q48H  . levothyroxine  25 mcg Oral QAC breakfast  . LORazepam  1 mg Oral QHS  . multivitamin with minerals  1 tablet Oral Daily  . pantoprazole  40 mg Oral Daily  . pregabalin  150 mg Oral BID  . saccharomyces boulardii  250 mg Oral BID  . sodium chloride  3 mL Intravenous Q12H   Continuous Infusions:     Charlynne Cousins  [facility] Pager [phone]. If 7PM-7AM, please contact night-coverage at [email or web address], password [facility] 02/14/2015, 12:08 PM  LOS: 5 days

## 2015-02-14 NOTE — Progress Notes (Signed)
Vicki Bauer had her bone marrow biopsy yesterday. Results probably will not be out until tomorrow.  Has ecchymoses. There is no obvious bleeding.  She's alert. She is eating pretty well. All of her vital signs are relatively stable.  With the lab work, her plate count was 44,739 yesterday. White cell count 3.1. Hemoglobin 10.8.  Again, the key will be her bone marrow biopsy. Patient does have splenomegaly on ultrasound of her kidneys.  I think a CT scan probably would not be a bad idea. I think this could help [place] look at her spleen a bit better, her liver and any lymphadenopathy.  On exam, there is no obvious palpable adenopathy. Lungs are clear. Cardiac exam regular rate and rhythm. Abdomen is soft. Spleen tip of my be palpable with deep inspiration. There is no obvious hepatomegaly. Extremities shows some trace edema. Skin exam shows some scattered ecchymoses.  I have to suspect that she is going to end up having myelodysplasia. Again, I find it very interesting that she had chemotherapy about 15 years ago.  If we do find myelodysplasia, then the cytogenetics will be essential.  If we find myelodysplasia, then I might want to try Nplate for her thrombocytopenia.  We will continue to follow along.  Pete E.  Psalm 6:2

## 2015-02-14 NOTE — Progress Notes (Signed)
Patient transferred from 4th floor to room 1333. RN called for CPAP assistance per patient request. RT placed on CPAP with auto-titration mode. Tolerated well.

## 2015-02-14 NOTE — Progress Notes (Signed)
Patient refuses to use nocturnal CPAP tonight. She states " I'm doing fine without it". RT will continue to monitor.

## 2015-02-14 NOTE — Progress Notes (Signed)
Called to patient room by RN per patient request with complaints about CPAP. Upon arrival to room, patient noted to be asleep with CPAP in her hand. Removed mask and offered assistance with re-application. Patient refuses to wear at this time. RT will continue to follow.

## 2015-02-14 NOTE — Progress Notes (Signed)
Pt stated that she will give the CPAP another try tonight, Pt wants CPAP after night time meds are given.  RT to monitor and assess as needed.

## 2015-02-15 ENCOUNTER — Inpatient Hospital Stay (HOSPITAL_COMMUNITY): Payer: MEDICARE

## 2015-02-15 DIAGNOSIS — R413 Other amnesia: Secondary | ICD-10-CM

## 2015-02-15 DIAGNOSIS — J209 Acute bronchitis, unspecified: Secondary | ICD-10-CM | POA: Diagnosis present

## 2015-02-15 DIAGNOSIS — R944 Abnormal results of kidney function studies: Secondary | ICD-10-CM

## 2015-02-15 DIAGNOSIS — R591 Generalized enlarged lymph nodes: Secondary | ICD-10-CM

## 2015-02-15 LAB — CBC WITH DIFFERENTIAL/PLATELET
BASOS ABS: 0 10*3/uL (ref 0.0–0.1)
Basophils Relative: 0 % (ref 0–1)
EOS ABS: 0.1 10*3/uL (ref 0.0–0.7)
Eosinophils Relative: 2 % (ref 0–5)
HEMATOCRIT: 32.1 % — AB (ref 36.0–46.0)
Hemoglobin: 10.5 g/dL — ABNORMAL LOW (ref 12.0–15.0)
LYMPHS PCT: 50 % — AB (ref 12–46)
Lymphs Abs: 1.9 10*3/uL (ref 0.7–4.0)
MCH: 28.2 pg (ref 26.0–34.0)
MCHC: 32.7 g/dL (ref 30.0–36.0)
MCV: 86.1 fL (ref 78.0–100.0)
MONOS PCT: 10 % (ref 3–12)
Monocytes Absolute: 0.4 10*3/uL (ref 0.1–1.0)
NEUTROS ABS: 1.4 10*3/uL — AB (ref 1.7–7.7)
NEUTROS PCT: 37 % — AB (ref 43–77)
Platelets: 56 10*3/uL — ABNORMAL LOW (ref 150–400)
RBC: 3.73 MIL/uL — AB (ref 3.87–5.11)
RDW: 16.2 % — ABNORMAL HIGH (ref 11.5–15.5)
WBC: 3.7 10*3/uL — ABNORMAL LOW (ref 4.0–10.5)

## 2015-02-15 LAB — BASIC METABOLIC PANEL
Anion gap: 7 (ref 5–15)
BUN: 32 mg/dL — ABNORMAL HIGH (ref 6–23)
CO2: 24 mmol/L (ref 19–32)
CREATININE: 1.73 mg/dL — AB (ref 0.50–1.10)
Calcium: 8.6 mg/dL (ref 8.4–10.5)
Chloride: 111 mmol/L (ref 96–112)
GFR calc Af Amer: 30 mL/min — ABNORMAL LOW (ref >90)
GFR calc non Af Amer: 26 mL/min — ABNORMAL LOW (ref >90)
Glucose, Bld: 80 mg/dL (ref 70–99)
Potassium: 4 mmol/L (ref 3.5–5.1)
Sodium: 142 mmol/L (ref 135–145)

## 2015-02-15 LAB — CULTURE, BLOOD (ROUTINE X 2)
CULTURE: NO GROWTH
Culture: NO GROWTH

## 2015-02-15 MED ORDER — GUAIFENESIN ER 600 MG PO TB12
1200.0000 mg | ORAL_TABLET | Freq: Two times a day (BID) | ORAL | Status: DC
  Administered 2015-02-15 – 2015-02-17 (×5): 1200 mg via ORAL
  Filled 2015-02-15 (×7): qty 2

## 2015-02-15 MED ORDER — SODIUM CHLORIDE 0.9 % IV SOLN
INTRAVENOUS | Status: DC
  Administered 2015-02-15 – 2015-02-16 (×3): via INTRAVENOUS

## 2015-02-15 MED ORDER — BENZONATATE 100 MG PO CAPS
200.0000 mg | ORAL_CAPSULE | Freq: Three times a day (TID) | ORAL | Status: DC | PRN
  Administered 2015-02-16 – 2015-02-17 (×3): 200 mg via ORAL
  Filled 2015-02-15 (×3): qty 2

## 2015-02-15 NOTE — Progress Notes (Addendum)
Clinical Social Work Department CLINICAL SOCIAL WORK PLACEMENT NOTE 02/15/2015  Patient:  LANIAH, GRIMM  Account Number:  [id number] Admit date:  02/09/2015  Clinical Social Worker:  Maryln Manuel  Date/time:  02/15/2015 11:59 AM  Clinical Social Work is seeking post-discharge placement for this patient at the following level of care:   SKILLED NURSING   (*CSW will update this form in Epic as items are completed)   02/15/2015  Patient/family provided with [place] Department of Clinical Social Work's list of facilities offering this level of care within the geographic area requested by the patient (or if unable, by the patient's family).  02/15/2015  Patient/family informed of their freedom to choose among providers that offer the needed level of care, that participate in Medicare, Medicaid or managed care program needed by the patient, have an available bed and are willing to accept the patient.  02/15/2015  Patient/family informed of [facility]' ownership interest in [facility], as well as of the fact that they are under no obligation to receive care at this facility.  PASARR submitted to EDS on 02/15/2015 PASARR number received on 02/15/2015  FL2 transmitted to all facilities in geographic area requested by pt/family on  02/15/2015 FL2 transmitted to all facilities within larger geographic area on   Patient informed that his/her managed care company has contracts with or will negotiate with  certain facilities, including the following:     Patient/family informed of bed offers received:  02/16/2015 Patient chooses bed at [facility] and [place] recommends and patient chooses bed at    Patient to be transferred to  on   Patient to be transferred to facility by  Patient and family notified of transfer on  Name of family member notified:    The following physician request were entered in Epic:   Additional Comments:   Alison Murray,  MSW, [place] Work [phone]

## 2015-02-15 NOTE — Progress Notes (Signed)
Vicki Bauer is doing okay. She is concerned about her memory. I'm not sure what we can do about that. She says that she has a hard time finding the right word to say. She's worried that when she goes home that she will not be able to adequately express herself to her son.  We are still awaiting the bone marrow report. Hopefully, this will be back today.  She had a CT scan yesterday. It showed splenomegaly with some scattered lesions. She had some mildly enlarged retroperitoneal and pelvic lymph nodes. As such, one might make a case for lymphoma with her. She certainly could have a follicular low-grade lymphoma that she has always had and now it is relapsing.  Her labs are pretty stable. Her platelet count is not yet back.  She's had no bleeding. She does have some petechia.  With the labs, her creatinine is up a little bit. We'll have to watch this carefully given that she had the IV dye for her CT scan.  On her physical exam, her vital signs are all stable. Blood pressure 148/49. Pulse is 72. Her lungs are clear. Cardiac exam regular rate and rhythm. Abdomen is soft. She has a spleen tip at the left costal margin. Extremities shows some chronic nonpitting edema in the legs.  Again, we are awaiting the bone marrow report.  Her kidney function as he watched closely.  We will continue to follow along. I very much appreciate all the great care that she is getting on 3 W.!!  Lum Keas  Exodus 34:10

## 2015-02-15 NOTE — Progress Notes (Signed)
[facility] PROGRESS NOTE  CHANTELLE VERDI FOY:[id number] DOB: 08/28/1928 DOA: 02/09/2015 PCP: Lilian Coma, MD  Summary I have seen and examined Ms. Leverette at bedside and reviewed her chart. Appreciate oncology. Vicki Bauer is a pleasant 81 year old female with essential hypertension, hypothyroidism, CAD, and obstructive sleep apnea who presented with mechanical falls 2 on the morning of admission, and was found to have pancytopenia requiring bone marrow biopsy was results are pending. Patient complains of cough. Will obtain chest x-ray and defer to oncology for further management of pancytopenia. Plan Thrombocytopenia/ Pancytopenia: - Concerned about MDS, leukemia/lymphoma due to her history. - Consulted hematology recommended a bone marrow biopsy on 3.7.2016, results pending  Petechiae/Rash - Monitor  Right hip pain: - No Fractures. Consult PT/OT.  AKI (acute kidney injury): - Resolved with hydration.  Essential hypertension: - Continue Lasix and Bisoprolol.  Hypothyroidism: - TSH within normal limits.  Acute encephalopathy: - It is now resolved.  - OSA/cough: - cont c-pap at home. - Check x-ray. - Mucinex/Tessalon Perles  Code Status: full Family Communication:none Disposition Plan: inpatient   Consultants:  none  Procedures:  Ct head  Antibiotics:  Vancomycin and Zosyn started on 03/08/2015-3.4.2016.  HPI/Subjective: No complains  Objective: Filed Vitals:   02/15/15 1359  BP: 145/54  Pulse: 77  Temp: 98.3 F (36.8 C)  Resp: 18    Intake/Output Summary (Last 24 hours) at 02/15/15 2207 Last data filed at 02/15/15 1400  Gross per 24 hour  Intake    600 ml  Output      0 ml  Net    600 ml   Filed Weights   02/13/15 0436 02/14/15 0205 02/15/15 0436  Weight: 75.025 kg (165 lb 6.4 oz) 78.608 kg (173 lb 4.8 oz) 79.606 kg (175 lb 8 oz)    Exam:   General:  Comfortable at rest. Coughing.  Cardiovascular: S1-S2  normal. No murmurs. Pulse regular.  Respiratory: Good air entry bilaterally. No rhonchi or rales.  Abdomen: Soft and nontender. Normal bowel sounds. No organomegaly.  Musculoskeletal: No pedal edema   Neurological: Intact  Data Reviewed: Basic Metabolic Panel:  Recent Labs Lab 02/09/15 1038 02/10/15 0407 02/11/15 0944 02/15/15 0541  NA 139 140 143 142  K 4.2 3.5 3.3* 4.0  CL 108 108 114* 111  CO2 _0 GLUCOSE 95 94 142* 80  BUN 24* 25* 21 32*  CREATININE 1.05 1.08 0.91 1.73*  CALCIUM 7.9* 7.9* 7.7* 8.6   Liver Function Tests:  Recent Labs Lab 02/09/15 1038 02/10/15 0407  AST 26 23  ALT 16 15  ALKPHOS 107 85  BILITOT 0.8 1.0  PROT 4.7* 4.5*  ALBUMIN 2.8* 2.7*    Recent Labs Lab 02/09/15 1038  LIPASE 21   No results for input(s): AMMONIA in the last 168 hours. CBC:  Recent Labs Lab 02/09/15 1038  02/10/15 0407 02/10/15 0916 02/10/15 1730 02/13/15 0541 02/14/15 0905 02/15/15 0541  WBC 2.3*  --  3.0*  --  2.9* 3.1* 3.0* 3.7*  NEUTROABS 1.2*  --   --   --   --   --  1.4* 1.4*  HGB 11.9*  --  11.1*  --  11.1* 10.8* 10.0* 10.5*  HCT 37.8  < > 35.1*  --  35.4* 34.0* 30.7* 32.1*  MCV 89.6  --  88.4  --  88.1 86.1 86.0 86.1  PLT 43*  --  27* PLATELET CLUMPS NOTED ON SMEAR, UNABLE TO ESTIMATE 33* 36* 40* 56*  < > =  values in this interval not displayed. Cardiac Enzymes: No results for input(s): CKTOTAL, CKMB, CKMBINDEX, TROPONINI in the last 168 hours. BNP (last 3 results) No results for input(s): BNP in the last 8760 hours.  ProBNP (last 3 results) No results for input(s): PROBNP in the last 8760 hours.  CBG: No results for input(s): GLUCAP in the last 168 hours.  Recent Results (from the past 240 hour(s))  Blood culture (routine x 2)     Status: None   Collection Time: 02/09/15 10:38 AM  Result Value Ref Range Status   Specimen Description BLOOD LEFT ANTECUBITAL  Final   Special Requests BOTTLES DRAWN AEROBIC AND ANAEROBIC 5ML  Final    Culture   Final    NO GROWTH 5 DAYS Performed at [facility]    Report Status 02/15/2015 FINAL  Final  Blood culture (routine x 2)     Status: None   Collection Time: 02/09/15 10:38 AM  Result Value Ref Range Status   Specimen Description BLOOD RIGHT ANTECUBITAL  Final   Special Requests BOTTLES DRAWN AEROBIC AND ANAEROBIC 4ML  Final   Culture   Final    NO GROWTH 5 DAYS Performed at [facility]    Report Status 02/15/2015 FINAL  Final  Urine culture     Status: None   Collection Time: 02/09/15 11:35 AM  Result Value Ref Range Status   Specimen Description URINE, CLEAN CATCH  Final   Special Requests NONE  Final   Colony Count   Final    >=100,000 COLONIES/ML Performed at [facility]    Culture   Final    ENTEROCOCCUS SPECIES Performed at [facility]    Report Status 02/11/2015 FINAL  Final   Organism ID, Bacteria ENTEROCOCCUS SPECIES  Final      Susceptibility   Enterococcus species - MIC*    AMPICILLIN <=2 SENSITIVE Sensitive     LEVOFLOXACIN 1 SENSITIVE Sensitive     NITROFURANTOIN <=16 SENSITIVE Sensitive     VANCOMYCIN 2 SENSITIVE Sensitive     TETRACYCLINE >=16 RESISTANT Resistant     * ENTEROCOCCUS SPECIES  Clostridium Difficile by PCR     Status: None   Collection Time: 02/11/15  6:50 PM  Result Value Ref Range Status   C difficile by pcr NEGATIVE NEGATIVE Final     Studies: Ct Abdomen Pelvis W Contrast  02/14/2015   CLINICAL DATA:  Evaluate splenomegaly.  EXAM: CT ABDOMEN AND PELVIS WITH CONTRAST  TECHNIQUE: Multidetector CT imaging of the abdomen and pelvis was performed using the standard protocol following bolus administration of intravenous contrast.  CONTRAST:  147m OMNIPAQUE IOHEXOL 300 MG/ML  SOLN  COMPARISON:  None.  FINDINGS: Lower chest: The lung bases are clear of acute process. Minimal dependent bibasilar atelectasis. There are basilar scarring changes. No worrisome pulmonary lesions. The heart is upper limits of  normal in size. No pericardial effusion. Coronary artery calcifications are noted. The distal esophagus is grossly normal.  Hepatobiliary: No focal hepatic lesions or intrahepatic biliary dilatation. The gallbladder is surgically absent. No common bile duct dilatation.  Pancreas: Diffuse fatty change involving the pancreatic head but no mass or acute inflammation.  Spleen: The spleen measures 13 x 11 x 10 cm. Splenic volume is 715 cubic cm. Small scattered splenic lesions are noted.  Adrenals/Urinary Tract: The adrenal glands and kidneys are unremarkable. A lower pole left renal cyst is noted. No renal calculi or mass. No hydronephrosis. The bladder is grossly normal.  Stomach/Bowel: The stomach, duodenum, small bowel and colon are grossly normal. No inflammatory changes, mass lesions or obstructive findings. Moderate sigmoid diverticulosis without findings for acute diverticulitis  Vascular/Lymphatic: There are scattered borderline retroperitoneal lymph nodes an pelvic lymph nodes. Could not exclude an early lymphoproliferative process, vertically given the changes involving the spleen. Small scattered inguinal lymph nodes are also noted.  Reproductive: The uterus is surgically absent. Both ovaries are still present and appear normal.  Other: No abdominal hernia or subcutaneous lesions.  Musculoskeletal: Advanced degenerative changes involving the spine. There are bilateral pars defects at L4 with a grade 1-2 spondylolisthesis. Moderate scoliosis. No destructive bone lesions.  IMPRESSION: Splenomegaly with small scattered splenic lesions. This in conjunction with borderline retroperitoneal and pelvic lymph nodes could suggest a lymphoproliferative process such as lymphoma.   Electronically Signed   By: Marijo Sanes M.D.   On: 02/14/2015 12:24    Scheduled Meds: . buPROPion  200 mg Oral BID  . cyanocobalamin  1,000 mcg Intramuscular Daily  . furosemide  40 mg Oral Daily  . guaiFENesin  1,200 mg Oral BID  .  levofloxacin  750 mg Oral Q48H  . levothyroxine  25 mcg Oral QAC breakfast  . LORazepam  1 mg Oral QHS  . multivitamin with minerals  1 tablet Oral Daily  . pantoprazole  40 mg Oral Daily  . pregabalin  150 mg Oral BID  . saccharomyces boulardii  250 mg Oral BID  . sodium chloride  3 mL Intravenous Q12H   Continuous Infusions: . sodium chloride 50 mL/hr at 02/15/15 1258     Time spent: 25 minutes    Lynnett Langlinais  [facility] Pager [phone]. If 7PM-7AM, please contact night-coverage at [email or web address], password [facility] 02/15/2015, 10:07 PM  LOS: 6 days

## 2015-02-15 NOTE — Progress Notes (Signed)
Pt stated that she would self administer CPAP when ready for bed.  Pt to notify RT if any assistance is required, RT to monitor and assess as needed.

## 2015-02-15 NOTE — Care Management Note (Signed)
CARE MANAGEMENT NOTE 02/15/2015  Patient:  Vicki Bauer, Vicki Bauer   Account Number:  [id number]  Date Initiated:  02/10/2015  Documentation initiated by:  Dessa Phi  Subjective/Objective Assessment:   81 y/o f admitted w/mechanical falls, lethargy,thrombocytopenia.UU:[id number], OSA,multiple med co morbidities.     Action/Plan:   From home w/son.Home CPAP-broken-must call company to fix.   Anticipated DC Date:  02/16/2015   Anticipated DC Plan:  [place]  CM consult      [facility] Choice  HOME HEALTH   Choice offered to / List presented to:  C-1 Patient           Status of service:  In process, will continue to follow Medicare Important Message given?  YES (If response is "NO", the following Medicare IM given date fields will be blank) Date Medicare IM given:  02/14/2015 Medicare IM given by:  Marney Doctor Date Additional Medicare IM given:   Additional Medicare IM given by:    Discharge Disposition:    Per UR Regulation:  Reviewed for med. necessity/level of care/duration of stay  If discussed at [place] of Stay Meetings, dates discussed:    Comments:  02/15/15 Marney Doctor RN,BSN,NCM Spoke with pt again about disposition. Pt states that she lives with her son and he works all day so she is by herself all day at home.  PT is recommending 24 hr supervision.  Pt asked about private duty care and she states she could only do that if insurance paid for it.  It was explained to pt that private duty is self pay.  Pt expressed understanding that she needs 24hr supervision and states she would be willing to go to short term snf to get stronger. THis CM requested permission to call son to talk to him about it as well.  Spoke with son Marcello Moores) and he was also in agreement that pt might need short term rehab to get stronger. CSW made aware and CM will continue to follow.  02/14/15 Marney Doctor RN,BSN,NCM [phone] Spoke to pt about disposition  plans.  PT is recommending HHPT.  Pt offered choice for [facility] services and would like to speak with her son about it.  She has used HH in the past but can not remember what company. [place] list left with pt. CM will continue to follow.  02/10/15 Dessa Phi RN BSN NCM [phone] Spoke to patient about home CPAP-patient states CPAP machine is broken, & she has paid for it,company-[employer]. Informed patient to contact company [employer] for service repair or process for repairs.Patient will have her son to f/u. Patient voiced understanding.Await PT recommendations.

## 2015-02-15 NOTE — Progress Notes (Signed)
Physical Therapy Treatment Patient Details Name: Vicki Bauer MRN: [id number] DOB: 11/17/1928 Today's Date: 02/15/2015    History of Present Illness 81 yo female admitted with UTI, falls, LE cellulitis, sepsis. Hx of HTN, CAD    PT Comments    Pt making steady progress. D/c recommendation updated to SNF due to not having 24/7 S available.  Pt should do well with short term SNF rehab with goal of returning home with son.  Follow Up Recommendations  SNF (per SW notes, no one available 24/7)     Equipment Recommendations  None recommended by PT    Recommendations for Other Services       Precautions / Restrictions Precautions Precautions: Fall Restrictions Weight Bearing Restrictions: No    Mobility  Bed Mobility Overal bed mobility: Needs Assistance Bed Mobility: Sit to Supine       Sit to supine: Min guard   General bed mobility comments: cues for positioning in bed  Transfers Overall transfer level: Needs assistance Equipment used: Rolling walker (2 wheeled) Transfers: Sit to/from Stand Sit to Stand: Min assist         General transfer comment: MIN A from recliner with decreased use of R UE due to chronic shoulder issues.  Ambulation/Gait Ambulation/Gait assistance: Min guard Ambulation Distance (Feet): 120 Feet Assistive device: Rolling walker (2 wheeled) Gait Pattern/deviations: Step-through pattern;Decreased stride length Gait velocity: decreased   General Gait Details: Pt required cueing for turning with RW for increased safety.   Stairs            Wheelchair Mobility    Modified Rankin (Stroke Patients Only)       Balance Overall balance assessment: Needs assistance Sitting-balance support: Feet supported Sitting balance-Leahy Scale: Good     Standing balance support: Bilateral upper extremity supported Standing balance-Leahy Scale: Fair                      Cognition Arousal/Alertness: Awake/alert Behavior During  Therapy: WFL for tasks assessed/performed Overall Cognitive Status: Within Functional Limits for tasks assessed                      Exercises      General Comments General comments (skin integrity, edema, etc.): Pt reports she has been using a bed pan.  Spoke with nursing about ambulating to bathroom or using BSC.      Pertinent Vitals/Pain Pain Assessment: No/denies pain    Home Living                      Prior Function            PT Goals (current goals can now be found in the care plan section) Acute Rehab PT Goals PT Goal Formulation: With patient Time For Goal Achievement: 02/24/15 Potential to Achieve Goals: Good Progress towards PT goals: Progressing toward goals    Frequency  Min 3X/week    PT Plan Discharge plan needs to be updated    Co-evaluation             End of Session Equipment Utilized During Treatment: Gait belt Activity Tolerance: Patient tolerated treatment well Patient left: in bed;with call bell/phone within reach     Time: 0122-0140 PT Time Calculation (min) (ACUTE ONLY): 18 min  Charges:  $Gait Training: 8-22 mins                    G Codes:  Sheppard Luckenbach LUBECK 02/15/2015, 1:54 PM

## 2015-02-15 NOTE — Progress Notes (Signed)
CSW received referral from [facility] that [place] spoke with pt regarding disposition as recommendation from PT was [facility] with 24 hour supervision and pt stated that she lives with her son and he works all day so she is by herself all day at home.Pt was agreeable to [facility] for CSW to explore short term rehab options as pt does not have 24 hour supervision at home. RNCM was provided permission from pt to contact pt son to discuss. Per RNCM, pt son, Marcello Moores was also in agreement that pt might need short term rehab to get stronger.   CSW attempted to meet with pt at bedside to discuss and explain process of SNF search. Pt was sleeping soundly at this time and did not awake to CSW knocking on door and calling pt name.   CSW completed FL2 and initiated SNF search to [facility] given that pt and pt son provided permission to [facility] for CSW to initiate SNF search.  CSW to follow up with pt to discuss short term rehab at SNF further and complete full psychosocial assessment at that time.   CSW to continue to follow.  Alison Murray, MSW, [place] Work [phone]

## 2015-02-16 DIAGNOSIS — D469 Myelodysplastic syndrome, unspecified: Secondary | ICD-10-CM

## 2015-02-16 LAB — CBC WITH DIFFERENTIAL/PLATELET
BASOS ABS: 0 10*3/uL (ref 0.0–0.1)
Basophils Relative: 0 % (ref 0–1)
EOS ABS: 0 10*3/uL (ref 0.0–0.7)
Eosinophils Relative: 1 % (ref 0–5)
HCT: 30.4 % — ABNORMAL LOW (ref 36.0–46.0)
Hemoglobin: 9.8 g/dL — ABNORMAL LOW (ref 12.0–15.0)
Lymphocytes Relative: 49 % — ABNORMAL HIGH (ref 12–46)
Lymphs Abs: 1.7 10*3/uL (ref 0.7–4.0)
MCH: 27.5 pg (ref 26.0–34.0)
MCHC: 32.2 g/dL (ref 30.0–36.0)
MCV: 85.4 fL (ref 78.0–100.0)
MONO ABS: 0.3 10*3/uL (ref 0.1–1.0)
Monocytes Relative: 10 % (ref 3–12)
Neutro Abs: 1.4 10*3/uL — ABNORMAL LOW (ref 1.7–7.7)
Neutrophils Relative %: 40 % — ABNORMAL LOW (ref 43–77)
Platelets: 59 10*3/uL — ABNORMAL LOW (ref 150–400)
RBC: 3.56 MIL/uL — AB (ref 3.87–5.11)
RDW: 16.2 % — ABNORMAL HIGH (ref 11.5–15.5)
WBC: 3.5 10*3/uL — ABNORMAL LOW (ref 4.0–10.5)

## 2015-02-16 LAB — BASIC METABOLIC PANEL
Anion gap: 6 (ref 5–15)
BUN: 36 mg/dL — ABNORMAL HIGH (ref 6–23)
CALCIUM: 8.7 mg/dL (ref 8.4–10.5)
CO2: 26 mmol/L (ref 19–32)
Chloride: 115 mmol/L — ABNORMAL HIGH (ref 96–112)
Creatinine, Ser: 1.68 mg/dL — ABNORMAL HIGH (ref 0.50–1.10)
GFR calc Af Amer: 31 mL/min — ABNORMAL LOW (ref >90)
GFR calc non Af Amer: 27 mL/min — ABNORMAL LOW (ref >90)
Glucose, Bld: 85 mg/dL (ref 70–99)
Potassium: 3.5 mmol/L (ref 3.5–5.1)
SODIUM: 147 mmol/L — AB (ref 135–145)

## 2015-02-16 MED ORDER — PREDNISONE 20 MG PO TABS
40.0000 mg | ORAL_TABLET | Freq: Every day | ORAL | Status: DC
  Administered 2015-02-16 – 2015-02-17 (×2): 40 mg via ORAL
  Filled 2015-02-16 (×2): qty 2

## 2015-02-16 MED ORDER — BISOPROLOL FUMARATE 5 MG PO TABS
5.0000 mg | ORAL_TABLET | Freq: Every day | ORAL | Status: DC
  Administered 2015-02-16 – 2015-02-17 (×2): 5 mg via ORAL
  Filled 2015-02-16 (×2): qty 1

## 2015-02-16 MED ORDER — POTASSIUM CHLORIDE ER 10 MEQ PO TBCR
40.0000 meq | EXTENDED_RELEASE_TABLET | Freq: Once | ORAL | Status: AC
Start: 2015-02-16 — End: 2015-02-16
  Administered 2015-02-16: 40 meq via ORAL
  Filled 2015-02-16: qty 4

## 2015-02-16 MED ORDER — IPRATROPIUM BROMIDE 0.02 % IN SOLN: 0.5000 mg | Freq: Four times a day (QID) | RESPIRATORY_TRACT | Status: DC | PRN

## 2015-02-16 MED ORDER — PREGABALIN 75 MG PO CAPS
75.0000 mg | ORAL_CAPSULE | Freq: Every day | ORAL | Status: DC
  Administered 2015-02-17: 75 mg via ORAL
  Filled 2015-02-16: qty 1

## 2015-02-16 MED ORDER — DEXTROSE-NACL 5-0.9 % IV SOLN: INTRAVENOUS | Status: DC

## 2015-02-16 MED ORDER — POTASSIUM CHLORIDE 20 MEQ/15ML (10%) PO SOLN
40.0000 meq | Freq: Once | ORAL | Status: DC
  Filled 2015-02-16: qty 30

## 2015-02-16 MED ORDER — DEXTROSE-NACL 5-0.9 % IV SOLN
INTRAVENOUS | Status: DC
  Administered 2015-02-16: 21:00:00 via INTRAVENOUS

## 2015-02-16 MED ORDER — ALBUTEROL SULFATE (2.5 MG/3ML) 0.083% IN NEBU: 2.5000 mg | INHALATION_SOLUTION | RESPIRATORY_TRACT | Status: DC | PRN

## 2015-02-16 NOTE — Progress Notes (Signed)
Clinical Social Work Department BRIEF PSYCHOSOCIAL ASSESSMENT 02/16/2015  Patient:  Vicki Bauer, Vicki Bauer     Account Number:  [id number]     Admit date:  02/09/2015  Clinical Social Worker:  Maryln Manuel  Date/Time:  02/16/2015 09:30 AM  Referred by:  Care Management  Date Referred:  02/16/2015 Referred for  SNF Placement   Other Referral:   Interview type:  Patient Other interview type:   and patient son via telephone    PSYCHOSOCIAL DATA Living Status:  FAMILY Admitted from facility:   Level of care:   Primary support name:  Marcello Moores [place]/[phone] Primary support relationship to patient:  CHILD, ADULT Degree of support available:   strong    CURRENT CONCERNS Current Concerns  Post-Acute Placement   Other Concerns:    SOCIAL WORK ASSESSMENT / PLAN CSW received referral from [facility] that pt and pt son wished to explore short term rehab options as pt does not have 24 hour care at home. CSW initiated SNF search 02/15/2015 as RNCM had received permission for initiation of SNF search from pt and pt son.    CSW followed up with pt this morning. CSW introduced self and explained role. Pt discussed that she was aware that RNCM had notified CSW that pt wanted to explore short term rehab options. CSW discussed that pt that CSW initiated SNF search yesterday and provided pt with the SNF bed offers. Pt shared that she wanted to discuss offers with her son, but she stated that she had trouble seeing the paper due to macular degeneration. CSW provided support and discussed with pt that CSW could contact pt son in order to provide the bed offers. Pt agreeable.    CSW contacted pt son, Gershon Mussel via telephone. CSW introduced self and explained role. CSW discussed with pt son that pt wished for CSW to provide pt son with the bed offers for short term rehab in order for pt son to assist pt in making a decision. CSW provided SNF bed offers. Pt son discussed that he feels that [facility]  and Rehab will be the best place for pt. CSW entered pt room at this time and placed pt son on speaker phone in order for pt and pt son to speak about pt son preference for [facility] and Rehab. Pt is agreeable to pt son choice of [facility].    CSW contacted [facility] and Rehab and confirmed bed availability for when pt medically ready for discharge.    Per MD, pt not yet medically ready for discharge today. Pt, pt son, and [facility] updated.    CSW to continue to follow to provide support and assist with pt discharge to [facility] when pt medically ready for discharge.   Assessment/plan status:  Psychosocial Support/Ongoing Assessment of Needs Other assessment/ plan:   discharge planning   Information/referral to community resources:   [facility] list    PATIENT'S/FAMILY'S RESPONSE TO PLAN OF CARE: Pt alert and oriented x 4. Pt eager for rehab at SNF as she feels that it will help her to get stronger and she is looking forward to the social aspect of the facility. Support provided to pt as she shared that sometimes it is lonely in the hospital and CSW discussed that many social things that [facility] will offer. Pt son pleased that pt agreeable to short term rehab in order to ensure pt safety once pt return home as pt son works during the day. Pt  and pt son agreement to plan for [facility] and Rehab when pt medically ready for discharge.   Alison Murray, MSW, [place] Work [phone]

## 2015-02-16 NOTE — Progress Notes (Signed)
[facility] PROGRESS NOTE  Vicki Bauer:[id number] DOB: [birth year] DOA: 02/09/2015 PCP: Lilian Coma, MD  Summary Appreciate oncology. Retina Vicki Bauer is a pleasant 81 year old female with essential hypertension, hypothyroidism, CAD, and obstructive sleep apnea, who presented with mechanical falls 2 on the morning of admission, and was found to have pancytopenia requiring bone marrow biopsy whose results suggest Myelodysplasia. Patient complained of cough on 02/15/15 prompting chest x-ray which showed "Diffuse bronchial thickening suggesting bronchitis or reactive airways disease". Will continue Levaquin and add systemic steroids/bronchodilators for acute bronchitis. Patient is dyspneic today. She is also hypernatremic with sodium 147. Will change IV fluids to D5NS. Plan Thrombocytopenia/ Pancytopenia/Petechiae/Rash/: - MDS - Follow oncology recommendations  Acute bronchitis/OSA - Continue Levaquin adjusted for creatinine clearance - Prednisone/albuterol/Atrovent - cont c-pap at home. - Mucinex/Tessalon Perles  AKI (acute kidney injury)/Hypernatremia: - Improved with hydration. Hold Lasix - Change fluids to D5NS  Essential hypertension: - Continue Bisoprolol.  Hypothyroidism: - TSH within normal limits.  Acute encephalopathy: - It is now resolved.  Code Status: full Family Communication:none Disposition Plan: SNF   Consultants:  Oncology  Procedures:  Ct head  Antibiotics:  Vancomycin and Zosyn started on 03/08/2015-3.4.2016.  Levaquin  HPI/Subjective: No complains  Objective: Filed Vitals:   02/16/15 0535  BP: 152/60  Pulse: 85  Temp: 97.7 F (36.5 C)  Resp: 20    Intake/Output Summary (Last 24 hours) at 02/16/15 1214 Last data filed at 02/16/15 0900  Gross per 24 hour  Intake    480 ml  Output      0 ml  Net    480 ml   Filed Weights   02/13/15 0436 02/14/15 0205 02/15/15 0436  Weight: 75.025 kg (165 lb 6.4 oz) 78.608 kg  (173 lb 4.8 oz) 79.606 kg (175 lb 8 oz)    Exam:   General:  Mild respiratory distress.  Cardiovascular: S1-S2 normal. No murmurs. Pulse regular.  Respiratory: Good air entry bilaterally. Scant wheezing.  Abdomen: Soft and nontender. Normal bowel sounds. No organomegaly.  Musculoskeletal: No pedal edema   Neurological: Intact  Data Reviewed: Basic Metabolic Panel:  Recent Labs Lab 02/10/15 0407 02/11/15 0944 02/15/15 0541 02/16/15 0445  NA 140 143 142 147*  K 3.5 3.3* 4.0 3.5  CL 108 114* 111 115*  CO2 25 25 24 26   GLUCOSE 94 142* 80 85  BUN 25* 21 32* 36*  CREATININE 1.08 0.91 1.73* 1.68*  CALCIUM 7.9* 7.7* 8.6 8.7   Liver Function Tests:  Recent Labs Lab 02/10/15 0407  AST 23  ALT 15  ALKPHOS 85  BILITOT 1.0  PROT 4.5*  ALBUMIN 2.7*   No results for input(s): LIPASE, AMYLASE in the last 168 hours. No results for input(s): AMMONIA in the last 168 hours. CBC:  Recent Labs Lab 02/10/15 1730 02/13/15 0541 02/14/15 0905 02/15/15 0541 02/16/15 0445  WBC 2.9* 3.1* 3.0* 3.7* 3.5*  NEUTROABS  --   --  1.4* 1.4* 1.4*  HGB 11.1* 10.8* 10.0* 10.5* 9.8*  HCT 35.4* 34.0* 30.7* 32.1* 30.4*  MCV 88.1 86.1 86.0 86.1 85.4  PLT 33* 36* 40* 56* 59*   Cardiac Enzymes: No results for input(s): CKTOTAL, CKMB, CKMBINDEX, TROPONINI in the last 168 hours. BNP (last 3 results) No results for input(s): BNP in the last 8760 hours.  ProBNP (last 3 results) No results for input(s): PROBNP in the last 8760 hours.  CBG: No results for input(s): GLUCAP in the last 168 hours.  Recent Results (from the  past 240 hour(s))  Blood culture (routine x 2)     Status: None   Collection Time: 02/09/15 10:38 AM  Result Value Ref Range Status   Specimen Description BLOOD LEFT ANTECUBITAL  Final   Special Requests BOTTLES DRAWN AEROBIC AND ANAEROBIC 5ML  Final   Culture   Final    NO GROWTH 5 DAYS Performed at [facility]    Report Status 02/15/2015 FINAL  Final   Blood culture (routine x 2)     Status: None   Collection Time: 02/09/15 10:38 AM  Result Value Ref Range Status   Specimen Description BLOOD RIGHT ANTECUBITAL  Final   Special Requests BOTTLES DRAWN AEROBIC AND ANAEROBIC 4ML  Final   Culture   Final    NO GROWTH 5 DAYS Performed at [facility]    Report Status 02/15/2015 FINAL  Final  Urine culture     Status: None   Collection Time: 02/09/15 11:35 AM  Result Value Ref Range Status   Specimen Description URINE, CLEAN CATCH  Final   Special Requests NONE  Final   Colony Count   Final    >=100,000 COLONIES/ML Performed at [facility]    Culture   Final    ENTEROCOCCUS SPECIES Performed at [facility]    Report Status 02/11/2015 FINAL  Final   Organism ID, Bacteria ENTEROCOCCUS SPECIES  Final      Susceptibility   Enterococcus species - MIC*    AMPICILLIN <=2 SENSITIVE Sensitive     LEVOFLOXACIN 1 SENSITIVE Sensitive     NITROFURANTOIN <=16 SENSITIVE Sensitive     VANCOMYCIN 2 SENSITIVE Sensitive     TETRACYCLINE >=16 RESISTANT Resistant     * ENTEROCOCCUS SPECIES  Clostridium Difficile by PCR     Status: None   Collection Time: 02/11/15  6:50 PM  Result Value Ref Range Status   C difficile by pcr NEGATIVE NEGATIVE Final     Studies: Dg Chest Port 1 View  02/15/2015   CLINICAL DATA:  Cough and shortness of breath for several weeks, cough increasing tonight.  EXAM: PORTABLE CHEST - 1 VIEW  COMPARISON:  Frontal and lateral views 02/09/2015  FINDINGS: Development of diffuse bronchial thickening. The heart remains at the upper limits of normal, atherosclerosis of the aortic arch. Pulmonary vasculature is normal. No consolidation, pleural effusion, or pneumothorax. No acute osseous abnormalities are seen.  IMPRESSION: Diffuse bronchial thickening suggesting bronchitis or reactive airways disease.   Electronically Signed   By: Jeb Levering M.D.   On: 02/15/2015 22:55    Scheduled Meds: . buPROPion   200 mg Oral BID  . furosemide  40 mg Oral Daily  . guaiFENesin  1,200 mg Oral BID  . levofloxacin  750 mg Oral Q48H  . levothyroxine  25 mcg Oral QAC breakfast  . LORazepam  1 mg Oral QHS  . multivitamin with minerals  1 tablet Oral Daily  . pantoprazole  40 mg Oral Daily  . potassium chloride  40 mEq Oral Once  . predniSONE  40 mg Oral Daily  . pregabalin  150 mg Oral BID  . saccharomyces boulardii  250 mg Oral BID  . sodium chloride  3 mL Intravenous Q12H   Continuous Infusions: . sodium chloride 50 mL/hr at 02/16/15 0512     Time spent: 25 minutes    Ella Guillotte  [facility] Pager [phone]. If 7PM-7AM, please contact night-coverage at [email or web address], password [facility] 02/16/2015, 12:14 PM  LOS: 7 days

## 2015-02-16 NOTE — Progress Notes (Signed)
Vicki Bauer's bone marrow biopsy came back. The report, shows myelodysplasia. There is no evidence of lymphoma. Her CT scan is suggestive of lymphoma but there was nothing in the bone marrow biopsy. There were no monoclonal lymphocytes on flow cytometry.  There really is no intervention that we need to consider for her. She is not a candidate for any type of systemic chemotherapy. Sometimes we do use chemotherapy to help with myelodysplasia. Our hypo-methylating agents do have a success rate of about 20-25%. However, I just don't think that she would be a candidate because of her poor performance status.  Her platelet count is actually trending up toward. This is encouraging.  Her erythropoietin level is only 19.5. We'll avoid could consider Aranesp for her. However, I don't think we have to use that option right now.  There is more data regarding the use of Nplate in myelodysplasia and thrombocytopenia. This is one of our thrombopoietin agents which basically tries to get the bone marrow to make more platelets.  She's had no bleeding issues. I don't see any more bruises on her.  The rest her labs look pretty stable. Her creatinine is coming down a little bit. I think it bumped up somewhat because of her CT scan with contrast.  On her physical exam, all her vital signs are stable. Blood pressure 152/60. Temperature 97.7. Pulse is 85. Her lungs are clear. Cardiac exam regular rate and rhythm. She has an occasional 2 beat. She has 1/6 systolic murmur. Extremities shows the stasis dermatitis type changes. She has some chronic nonpitting edema of the lower legs. Abdomen is soft. Spleen tip my be palpable at the left costal margin.  Again, I think a bone marrow result is no surprise. The fact that she has myelodysplasia certainly goes along with the clinical picture. In addition, the fat that she had chemotherapy 15 years ago with certainly increase her risk of myelodysplasia. We do not have back the  chromosome analysis. It would not surprise me if she has abnormal, some complement.  I talked to her about all this. She understands what is going on.  We can certainly follow her up as now patient now. I can't see and he also needs to be done as an inpatient.  Pete E.  Romans 1:16

## 2015-02-17 DIAGNOSIS — R7989 Other specified abnormal findings of blood chemistry: Secondary | ICD-10-CM

## 2015-02-17 LAB — BASIC METABOLIC PANEL
ANION GAP: 9 (ref 5–15)
BUN: 40 mg/dL — ABNORMAL HIGH (ref 6–23)
CHLORIDE: 116 mmol/L — AB (ref 96–112)
CO2: 19 mmol/L (ref 19–32)
CREATININE: 1.53 mg/dL — AB (ref 0.50–1.10)
Calcium: 8.3 mg/dL — ABNORMAL LOW (ref 8.4–10.5)
GFR, EST AFRICAN AMERICAN: 35 mL/min — AB (ref >90)
GFR, EST NON AFRICAN AMERICAN: 30 mL/min — AB (ref >90)
Glucose, Bld: 115 mg/dL — ABNORMAL HIGH (ref 70–99)
Potassium: 4.2 mmol/L (ref 3.5–5.1)
Sodium: 144 mmol/L (ref 135–145)

## 2015-02-17 LAB — CBC WITH DIFFERENTIAL/PLATELET
BASOS ABS: 0 10*3/uL (ref 0.0–0.1)
BASOS PCT: 0 % (ref 0–1)
EOS ABS: 0 10*3/uL (ref 0.0–0.7)
EOS PCT: 0 % (ref 0–5)
HEMATOCRIT: 28.9 % — AB (ref 36.0–46.0)
HEMOGLOBIN: 9.5 g/dL — AB (ref 12.0–15.0)
Lymphocytes Relative: 45 % (ref 12–46)
Lymphs Abs: 1.3 10*3/uL (ref 0.7–4.0)
MCH: 27.7 pg (ref 26.0–34.0)
MCHC: 32.9 g/dL (ref 30.0–36.0)
MCV: 84.3 fL (ref 78.0–100.0)
MONO ABS: 0.3 10*3/uL (ref 0.1–1.0)
Monocytes Relative: 11 % (ref 3–12)
Neutro Abs: 1.3 10*3/uL — ABNORMAL LOW (ref 1.7–7.7)
Neutrophils Relative %: 44 % (ref 43–77)
Platelets: 78 10*3/uL — ABNORMAL LOW (ref 150–400)
RBC: 3.43 MIL/uL — ABNORMAL LOW (ref 3.87–5.11)
RDW: 16.3 % — AB (ref 11.5–15.5)
WBC: 3 10*3/uL — ABNORMAL LOW (ref 4.0–10.5)

## 2015-02-17 MED ORDER — LORAZEPAM 1 MG PO TABS: 1.0000 mg | ORAL_TABLET | Freq: Every day | ORAL | Status: AC

## 2015-02-17 MED ORDER — OXYCODONE HCL 10 MG PO TABS: 10.0000 mg | ORAL_TABLET | ORAL | Status: AC | PRN

## 2015-02-17 MED ORDER — HYDROCOD POLST-CHLORPHEN POLST 10-8 MG/5ML PO LQCR
5.0000 mL | Freq: Once | ORAL | Status: AC
  Administered 2015-02-17: 5 mL via ORAL
  Filled 2015-02-17: qty 5

## 2015-02-17 MED ORDER — ALBUTEROL SULFATE (2.5 MG/3ML) 0.083% IN NEBU: 2.5000 mg | INHALATION_SOLUTION | RESPIRATORY_TRACT | Status: AC | PRN

## 2015-02-17 MED ORDER — LEVOFLOXACIN 750 MG PO TABS: 750.0000 mg | ORAL_TABLET | ORAL | Status: DC

## 2015-02-17 MED ORDER — PREDNISONE 10 MG PO TABS: ORAL_TABLET | ORAL | Status: AC

## 2015-02-17 MED ORDER — PREGABALIN 75 MG PO CAPS: 75.0000 mg | ORAL_CAPSULE | Freq: Every day | ORAL | Status: AC

## 2015-02-17 MED ORDER — FUROSEMIDE 40 MG PO TABS: 20.0000 mg | ORAL_TABLET | Freq: Every day | ORAL | Status: AC

## 2015-02-17 MED ORDER — DARBEPOETIN ALFA 300 MCG/0.6ML IJ SOSY
300.0000 ug | PREFILLED_SYRINGE | Freq: Once | INTRAMUSCULAR | Status: AC
  Administered 2015-02-17: 300 ug via SUBCUTANEOUS
  Filled 2015-02-17: qty 0.6

## 2015-02-17 MED ORDER — GUAIFENESIN ER 600 MG PO TB12: 1200.0000 mg | ORAL_TABLET | Freq: Two times a day (BID) | ORAL | Status: AC

## 2015-02-17 NOTE — Clinical Social Work Placement (Signed)
Clinical Social Work Department CLINICAL SOCIAL WORK PLACEMENT NOTE 02/17/2015  Patient:  Vicki Bauer, Vicki Bauer  Account Number:  [id number] [place] date:  02/09/2015  Clinical Social Worker:  Dede Query, CLINICAL SOCIAL WORKER  Date/time:  02/17/2015 03:07 PM  Clinical Social Work is seeking post-discharge placement for this patient at the following level of care:   SKILLED NURSING   (*CSW will update this form in Epic as items are completed)   02/16/2015  Patient/family provided with [place] Department of Clinical Social Work's list of facilities offering this level of care within the geographic area requested by the patient (or if unable, by the patient's family).  02/16/2015  Patient/family informed of their freedom to choose among providers that offer the needed level of care, that participate in Medicare, Medicaid or managed care program needed by the patient, have an available bed and are willing to accept the patient.  02/16/2015  Patient/family informed of [facility]' ownership interest in [facility], as well as of the fact that they are under no obligation to receive care at this facility.  PASARR submitted to EDS on 02/16/2015 PASARR number received on 02/16/2015  FL2 transmitted to all facilities in geographic area requested by pt/family on  02/16/2015 FL2 transmitted to all facilities within larger geographic area on   Patient informed that his/her managed care company has contracts with or will negotiate with  certain facilities, including the following:     Patient/family informed of bed offers received:  02/16/2015 Patient chooses bed at [place] Physician recommends and patient chooses bed at    Patient to be transferred to [place] on  02/17/2015 Patient to be transferred to facility by ambulance Patient and family notified of transfer on 02/17/2015 Name of family member notified:  Thomas/son  The  following physician request were entered in Epic:   Additional Comments:  .Dede Query, [place] Worker - Weekend Coverage cell #: [phone]

## 2015-02-17 NOTE — Care Management Note (Signed)
Medicare Important Message given?  YES (If response is "NO", the following Medicare IM given date fields will be blank) Date Medicare IM given:  02/14/2015 Medicare IM given by:  Marney Doctor Date Additional Medicare IM given:  02/17/2015 Additional Medicare IM given by:  Alinda Sierras Kaelynn Igo

## 2015-02-17 NOTE — Progress Notes (Signed)
Physical Therapy Treatment Patient Details Name: Vicki Bauer MRN: [id number] DOB: 1928-01-11 Today's Date: 02/17/2015    History of Present Illness 81 yo female admitted with UTI, falls, LE cellulitis, sepsis. Hx of HTN, CAD    PT Comments    Assisted pt OOB to amb in hallway.  Pleasant and talkative.  Slightly unsteady with turns.  Decreased amb distance this session due to increased c/o fatigue/b LE weakness.   Follow Up Recommendations  SNF     Equipment Recommendations       Recommendations for Other Services       Precautions / Restrictions Precautions Precautions: Fall Precaution Comments: mac degen Restrictions Weight Bearing Restrictions: No    Mobility  Bed Mobility Overal bed mobility: Needs Assistance Bed Mobility: Sit to Supine     Supine to sit: Supervision     General bed mobility comments: increased time  Transfers Overall transfer level: Needs assistance Equipment used: Rolling walker (2 wheeled) Transfers: Sit to/from Stand Sit to Stand: Min guard;Min assist         General transfer comment: 25% VC's on safety with turns and hand placement for stand to sit for increased safety  Ambulation/Gait Ambulation/Gait assistance: Min guard;Min assist Ambulation Distance (Feet): 42 Feet Assistive device: Rolling walker (2 wheeled) Gait Pattern/deviations: Step-through pattern Gait velocity: decreased   General Gait Details: decreased amb distance this session due to increased c/o fatigue   Stairs            Wheelchair Mobility    Modified Rankin (Stroke Patients Only)       Balance                                    Cognition Arousal/Alertness: Awake/alert Behavior During Therapy: WFL for tasks assessed/performed Overall Cognitive Status: Within Functional Limits for tasks assessed                      Exercises      General Comments        Pertinent Vitals/Pain Pain Assessment: No/denies  pain    Home Living                      Prior Function            PT Goals (current goals can now be found in the care plan section) Progress towards PT goals: Progressing toward goals    Frequency  Min 3X/week    PT Plan      Co-evaluation             End of Session Equipment Utilized During Treatment: Gait belt Activity Tolerance: Patient tolerated treatment well Patient left: in chair;with call bell/phone within reach;with chair alarm set     Time: 1020-1045 PT Time Calculation (min) (ACUTE ONLY): 25 min  Charges:  $Gait Training: 8-22 mins $Therapeutic Activity: 8-22 mins                    G Codes:      Rica Koyanagi  PTA [facility]  Acute  Rehab Pager      [phone]

## 2015-02-17 NOTE — Progress Notes (Signed)
Vicki Bauer looks good this morning. She feels well.  I noticed that she does not have any teeth. This, I think, is a real issue. Apparently, her dentures are at home. I told her that it is important that she have her dentures so that she can eat better.  She's had no problem with bleeding or bruising.  Her platelet count continues to improve. Today, her platelet count is 78,000.  I think that the thrombocytopenia was exacerbated by the recent infection that she had. I told her that any stress on her body is going to cause her platelets to go down.  She's had no fever. She's had no nausea or vomiting.  On her physical exam, her vital signs are stable. Blood pressure 152/58. Temperature 97.6. Pulse 72. Head and neck exam shows no ocular or oral lesions. She has no palpable cervical or supraclavicular lymph nodes. Lungs are clear bilaterally. Cardiac exam regular rate and rhythm with no murmurs, rubs or bruits. Abdomen is soft. There is no palpable hepatomegaly. Spleen tip my be palpable at the left costal margin. Extremities shows the chronic nonpitting edema in the lower legs. She has some stasis dermatitis changes.  On her labs, her renal function is getting better. Creatinine is down to 1.53.  Hemoglobin is 9.5. White cell count 3.0.  Given that she has a low erythropoietin level, I think that she might benefit from Aranesp. I don't see a downside to doing Aranesp.  Hopefully, she would be able to go home soon. I suspect that she might have to go to skilled nursing first. What I will leave that decision up to the hospitalist.  I'm just glad that her platelet count is getting better. They basically has doubled in a matter of 4 days.   Lum Keas  Psalm 130:5

## 2015-02-17 NOTE — Discharge Summary (Signed)
EURA MCCAUSLIN, is a 81 y.o. female  DOB 15-Dec-1927  MRN [id number].  Admission date:  02/09/2015  Admitting Physician  Orson Eva, MD  Discharge Date:  02/17/2015   Primary MD  Lilian Coma, MD  Recommendations for primary care physician for things to follow:   Please follow renal function and CBC.   Consider referral to hematology/oncology as necessary.   Admission Diagnosis   Chronic diastolic heart failure [B34.35] UTI (lower urinary tract infection) [N39.0] Hypoxic [R09.02] Hypotension, unspecified hypotension type [I95.9] Urinary tract infection with hematuria, site unspecified [N39.0, R31.9]   Discharge Diagnosis  Chronic diastolic heart failure [W86.16] UTI (lower urinary tract infection) [N39.0] Hypoxic [R09.02] Hypotension, unspecified hypotension type [I95.9] Urinary tract infection with hematuria, site unspecified [N39.0, R31.9]   Principal Problem:   Thrombocytopenia Active Problems:   Pancytopenia   Dysphasia      Hospital Course  Aarthi Uyeno is a pleasant 81 year old female with essential hypertension, hypothyroidism, CAD, and obstructive sleep apnea, who presented with mechanical falls 2 on the morning of admission, and was found to have pancytopenia requiring bone marrow biopsy whose results suggested Myelodysplasia. Patient complained of cough on 02/15/15 prompting chest x-ray which showed "Diffuse bronchial thickening suggesting bronchitis or reactive airways disease". She also apparently had lower extremity cellulitis-not sure if petechiae may have contributed to their appearance of cellulitis. In any case, she was placed on antibiotics, bronchodilators, and systemic steroids with improvement in respiratory distress. She had some renal insufficiency and the creatinine is now  1.53. White count is 3, hemoglobin 9.5 and platelet count 78. Will continue Levaquin and add systemic steroids/bronchodilators for acute bronchitis. Dr Marin Olp saw patient in consult for pancytopenia and will be happy to follow in the outpatient setting as needed. He felt that pancytopenia was likely related to ongoing infection. Patient should complete 3 more doses of Levaquin and steroid taper. I have decreased her Lyrica dose and stopped Neurontin in view of renal insufficiency. She is being discharged to skilled nursing facility today.   Discharge Condition Stable.  Consults obtained  Oncology  Follow UP Oncology as needed   Discharge Instructions  and  Discharge Medications  Discharge Instructions    Diet - low sodium heart healthy    Complete by:  As directed      Increase activity slowly    Complete by:  As directed             Medication List    STOP taking these medications        ciprofloxacin 500 MG tablet  Commonly known as:  CIPRO     GRALISE 600 MG Tabs  Generic drug:  Gabapentin (Once-Daily)     zolpidem 5 MG tablet  Commonly known as:  AMBIEN      TAKE these medications        albuterol (2.5 MG/3ML) 0.083% nebulizer solution  Commonly known as:  PROVENTIL  Take 3 mLs (2.5 mg total) by nebulization every 2 (two) hours as needed for wheezing  or shortness of breath (first dose now please).     bisoprolol 5 MG tablet  Commonly known as:  ZEBETA  Take 5 mg by mouth daily.     buPROPion 200 MG 12 hr tablet  Commonly known as:  WELLBUTRIN SR  Take 200 mg by mouth 2 (two) times daily.     CENTRUM SILVER ADULT 50+ Tabs  Take 1 tablet by mouth daily.     DULoxetine 60 MG capsule  Commonly known as:  CYMBALTA  Take 60 mg by mouth 2 (two) times daily.     furosemide 40 MG tablet  Commonly known as:  LASIX  Take 0.5 tablets (20 mg total) by mouth daily.     GAS-X ULTRA STRENGTH 180 MG Caps  Generic drug:  Simethicone  Take 180 mg by mouth at bedtime.      guaiFENesin 600 MG 12 hr tablet  Commonly known as:  MUCINEX  Take 2 tablets (1,200 mg total) by mouth 2 (two) times daily.     levofloxacin 750 MG tablet  Commonly known as:  LEVAQUIN  Take 1 tablet (750 mg total) by mouth every other day.     levothyroxine 25 MCG tablet  Commonly known as:  SYNTHROID, LEVOTHROID  Take 25 mcg by mouth daily before breakfast.     LORazepam 1 MG tablet  Commonly known as:  ATIVAN  Take 1 tablet (1 mg total) by mouth at bedtime.     Oxycodone HCl 10 MG Tabs  Take 1 tablet (10 mg total) by mouth every 4 (four) hours as needed (pain).     predniSONE 10 MG tablet  Commonly known as:  DELTASONE  Please take 3 tablets daily for 3 days then 2 tablets daily for 2 days then 1 tablet daily for 2 days. Total 15 tablets     pregabalin 75 MG capsule  Commonly known as:  LYRICA  Take 1 capsule (75 mg total) by mouth daily.     Zinc 100 MG Tabs  Take 1 tablet by mouth every morning.        Diet and Activity recommendation: See Discharge Instructions above  Major procedures and Radiology Reports - PLEASE review detailed and final reports for all details, in brief -    Dg Chest 2 View  02/09/2015   CLINICAL DATA:  Patient fell earlier today.  History of lymphoma  EXAM: CHEST  2 VIEW  COMPARISON:  November 29, 2014  FINDINGS: There is no edema or consolidation. Heart is upper normal in size with pulmonary vascularity within normal limits. No adenopathy. There is atherosclerotic change in the aorta. No bone lesions. No pneumothorax.  IMPRESSION: No edema or consolidation.  No adenopathy.   Electronically Signed   By: Lowella Grip III M.D.   On: 02/09/2015 11:30   Ct Head Wo Contrast  02/09/2015   CLINICAL DATA:  Multiple falls. Patient with lethargy and difficulty with word-finding. Weakness.  EXAM: CT HEAD WITHOUT CONTRAST  TECHNIQUE: Contiguous axial images were obtained from the base of the skull through the vertex without intravenous contrast.   COMPARISON:  Brain MRI, 11/30/2014.  Head CT, 11/29/2014.  FINDINGS: Ventricles are normal in configuration. There is ventricular and sulcal enlargement reflecting mild diffuse atrophy. No hydrocephalus.  No parenchymal masses or mass effect. Patchy areas of white matter hypoattenuation noted consistent with moderate chronic microvascular ischemic change.  No evidence of a cortical infarct.  There are no extra-axial masses or abnormal fluid collections.  There is  no intracranial hemorrhage.  Visualized sinuses and mastoid air cells are clear.  No change from the prior studies.  IMPRESSION: 1. No acute intracranial abnormalities. 2. Mild atrophy and moderate chronic microvascular ischemic change, stable from the recent prior exams.   Electronically Signed   By: Lajean Manes M.D.   On: 02/09/2015 16:16   Ct Abdomen Pelvis W Contrast  02/14/2015   CLINICAL DATA:  Evaluate splenomegaly.  EXAM: CT ABDOMEN AND PELVIS WITH CONTRAST  TECHNIQUE: Multidetector CT imaging of the abdomen and pelvis was performed using the standard protocol following bolus administration of intravenous contrast.  CONTRAST:  145m OMNIPAQUE IOHEXOL 300 MG/ML  SOLN  COMPARISON:  None.  FINDINGS: Lower chest: The lung bases are clear of acute process. Minimal dependent bibasilar atelectasis. There are basilar scarring changes. No worrisome pulmonary lesions. The heart is upper limits of normal in size. No pericardial effusion. Coronary artery calcifications are noted. The distal esophagus is grossly normal.  Hepatobiliary: No focal hepatic lesions or intrahepatic biliary dilatation. The gallbladder is surgically absent. No common bile duct dilatation.  Pancreas: Diffuse fatty change involving the pancreatic head but no mass or acute inflammation.  Spleen: The spleen measures 13 x 11 x 10 cm. Splenic volume is 715 cubic cm. Small scattered splenic lesions are noted.  Adrenals/Urinary Tract: The adrenal glands and kidneys are unremarkable. A lower  pole left renal cyst is noted. No renal calculi or mass. No hydronephrosis. The bladder is grossly normal.  Stomach/Bowel: The stomach, duodenum, small bowel and colon are grossly normal. No inflammatory changes, mass lesions or obstructive findings. Moderate sigmoid diverticulosis without findings for acute diverticulitis  Vascular/Lymphatic: There are scattered borderline retroperitoneal lymph nodes an pelvic lymph nodes. Could not exclude an early lymphoproliferative process, vertically given the changes involving the spleen. Small scattered inguinal lymph nodes are also noted.  Reproductive: The uterus is surgically absent. Both ovaries are still present and appear normal.  Other: No abdominal hernia or subcutaneous lesions.  Musculoskeletal: Advanced degenerative changes involving the spine. There are bilateral pars defects at L4 with a grade 1-2 spondylolisthesis. Moderate scoliosis. No destructive bone lesions.  IMPRESSION: Splenomegaly with small scattered splenic lesions. This in conjunction with borderline retroperitoneal and pelvic lymph nodes could suggest a lymphoproliferative process such as lymphoma.   Electronically Signed   By: PMarijo SanesM.D.   On: 02/14/2015 12:24   [place]  02/10/2015   CLINICAL DATA:  Acute renal injury, hypertension  EXAM: RENAL/URINARY TRACT ULTRASOUND COMPLETE  COMPARISON:  None.  FINDINGS: Right Kidney:  Length: 10.5 cm. Echogenicity within normal limits. No mass or hydronephrosis visualized.  Left Kidney:  Length: 11.6 cm. Echogenicity within normal limits. No mass or hydronephrosis visualized.  Bladder:  Appears normal for degree of bladder distention.  The spleen is enlarged in size measuring 14 cm in greatest length. Splenic volume is calculated at 676 mL.  IMPRESSION: The kidneys are within normal limits bilaterally.  Changes consistent with splenomegaly.   Electronically Signed   By: MInez CatalinaM.D.   On: 02/10/2015 11:59   Ct Biopsy  02/13/2015   INDICATION:  Pancytopenia. Please perform CT guided bone marrow biopsy and aspiration for tissue diagnostic purposes.  EXAM: CT GUIDED BONE MARROW BIOPSY AND ASPIRATION  MEDICATIONS: Fentanyl 25 mcg IV; Versed 0.5 mg IV  ANESTHESIA/SEDATION: Sedation Time  5 minutes  CONTRAST:  None  COMPLICATIONS: None immediate.  PROCEDURE: Informed consent was obtained from the patient following an explanation of the procedure, risks,  benefits and alternatives. The patient understands, agrees and consents for the procedure. All questions were addressed. A time out was performed prior to the initiation of the procedure. The patient was positioned prone and non-contrast localization CT was performed of the pelvis to demonstrate the iliac marrow spaces. The operative site was prepped and draped in the usual sterile fashion.  Under sterile conditions and local anesthesia, a 22 gauge spinal needle was utilized for procedural planning. Next, an 11 gauge coaxial bone biopsy needle was advanced into the left iliac marrow space. Needle position was confirmed with CT imaging. Initially, bone marrow aspiration was performed. Next, a bone marrow biopsy was obtained with the 11 gauge outer bone marrow device. The 11 gauge coaxial bone biopsy needle was re-advanced into a slightly different location within the left iliac marrow space, positioning was confirmed and an additional bone marrow biopsy was obtained. Samples were prepared with the cytotechnologist and deemed adequate. The needle was removed intact. Hemostasis was obtained with compression and a dressing was placed. The patient tolerated the procedure well without immediate post procedural complication.  IMPRESSION: Successful CT guided left iliac bone marrow aspiration and core biopsies.   Electronically Signed   By: Sandi Mariscal M.D.   On: 02/13/2015 11:29   Dg Chest Port 1 View  02/15/2015   CLINICAL DATA:  Cough and shortness of breath for several weeks, cough increasing tonight.  EXAM:  PORTABLE CHEST - 1 VIEW  COMPARISON:  Frontal and lateral views 02/09/2015  FINDINGS: Development of diffuse bronchial thickening. The heart remains at the upper limits of normal, atherosclerosis of the aortic arch. Pulmonary vasculature is normal. No consolidation, pleural effusion, or pneumothorax. No acute osseous abnormalities are seen.  IMPRESSION: Diffuse bronchial thickening suggesting bronchitis or reactive airways disease.   Electronically Signed   By: Jeb Levering M.D.   On: 02/15/2015 22:55   Dg Hip Unilat With Pelvis 2-3 Views Right  02/09/2015   CLINICAL DATA:  Pt fell today at home in her bedroom. Pt c/o right hip pain. No known prior injury to hip. Per EMS, Pt tripped while getting out of bed and c/o being weaker then normal last few days. Denies neck and back pain. Sts she feels like she has a UTI.  EXAM: RIGHT HIP (WITH PELVIS) 2-3 VIEWS  COMPARISON:  None.  FINDINGS: No fracture or dislocation. Hip joints are normally spaced and aligned. There are no significant arthropathic changes.  Bony pelvis is intact.  Bones are diffusely demineralized.  Soft tissues are unremarkable.  IMPRESSION: No fracture or acute finding.   Electronically Signed   By: Lajean Manes M.D.   On: 02/09/2015 16:36    Micro Results   Recent Results (from the past 240 hour(s))  Blood culture (routine x 2)     Status: None   Collection Time: 02/09/15 10:38 AM  Result Value Ref Range Status   Specimen Description BLOOD LEFT ANTECUBITAL  Final   Special Requests BOTTLES DRAWN AEROBIC AND ANAEROBIC 5ML  Final   Culture   Final    NO GROWTH 5 DAYS Performed at [facility]    Report Status 02/15/2015 FINAL  Final  Blood culture (routine x 2)     Status: None   Collection Time: 02/09/15 10:38 AM  Result Value Ref Range Status   Specimen Description BLOOD RIGHT ANTECUBITAL  Final   Special Requests BOTTLES DRAWN AEROBIC AND ANAEROBIC 4ML  Final   Culture   Final    NO  GROWTH 5 DAYS Performed at  [facility]    Report Status 02/15/2015 FINAL  Final  Urine culture     Status: None   Collection Time: 02/09/15 11:35 AM  Result Value Ref Range Status   Specimen Description URINE, CLEAN CATCH  Final   Special Requests NONE  Final   Colony Count   Final    >=100,000 COLONIES/ML Performed at [facility]    Culture   Final    ENTEROCOCCUS SPECIES Performed at [facility]    Report Status 02/11/2015 FINAL  Final   Organism ID, Bacteria ENTEROCOCCUS SPECIES  Final      Susceptibility   Enterococcus species - MIC*    AMPICILLIN <=2 SENSITIVE Sensitive     LEVOFLOXACIN 1 SENSITIVE Sensitive     NITROFURANTOIN <=16 SENSITIVE Sensitive     VANCOMYCIN 2 SENSITIVE Sensitive     TETRACYCLINE >=16 RESISTANT Resistant     * ENTEROCOCCUS SPECIES  Clostridium Difficile by PCR     Status: None   Collection Time: 02/11/15  6:50 PM  Result Value Ref Range Status   C difficile by pcr NEGATIVE NEGATIVE Final       Today   Subjective:   Zerita Boers today has no headache,no chest abdominal pain,no new weakness tingling or numbness, feels much better wants to go home today.   Objective:   Blood pressure 144/46, pulse 67, temperature 98.6 F (37 C), temperature source Oral, resp. rate 16, height 5' 2"  (1.575 m), weight 76.93 kg (169 lb 9.6 oz), SpO2 97 %.   Intake/Output Summary (Last 24 hours) at 02/17/15 1400 Last data filed at 02/17/15 1338  Gross per 24 hour  Intake    720 ml  Output      0 ml  Net    720 ml    Exam Awake Alert, Oriented x 3, No new F.N deficits, Normal affect [place].AT,PERRAL Supple Neck,No JVD, No cervical lymphadenopathy appriciated.  Symmetrical Chest wall movement, Good air movement bilaterally, CTAB RRR,No Gallops,Rubs or new Murmurs, No Parasternal Heave +ve B.Sounds, Abd Soft, Non tender, No organomegaly appriciated, No rebound -guarding or rigidity. No Cyanosis, Clubbing or edema, some resolving petechia all  extremities.  Data Review   CBC w Diff: Lab Results  Component Value Date   WBC 3.0* 02/17/2015   HGB 9.5* 02/17/2015   HCT 28.9* 02/17/2015   HCT 40.9 02/09/2015   PLT 78* 02/17/2015   LYMPHOPCT 45 02/17/2015   MONOPCT 11 02/17/2015   EOSPCT 0 02/17/2015   BASOPCT 0 02/17/2015    CMP: Lab Results  Component Value Date   NA 144 02/17/2015   K 4.2 02/17/2015   CL 116* 02/17/2015   CO2 19 02/17/2015   BUN 40* 02/17/2015   CREATININE 1.53* 02/17/2015   PROT 4.5* 02/10/2015   ALBUMIN 2.7* 02/10/2015   BILITOT 1.0 02/10/2015   ALKPHOS 85 02/10/2015   AST 23 02/10/2015   ALT 15 02/10/2015  .   Total Time in preparing paper work, data evaluation and todays exam - 35 minutes  Geovany Trudo M.D on 02/17/2015 at 2:00 PM  [place]  [phone]

## 2015-02-20 ENCOUNTER — Encounter: Payer: Self-pay | Admitting: Hematology & Oncology

## 2015-02-20 LAB — CHROMOSOME ANALYSIS, BONE MARROW

## 2015-02-21 ENCOUNTER — Non-Acute Institutional Stay (SKILLED_NURSING_FACILITY): Payer: MEDICARE | Admitting: Internal Medicine

## 2015-02-21 DIAGNOSIS — K219 Gastro-esophageal reflux disease without esophagitis: Secondary | ICD-10-CM

## 2015-02-21 DIAGNOSIS — N182 Chronic kidney disease, stage 2 (mild): Secondary | ICD-10-CM | POA: Diagnosis not present

## 2015-02-21 DIAGNOSIS — D509 Iron deficiency anemia, unspecified: Secondary | ICD-10-CM

## 2015-02-21 DIAGNOSIS — J209 Acute bronchitis, unspecified: Secondary | ICD-10-CM | POA: Diagnosis not present

## 2015-02-21 DIAGNOSIS — D61818 Other pancytopenia: Secondary | ICD-10-CM | POA: Diagnosis not present

## 2015-02-21 DIAGNOSIS — I5032 Chronic diastolic (congestive) heart failure: Secondary | ICD-10-CM | POA: Diagnosis not present

## 2015-02-21 NOTE — Progress Notes (Signed)
MRN: [id number] Name: Vicki Bauer  Sex: female Age: 81 y.o. DOB: [birth year]  [place] #: [facility] Facility/Room:103 Level Of Care: SNF Provider: Inocencio Homes D Emergency Contacts: Extended Emergency Contact Information Primary Emergency Contact: Clayton,Thomas Address: [address]          [place], [place] [address] [place] Phone: [phone] Mobile Phone: [phone] Relation: Son Secondary Emergency Contact: [place] Work Phone: [phone] Relation: Son  Code Status: DNR  Allergies: Demerol; Morphine and related; and Macrolides and ketolides  Chief Complaint  Patient presents with  . New Admit To SNF    HPI: Patient is 81 y.o. female who is admitted to SNF after falls at home, tx for acute bronchits, and found to have pancytopenia, admitted to SNF for OT/PT.  Past Medical History  Diagnosis Date  . Hypertension   . Hypothyroidism   . Macular degeneration   . Cancer     lymphoma  . Insomnia   . OAB (overactive bladder)   . CAD (coronary artery disease) 1990    s/p stent x2,    Past Surgical History  Procedure Laterality Date  . Tonsillectomy and adenoidectomy    . Hysterotomy    . Replacement total knee bilateral    . Lymph gland excision Bilateral   . Cholecystectomy    . Cataract extraction Bilateral   . Appendectomy    . Ptca    . Back surgery        Medication List       This list is accurate as of: 02/21/15 11:59 PM.  Always use your most recent med list.               albuterol (2.5 MG/3ML) 0.083% nebulizer solution  Commonly known as:  PROVENTIL  Take 3 mLs (2.5 mg total) by nebulization every 2 (two) hours as needed for wheezing or shortness of breath (first dose now please).     bisoprolol 5 MG tablet  Commonly known as:  ZEBETA  Take 5 mg by mouth daily.     buPROPion 200 MG 12 hr tablet  Commonly known as:  WELLBUTRIN SR  Take 200 mg by  mouth 2 (two) times daily.     CENTRUM SILVER ADULT 50+ Tabs  Take 1 tablet by mouth daily.     DULoxetine 60 MG capsule  Commonly known as:  CYMBALTA  Take 60 mg by mouth 2 (two) times daily.     furosemide 40 MG tablet  Commonly known as:  LASIX  Take 0.5 tablets (20 mg total) by mouth daily.     GAS-X ULTRA STRENGTH 180 MG Caps  Generic drug:  Simethicone  Take 180 mg by mouth at bedtime.     guaiFENesin 600 MG 12 hr tablet  Commonly known as:  MUCINEX  Take 2 tablets (1,200 mg total) by mouth 2 (two) times daily.     levofloxacin 750 MG tablet  Commonly known as:  LEVAQUIN  Take 1 tablet (750 mg total) by mouth every other day.     levothyroxine 25 MCG tablet  Commonly known as:  SYNTHROID, LEVOTHROID  Take 25 mcg by mouth daily before breakfast.     LORazepam 1 MG tablet  Commonly known as:  ATIVAN  Take 1 tablet (1 mg total) by mouth at bedtime.     Oxycodone HCl 10 MG Tabs  Take 1 tablet (10  mg total) by mouth every 4 (four) hours as needed (pain).     predniSONE 10 MG tablet  Commonly known as:  DELTASONE  Please take 3 tablets daily for 3 days then 2 tablets daily for 2 days then 1 tablet daily for 2 days. Total 15 tablets     pregabalin 75 MG capsule  Commonly known as:  LYRICA  Take 1 capsule (75 mg total) by mouth daily.     Zinc 100 MG Tabs  Take 1 tablet by mouth every morning.        No orders of the defined types were placed in this encounter.     There is no immunization history on file for this patient.  History  Substance Use Topics  . Smoking status: Former Smoker -- 1.00 packs/day    Types: Cigarettes    Quit date: 12/09/1973  . Smokeless tobacco: Never Used  . Alcohol Use: No     Comment: quit: 1982    Family history is noncontributory    Review of Systems  DATA OBTAINED: from patient, nurse GENERAL:  no fevers, fatigue, appetite changes SKIN: No itching, rash or wounds EYES: No eye pain, redness, discharge EARS: No  earache, tinnitus, change in hearing NOSE: No congestion, drainage or bleeding  MOUTH/THROAT: No mouth or tooth pain, No sore throat RESPIRATORY: No cough, wheezing, SOB CARDIAC: No chest pain, palpitations, lower extremity edema  GI: No abdominal pain, No N/V/D or constipation, + heartburn   GU: No dysuria, frequency or urgency, or incontinence  MUSCULOSKELETAL: No unrelieved bone/joint pain NEUROLOGIC: No headache, dizziness or focal weakness PSYCHIATRIC: No overt anxiety or sadness, No behavior issue.   Filed Vitals:   02/21/15 1414  BP: 160/76  Pulse: 83  Temp: 97.2 F (36.2 C)  Resp: 18    Physical Exam  GENERAL APPEARANCE: Alert, conversant,WF  No acute distress.  SKIN: No diaphoresis rash HEAD: Normocephalic, atraumatic  EYES: Conjunctiva/lids clear. Pupils round, reactive. EOMs intact.  EARS: External exam WNL, canals clear. Hearing grossly normal.  NOSE: No deformity or discharge.  MOUTH/THROAT: Lips w/o lesions  RESPIRATORY: Breathing is even, unlabored. Lung sounds are without rales or wheezes.   CARDIOVASCULAR: Heart RRR no murmurs, rubs or gallops. No peripheral edema.   GASTROINTESTINAL: Abdomen is soft, non-tender, not distended w/ normal bowel sounds. GENITOURINARY: Bladder non tender, not distended  MUSCULOSKELETAL: No abnormal joints or musculature NEUROLOGIC:  Cranial nerves 2-12 grossly intact. Moves all extremities  PSYCHIATRIC: Mood and affect appropriate to situation, no behavioral issues  Patient Active Problem List   Diagnosis Date Noted  . Acute bronchitis 02/26/2015  . CKD (chronic kidney disease) stage 2, GFR 60-89 ml/min 02/26/2015  . Anemia, iron deficiency 02/26/2015  . Thrombocytopenia 02/10/2015  . Pancytopenia 02/09/2015  . Dysphasia 02/09/2015  . Chronic diastolic heart failure 16/09/9603  . CVA (cerebral infarction) 11/29/2014  . UTI (lower urinary tract infection) 11/29/2014    CBC    Component Value Date/Time   WBC 3.0*  02/17/2015 0509   RBC 3.43* 02/17/2015 0509   RBC 4.09 02/10/2015 1035   HGB 9.5* 02/17/2015 0509   HCT 28.9* 02/17/2015 0509   HCT 40.9 02/09/2015 1855   PLT 78* 02/17/2015 0509   MCV 84.3 02/17/2015 0509   LYMPHSABS 1.3 02/17/2015 0509   MONOABS 0.3 02/17/2015 0509   EOSABS 0.0 02/17/2015 0509   BASOSABS 0.0 02/17/2015 0509    CMP     Component Value Date/Time   NA 144 02/17/2015 0509  K 4.2 02/17/2015 0509   CL 116* 02/17/2015 0509   CO2 19 02/17/2015 0509   GLUCOSE 115* 02/17/2015 0509   BUN 40* 02/17/2015 0509   CREATININE 1.53* 02/17/2015 0509   CALCIUM 8.3* 02/17/2015 0509   PROT 4.5* 02/10/2015 0407   ALBUMIN 2.7* 02/10/2015 0407   AST 23 02/10/2015 0407   ALT 15 02/10/2015 0407   ALKPHOS 85 02/10/2015 0407   BILITOT 1.0 02/10/2015 0407   GFRNONAA 30* 02/17/2015 0509   GFRAA 35* 02/17/2015 0509    Assessment and Plan  Acute bronchitis Patient complained of cough on 02/15/15 prompting chest x-ray which showed "Diffuse bronchial thickening suggesting bronchitis or reactive airways disease".  In any case, she was placed on antibiotics, bronchodilators, and systemic steroids with improvement in respiratory distress. She had some renal insufficiency and the creatinine is now 1.53. White count is 3, hemoglobin 9.5 and platelet count 78. Will continue Levaquin and add systemic steroids/bronchodilators for acute bronchitis   Pancytopenia was found to have pancytopenia requiring bone marrow biopsy whose results suggested Myelodysplasia;WBC 3, Hb 9.5, PLT 78 Hem/Onc, Dr Marin Olp felt it was 2/2 to ongoing infection   CKD (chronic kidney disease) stage 2, GFR 60-89 ml/min GFR - 76, Cr at d/c was 1.53;lyfica was decreased and Neirontin stopped   Anemia, iron deficiency Hb 9.5, with <10 iron; need start FeSO4 325 daily   Chronic diastolic heart failure Pt on Bblocker and lasix;Plan - continue     Hennie Duos, MD

## 2015-02-26 ENCOUNTER — Non-Acute Institutional Stay (SKILLED_NURSING_FACILITY): Payer: MEDICARE | Admitting: Internal Medicine

## 2015-02-26 ENCOUNTER — Encounter: Payer: Self-pay | Admitting: Internal Medicine

## 2015-02-26 DIAGNOSIS — I5032 Chronic diastolic (congestive) heart failure: Secondary | ICD-10-CM

## 2015-02-26 DIAGNOSIS — J209 Acute bronchitis, unspecified: Secondary | ICD-10-CM | POA: Insufficient documentation

## 2015-02-26 DIAGNOSIS — D509 Iron deficiency anemia, unspecified: Secondary | ICD-10-CM | POA: Insufficient documentation

## 2015-02-26 DIAGNOSIS — K219 Gastro-esophageal reflux disease without esophagitis: Secondary | ICD-10-CM | POA: Insufficient documentation

## 2015-02-26 DIAGNOSIS — N182 Chronic kidney disease, stage 2 (mild): Secondary | ICD-10-CM | POA: Insufficient documentation

## 2015-02-26 NOTE — Assessment & Plan Note (Signed)
GFR - 76, Cr at d/c was 1.53;lyfica was decreased and Neirontin stopped

## 2015-02-26 NOTE — Assessment & Plan Note (Addendum)
Lungs are clear but increased LE swelling;Plan - increase Lasix to 40 mg daily, up from 20 mg and start K= 20 meq daily because of inc in lasix

## 2015-02-26 NOTE — Assessment & Plan Note (Addendum)
was found to have pancytopenia requiring bone marrow biopsy whose results suggested Myelodysplasia;WBC 3, Hb 9.5, PLT 78 Hem/Onc, Dr Marin Olp felt it was 2/2 to ongoing infection

## 2015-02-26 NOTE — Assessment & Plan Note (Addendum)
Hb 9.5, with <10 iron; need start FeSO4 325 daily

## 2015-02-26 NOTE — Assessment & Plan Note (Signed)
Pt on Bblocker and lasix;Plan - continue

## 2015-02-26 NOTE — Assessment & Plan Note (Signed)
Patient complained of cough on 02/15/15 prompting chest x-ray which showed "Diffuse bronchial thickening suggesting bronchitis or reactive airways disease".  In any case, she was placed on antibiotics, bronchodilators, and systemic steroids with improvement in respiratory distress. She had some renal insufficiency and the creatinine is now 1.53. White count is 3, hemoglobin 9.5 and platelet count 78. Will continue Levaquin and add systemic steroids/bronchodilators for acute bronchitis

## 2015-02-26 NOTE — Progress Notes (Signed)
MRN: [id number] Name: Vicki Bauer  Sex: female Age: 81 y.o. DOB: [birth year]  [place] #: [facility] Facility/Room:103 Level Of Care: SNF Provider: Inocencio Homes D Emergency Contacts: Extended Emergency Contact Information Primary Emergency Contact: Clayton,Thomas Address: [address]          [place], [place] [address] [place] Phone: [phone] Mobile Phone: [phone] Relation: Son Secondary Emergency Contact: [place] Work Phone: [phone] Relation: Son  Code Status:DNR   Allergies: Demerol; Morphine and related; and Macrolides and ketolides  Chief Complaint  Patient presents with  . Acute Visit    HPI: Patient is 81 y.o. female who nursing asked me to see for increased pedal edema.  Past Medical History  Diagnosis Date  . Hypertension   . Hypothyroidism   . Macular degeneration   . Cancer     lymphoma  . Insomnia   . OAB (overactive bladder)   . CAD (coronary artery disease) 1990    s/p stent x2,    Past Surgical History  Procedure Laterality Date  . Tonsillectomy and adenoidectomy    . Hysterotomy    . Replacement total knee bilateral    . Lymph gland excision Bilateral   . Cholecystectomy    . Cataract extraction Bilateral   . Appendectomy    . Ptca    . Back surgery        Medication List       This list is accurate as of: 02/26/15 11:43 AM.  Always use your most recent med list.               albuterol (2.5 MG/3ML) 0.083% nebulizer solution  Commonly known as:  PROVENTIL  Take 3 mLs (2.5 mg total) by nebulization every 2 (two) hours as needed for wheezing or shortness of breath (first dose now please).     bisoprolol 5 MG tablet  Commonly known as:  ZEBETA  Take 5 mg by mouth daily.     buPROPion 200 MG 12 hr tablet  Commonly known as:  WELLBUTRIN SR  Take 200 mg by mouth 2 (two) times daily.     CENTRUM SILVER ADULT 50+ Tabs  Take 1 tablet by  mouth daily.     DULoxetine 60 MG capsule  Commonly known as:  CYMBALTA  Take 60 mg by mouth 2 (two) times daily.     furosemide 40 MG tablet  Commonly known as:  LASIX  Take 0.5 tablets (20 mg total) by mouth daily.     GAS-X ULTRA STRENGTH 180 MG Caps  Generic drug:  Simethicone  Take 180 mg by mouth at bedtime.     guaiFENesin 600 MG 12 hr tablet  Commonly known as:  MUCINEX  Take 2 tablets (1,200 mg total) by mouth 2 (two) times daily.     levofloxacin 750 MG tablet  Commonly known as:  LEVAQUIN  Take 1 tablet (750 mg total) by mouth every other day.     levothyroxine 25 MCG tablet  Commonly known as:  SYNTHROID, LEVOTHROID  Take 25 mcg by mouth daily before breakfast.     LORazepam 1 MG tablet  Commonly known as:  ATIVAN  Take 1 tablet (1 mg total) by mouth at bedtime.     Oxycodone HCl 10 MG Tabs  Take 1 tablet (10 mg total) by mouth every 4 (four) hours as needed (pain).  predniSONE 10 MG tablet  Commonly known as:  DELTASONE  Please take 3 tablets daily for 3 days then 2 tablets daily for 2 days then 1 tablet daily for 2 days. Total 15 tablets     pregabalin 75 MG capsule  Commonly known as:  LYRICA  Take 1 capsule (75 mg total) by mouth daily.     Zinc 100 MG Tabs  Take 1 tablet by mouth every morning.        No orders of the defined types were placed in this encounter.     There is no immunization history on file for this patient.  History  Substance Use Topics  . Smoking status: Former Smoker -- 1.00 packs/day    Types: Cigarettes    Quit date: 12/09/1973  . Smokeless tobacco: Never Used  . Alcohol Use: No     Comment: quit: 1982    Review of Systems  DATA OBTAINED: from patient, nurse GENERAL:  no fevers, fatigue, appetite changes SKIN: No itching, rash HEENT: No complaint RESPIRATORY: No cough, wheezing, no SOB CARDIAC: No chest pain, palpitations+, lower extremity edema  GI: No abdominal pain, No N/V/D or constipation, No  heartburn or reflux  GU: No dysuria, frequency or urgency, or incontinence  MUSCULOSKELETAL: No unrelieved bone/joint pain NEUROLOGIC: No headache, dizziness  PSYCHIATRIC: No overt anxiety or sadness  Filed Vitals:   02/26/15 1142  BP: 139/78  Pulse: 60  Temp: 96.7 F (35.9 C)  Resp: 20    Physical Exam  GENERAL APPEARANCE: Alert, conversant, No acute distress  SKIN: No diaphoresis; Petechia around ankles seen prior, no heat, improved HEENT: Unremarkable RESPIRATORY: Breathing is even, unlabored. Lung sounds are clear   CARDIOVASCULAR: Heart RRR no murmurs, rubs or gallops. 2+ peripheral edema  GASTROINTESTINAL: Abdomen is soft, non-tender, not distended w/ normal bowel sounds.  GENITOURINARY: Bladder non tender, not distended  MUSCULOSKELETAL: No abnormal joints or musculature NEUROLOGIC: Cranial nerves 2-12 grossly intact. Moves all extremities PSYCHIATRIC: Mood and affect appropriate to situation, no behavioral issues  Patient Active Problem List   Diagnosis Date Noted  . Acute bronchitis 02/26/2015  . CKD (chronic kidney disease) stage 2, GFR 60-89 ml/min 02/26/2015  . Anemia, iron deficiency 02/26/2015  . GERD (gastroesophageal reflux disease) 02/26/2015  . Thrombocytopenia 02/10/2015  . Pancytopenia 02/09/2015  . Dysphasia 02/09/2015  . Chronic diastolic heart failure 96/22/2979  . CVA (cerebral infarction) 11/29/2014  . UTI (lower urinary tract infection) 11/29/2014    CBC    Component Value Date/Time   WBC 3.0* 02/17/2015 0509   RBC 3.43* 02/17/2015 0509   RBC 4.09 02/10/2015 1035   HGB 9.5* 02/17/2015 0509   HCT 28.9* 02/17/2015 0509   HCT 40.9 02/09/2015 1855   PLT 78* 02/17/2015 0509   MCV 84.3 02/17/2015 0509   LYMPHSABS 1.3 02/17/2015 0509   MONOABS 0.3 02/17/2015 0509   EOSABS 0.0 02/17/2015 0509   BASOSABS 0.0 02/17/2015 0509    CMP     Component Value Date/Time   NA 144 02/17/2015 0509   K 4.2 02/17/2015 0509   CL 116* 02/17/2015 0509    CO2 19 02/17/2015 0509   GLUCOSE 115* 02/17/2015 0509   BUN 40* 02/17/2015 0509   CREATININE 1.53* 02/17/2015 0509   CALCIUM 8.3* 02/17/2015 0509   PROT 4.5* 02/10/2015 0407   ALBUMIN 2.7* 02/10/2015 0407   AST 23 02/10/2015 0407   ALT 15 02/10/2015 0407   ALKPHOS 85 02/10/2015 0407   BILITOT 1.0 02/10/2015 0407  GFRNONAA 30* 02/17/2015 0509   GFRAA 35* 02/17/2015 0509    Assessment and Plan  No problem-specific assessment & plan notes found for this encounter.  Pt seen 02/24/2015 Hennie Duos, MD

## 2015-02-26 NOTE — Assessment & Plan Note (Signed)
Probably 2/2 prednisone taper;start Zantac 300 mg and treat for a week;extend if needed

## 2015-02-27 ENCOUNTER — Non-Acute Institutional Stay (SKILLED_NURSING_FACILITY): Payer: MEDICARE | Admitting: Internal Medicine

## 2015-02-27 DIAGNOSIS — D61818 Other pancytopenia: Secondary | ICD-10-CM

## 2015-02-27 DIAGNOSIS — I5032 Chronic diastolic (congestive) heart failure: Secondary | ICD-10-CM | POA: Diagnosis not present

## 2015-02-27 DIAGNOSIS — L03116 Cellulitis of left lower limb: Secondary | ICD-10-CM | POA: Diagnosis not present

## 2015-02-27 DIAGNOSIS — N182 Chronic kidney disease, stage 2 (mild): Secondary | ICD-10-CM | POA: Diagnosis not present

## 2015-02-27 NOTE — Progress Notes (Signed)
Patient ID: Vicki Bauer, female   DOB: [birth year], 81 y.o.   MRN: [id number]   this is an acute visit.  Level care skilled.  [facility].    Chief Complaint  Patient presents with  .  acute visit secondary to petechia     HPI: Patient is 81 y.o. female who is admitted to SNF after falls at home, tx for acute bronchits, and found to have pancytopenia, admitted to SNF for OT/PT Her stay here has been fairly unremarkable-Dr. Sheppard Coil did see her last week for some increased pedal edema her Lasix was increased to 40 mg a day-.  She does not complain of any chest pain or shortness of breath.  Nursing staff has noted what appears to be some petechia on her thighs bilaterally which is new. She has had some petechia on her legs-he is also been treated recently for cellulitis of her lower extremities-she was evaluated by hematology and was thought petechia may be related to the acute infection  She has been afebrile  .  Past Medical History  Diagnosis Date  . Hypertension   . Hypothyroidism   . Macular degeneration   . Cancer     lymphoma  . Insomnia   . OAB (overactive bladder)   . CAD (coronary artery disease) 1990    s/p stent x2,    Past Surgical History  Procedure Laterality Date  . Tonsillectomy and adenoidectomy    . Hysterotomy    . Replacement total knee bilateral    . Lymph gland excision Bilateral   . Cholecystectomy    . Cataract extraction Bilateral   . Appendectomy    . Ptca    . Back surgery        Medication List        .               albuterol (2.5 MG/3ML) 0.083% nebulizer solution  Commonly known as:  PROVENTIL  Take 3 mLs (2.5 mg total) by nebulization every 2 (two) hours as needed for wheezing or shortness of breath (first dose now please).     bisoprolol 5 MG tablet  Commonly known as:  ZEBETA  Take 5 mg by mouth daily.     buPROPion 200 MG 12 hr tablet  Commonly known as:  WELLBUTRIN SR  Take 200 mg by mouth 2 (two)  times daily.     CENTRUM SILVER ADULT 50+ Tabs  Take 1 tablet by mouth daily.     DULoxetine 60 MG capsule  Commonly known as:  CYMBALTA  Take 60 mg by mouth 2 (two) times daily.     furosemide 40 MG tablet  Commonly known as:  LASIX  Take 40 mg total) by mouth daily.     GAS-X ULTRA STRENGTH 180 MG Caps  Generic drug:  Simethicone  Take 180 mg by mouth at bedtime.     guaiFENesin 600 MG 12 hr tablet  Commonly known as:  MUCINEX  Take 2 tablets (1,200 mg total) by mouth 2 (two) times daily.     levofloxacin 750 MG tablet  Commonly known as:  LEVAQUIN  Take 1 tablet (750 mg total) by mouth every other day.     levothyroxine 25 MCG tablet  Commonly known as:  SYNTHROID, LEVOTHROID  Take 25 mcg by mouth daily before breakfast.     LORazepam 1 MG tablet  Commonly known as:  ATIVAN  Take 1 tablet (1 mg total) by mouth at bedtime.  Oxycodone HCl 10 MG Tabs  Take 1 tablet (10 mg total) by mouth every 4 (four) hours as needed (pain).               pregabalin 75 MG capsule  Commonly known as:  LYRICA  Take 1 capsule (75 mg total) by mouth daily.     Zinc 100 MG Tabs  Take 1 tablet by mouth every morning.             There is no immunization history on file for this patient.  History  Substance Use Topics  . Smoking status: Former Smoker -- 1.00 packs/day    Types: Cigarettes    Quit date: 12/09/1973  . Smokeless tobacco: Never Used  . Alcohol Use: No     Comment: quit: 1982    Family history is noncontributory    Review of Systems  DATA OBTAINED: from patient, nurse GENERAL:  no fevers, fatigue, appetite changes SKIN: No itching, has what appears to be some increased bruising petechia of her thighs bilaterally EYES: No eye pain, redness, discharge EARS: No earache, tinnitus, change in hearing NOSE: No congestion, drainage or bleeding  MOUTH/THROAT: No mouth or tooth pain, No sore throat RESPIRATORY: No cough, wheezing, SOB CARDIAC: No chest  pain, palpitations,  baseline lower extremity edema  GI: No abdominal pain, No N/V/D or constipation, + heartburn   GU: No dysuria, frequency or urgency, or incontinence  MUSCULOSKELETAL: No unrelieved bone/joint pain NEUROLOGIC: No headache, dizziness or focal weakness PSYCHIATRIC: No overt anxiety or sadness, No behavior issue.                       Physical Exam Dr. 97.1 pulse 76 respirations 18 blood pressure taken manually 140/60 see variable systolics ranging from the 120s-170s GENERAL APPEARANCE: Alert, conversant,WF  No acute distress.  SKIN: No diaphoresis --has some splotchy appearing petechia/r bruising of her upper thighs bilaterally I also note the back of her lower left leg--inferior to the knee- there is an area of erythema that is warm to touch there is a smaller area on the right lower leg HEAD: Normocephalic, atraumatic  EYES: Conjunctiva/lids clear. Pupils round, reactive. EOMs intact.  EARS: External exam WNL, canals clear. Hearing grossly normal.  NOSE: No deformity or discharge.  MOUTH/THROAT: Lips w/o lesions  RESPIRATORY: Breathing is even, unlabored. Lung sounds are without rales or wheezes.   CARDIOVASCULAR: Heart RRR no murmurs, rubs or gallops.1 plus  peripheral edema.   GASTROINTESTINAL: Abdomen is soft, non-tender, not distended w/ normal bowel sounds.   MUSCULOSKELETAL: No abnormal joints or musculature--he sees skin section NEUROLOGIC:  Cranial nerves 2-12 grossly intact. Moves all extremities  PSYCHIATRIC: Mood and affect appropriate to situation, no behavioral issues  Patient Active Problem List   Diagnosis Date Noted  . Acute bronchitis 02/26/2015  . CKD (chronic kidney disease) stage 2, GFR 60-89 ml/min 02/26/2015  . Anemia, iron deficiency 02/26/2015  . Thrombocytopenia 02/10/2015  . Pancytopenia 02/09/2015  . Dysphasia 02/09/2015  . Chronic diastolic heart failure 85/27/7824  . CVA (cerebral infarction) 11/29/2014  . UTI (lower urinary  tract infection) 11/29/2014    CBC    Component Value Date/Time   WBC 3.0* 02/17/2015 0509   RBC 3.43* 02/17/2015 0509   RBC 4.09 02/10/2015 1035   HGB 9.5* 02/17/2015 0509   HCT 28.9* 02/17/2015 0509   HCT 40.9 02/09/2015 1855   PLT 78* 02/17/2015 0509   MCV 84.3 02/17/2015 0509   LYMPHSABS 1.3  02/17/2015 0509   MONOABS 0.3 02/17/2015 0509   EOSABS 0.0 02/17/2015 0509   BASOSABS 0.0 02/17/2015 0509    CMP     Component Value Date/Time   NA 144 02/17/2015 0509   K 4.2 02/17/2015 0509   CL 116* 02/17/2015 0509   CO2 19 02/17/2015 0509   GLUCOSE 115* 02/17/2015 0509   BUN 40* 02/17/2015 0509   CREATININE 1.53* 02/17/2015 0509   CALCIUM 8.3* 02/17/2015 0509   PROT 4.5* 02/10/2015 0407   ALBUMIN 2.7* 02/10/2015 0407   AST 23 02/10/2015 0407   ALT 15 02/10/2015 0407   ALKPHOS 85 02/10/2015 0407   BILITOT 1.0 02/10/2015 0407   GFRNONAA 30* 02/17/2015 0509   GFRAA 35* 02/17/2015 0509    Assessment and Plan  Acute bronchitis Patient complained of cough on 02/15/15 prompting chest x-ray which showed "Diffuse bronchial thickening suggesting bronchitis or reactive airways disease".  In any case, she was placed on antibiotics, bronchodilators, and systemic steroids with improvement in respiratory distress. She had some renal insufficiency and the creatinine most recently 1.53. White count is 3, hemoglobin 9.5 and platelet count 78. Update this   Pancytopenia was found to have pancytopenia requiring bone marrow biopsy whose results suggested Myelodysplasia;WBC 3, Hb 9.5, PLT 78 Hem/Onc, Dr Marin Olp felt it was 2/2 to ongoing infection--apparently petechia has returned now on the thigh area-will check a CBC with platelets for follow-up   possible cellulitis especially left lower leg-will start Levaquin 750 mg every other day for 5 doses-apparently what was thought to be lower extremity cellulitis resolved previously--although this will have to be monitored   CKD (chronic kidney  disease) stage 2, GFR 60-89 ml/min GFR - 76, Cr at d/c was 1.53;lyfica was decreased and Neirontin stopped--will update basic metabolic panel   Anemia, iron deficiency Hb 9.5, with <10 iron; ont FeSO4 325 daily   Chronic diastolic heart failure Pt on Bblocker and lasix;-Lasix was increased recently secondary to concerns of increased lower extremity edema-Will start potassium as well--we will give 40 mEq tonight and 20 mEq thereafter do a stat BMP tomorrow make sure electrolytes and renal function are stable  [id number]

## 2015-03-02 ENCOUNTER — Encounter: Payer: Self-pay | Admitting: Internal Medicine

## 2015-03-02 ENCOUNTER — Non-Acute Institutional Stay (SKILLED_NURSING_FACILITY): Payer: MEDICARE | Admitting: Internal Medicine

## 2015-03-02 DIAGNOSIS — I5032 Chronic diastolic (congestive) heart failure: Secondary | ICD-10-CM | POA: Diagnosis not present

## 2015-03-02 DIAGNOSIS — N182 Chronic kidney disease, stage 2 (mild): Secondary | ICD-10-CM

## 2015-03-02 DIAGNOSIS — L03116 Cellulitis of left lower limb: Secondary | ICD-10-CM | POA: Diagnosis not present

## 2015-03-02 DIAGNOSIS — D61818 Other pancytopenia: Secondary | ICD-10-CM | POA: Diagnosis not present

## 2015-03-02 NOTE — Progress Notes (Signed)
Patient ID: Vicki Bauer, female   DOB: May 08, 1928, 81 y.o.   MRN: [id number]    this is an acute visit.  Level care skilled.  [facility].    Chief Complaint  Patient presents with  .  acute visit secondary to cellulitis-petechia-family concerns     HPI: Patient is 81 y.o. female who is admitted to SNF after falls at home, tx for acute bronchits, and found to have pancytopenia, admitted to SNF for OT/PT Her stay here has been fairly unremarkable-Dr. Sheppard Coil did see her last week for some increased pedal edema her Lasix was increased to 40 mg a day-.  She does not complain of any chest pain or shortness of breath.  Nursing staff  noted what appears to be some petechia on her thighs bilaterally which was new. She has had some petechia on her legs-she was evaluated by hematology in the hospital and was thought petechia may be related to the acute infection This apparently got better however earlier this week she appeared to have some increase petechia splotchy areas on her upper thighs bilaterally.  She also had some erythema on the upper part of her lower left leg.  She was started on Levaquin earlier this week and this appears to be improving-the areas on her upper thighs appear to be paler  gradually resolving-the area of erythema on the left leg also appears to be less inflammatory appearing with less erythema and less warmth  Per nursing staff she is getting stronger is ambulating with walker to the bathroom-appears to be doing well.  I had an extensive discussion with her son in the room today-he was concerned with a history of petechia-I do note lab done this week shows her platelets have improved from 78,002 246,000 which is encouraging her hemoglobin is 9.5 Dr. Sheppard Coil did start her on iron earlier this week.  Her son is concerned that this may reoccur-and we will order a hematology consult-per review of notes hematologist did state he would follow-up as  needed.    .  Past Medical History  Diagnosis Date  . Hypertension   . Hypothyroidism   . Macular degeneration   . Cancer     lymphoma  . Insomnia   . OAB (overactive bladder)   . CAD (coronary artery disease) 1990    s/p stent x2,    Past Surgical History  Procedure Laterality Date  . Tonsillectomy and adenoidectomy    . Hysterotomy    . Replacement total knee bilateral    . Lymph gland excision Bilateral   . Cholecystectomy    . Cataract extraction Bilateral   . Appendectomy    . Ptca    . Back surgery        Medication List        .               albuterol (2.5 MG/3ML) 0.083% nebulizer solution  Commonly known as:  PROVENTIL  Take 3 mLs (2.5 mg total) by nebulization every 2 (two) hours as needed for wheezing or shortness of breath (first dose now please).     bisoprolol 5 MG tablet  Commonly known as:  ZEBETA  Take 5 mg by mouth daily.     buPROPion 200 MG 12 hr tablet  Commonly known as:  WELLBUTRIN SR  Take 200 mg by mouth 2 (two) times daily.     CENTRUM SILVER ADULT 50+ Tabs  Take 1 tablet by mouth daily.  DULoxetine 60 MG capsule  Commonly known as:  CYMBALTA  Take 60 mg by mouth 2 (two) times daily.     furosemide 40 MG tablet  Commonly known as:  LASIX  Take 40 mg total) by mouth daily.     GAS-X ULTRA STRENGTH 180 MG Caps  Generic drug:  Simethicone  Take 180 mg by mouth at bedtime.     guaiFENesin 600 MG 12 hr tablet  Commonly known as:  MUCINEX  Take 2 tablets (1,200 mg total) by mouth 2 (two) times daily.     levofloxacin 750 MG tablet  Commonly known as:  LEVAQUIN  Take 1 tablet (750 mg total) by mouth every other day.     levothyroxine 25 MCG tablet  Commonly known as:  SYNTHROID, LEVOTHROID  Take 25 mcg by mouth daily before breakfast.     LORazepam 1 MG tablet  Commonly known as:  ATIVAN  Take 1 tablet (1 mg total) by mouth at bedtime.     Oxycodone HCl 10 MG Tabs  Take 1 tablet (10 mg total) by mouth every 4  (four) hours as needed (pain).      Potassium 20 mEq daily          pregabalin 75 MG capsule  Commonly known as:  LYRICA  Take 1 capsule (75 mg total) by mouth daily.     Zinc 100 MG Tabs  Take 1 tablet by mouth every morning.             There is no immunization history on file for this patient.  History  Substance Use Topics  . Smoking status: Former Smoker -- 1.00 packs/day    Types: Cigarettes    Quit date: 12/09/1973  . Smokeless tobacco: Never Used  . Alcohol Use: No     Comment: quit: 1982    Family history is noncontributory    Review of Systems  DATA OBTAINED: from patient, nurse GENERAL:  no fevers, fatigue, appetite changes SKIN: No itching, l cellulitis-petechia on lower extremities appears to be improving EYES: No eye pain, redness, discharge EARS: No earache, tinnitus, her son patient has had a gradual decline in hearing NOSE: No congestion, drainage or bleeding  MOUTH/THROAT: No mouth or tooth pain, No sore throat RESPIRATORY: No cough, wheezing, SOB CARDIAC: No chest pain, palpitations,  baseline lower extremity edema  GI: No abdominal pain, No N/V/D or constipation,   GU: No dysuria, frequency or urgency, or incontinence--apparently a urine culture is pending  MUSCULOSKELETAL: No unrelieved bone/joint pain--per nursing staff has gained strength NEUROLOGIC: No headache, dizziness or focal weakness PSYCHIATRIC: No overt anxiety or sadness, No behavior issue.                       Physical Exam  Temperature 97.2 pulse 60 respirations 18 blood pressure 139/65 O2 sats ration is 94% on room air  GENERAL APPEARANCE: Alert, conversant,WF  No acute distress.  SKIN: No diaphoresis --has some splotchy appearing petechia/r bruising of her upper thighs bilaterally that appear less remarkable appear to be fading gradually--inferior to the knee-erythema appears to be less inflamed appearing less warm HEAD: Normocephalic, atraumatic  EYES:  Conjunctiva/lids clear. Pupils round, reactive. EOMs intact.  EARS: External exam WNL, canals clear. Hearing grossly normal.  NOSE: No deformity or discharge.  MOUTH/THROAT: Lips w/o lesions oropharynx is clear mucous membranes moist  RESPIRATORY: Breathing is even, unlabored. Lung sounds are without rales or wheezes.   CARDIOVASCULAR: Heart RRR no  murmurs, rubs or gallops.1 plus  peripheral edema--per nursing staff improves when she has her legs in a elevated position.   GASTROINTESTINAL: Abdomen is soft, non-tender, not distended w/ normal bowel sounds.   MUSCULOSKELETAL: No abnormal joints or musculature--she stands without assistance and ambulates with her walker NEUROLOGIC:  Cranial nerves 2-12 grossly intact. Moves all extremities  PSYCHIATRIC: Mood and affect appropriate to situation, no behavioral issues  Patient Active Problem List   Diagnosis Date Noted  . Acute bronchitis 02/26/2015  . CKD (chronic kidney disease) stage 2, GFR 60-89 ml/min 02/26/2015  . Anemia, iron deficiency 02/26/2015  . Thrombocytopenia 02/10/2015  . Pancytopenia 02/09/2015  . Dysphasia 02/09/2015  . Chronic diastolic heart failure 35/00/9381  . CVA (cerebral infarction) 11/29/2014  . UTI (lower urinary tract infection) 11/29/2014     Labs.  02/27/2015.  WBC 4.1 hemoglobin 9.5 platelets 246.  Sodium 144 potassium 3.4 BUN 18 creatinine 1.06 CBC    Component Value Date/Time   WBC 3.0* 02/17/2015 0509   RBC 3.43* 02/17/2015 0509   RBC 4.09 02/10/2015 1035   HGB 9.5* 02/17/2015 0509   HCT 28.9* 02/17/2015 0509   HCT 40.9 02/09/2015 1855   PLT 78* 02/17/2015 0509   MCV 84.3 02/17/2015 0509   LYMPHSABS 1.3 02/17/2015 0509   MONOABS 0.3 02/17/2015 0509   EOSABS 0.0 02/17/2015 0509   BASOSABS 0.0 02/17/2015 0509    CMP     Component Value Date/Time   NA 144 02/17/2015 0509   K 4.2 02/17/2015 0509   CL 116* 02/17/2015 0509   CO2 19 02/17/2015 0509   GLUCOSE 115* 02/17/2015 0509   BUN  40* 02/17/2015 0509   CREATININE 1.53* 02/17/2015 0509   CALCIUM 8.3* 02/17/2015 0509   PROT 4.5* 02/10/2015 0407   ALBUMIN 2.7* 02/10/2015 0407   AST 23 02/10/2015 0407   ALT 15 02/10/2015 0407   ALKPHOS 85 02/10/2015 0407   BILITOT 1.0 02/10/2015 0407   GFRNONAA 30* 02/17/2015 0509   GFRAA 35* 02/17/2015 0509    Assessment and Plan  Acute bronchitis Patient complained of cough on 02/15/15 prompting chest x-ray which showed "Diffuse bronchial thickening suggesting bronchitis or reactive airways disease".  In any case, she was placed on antibiotics, bronchodilators, and systemic steroids with improvement in respiratory distress  She did have some renal insufficiency with a discharge from hospital creatinine apparently of 1.53 on most recent lab this has improved to 1.06 with a BUN of 18 this was on the lab done on 02/27/2015.    Pancytopenia was found to have pancytopenia requiring bone marrow biopsy whose results suggested Myelodysplasia;WBC 3, Hb 9.5, PLT 78 Hem/Onc, Dr Marin Olp felt it was 2/2 to ongoing infection-- Platelets on lab done on March 21 showed significant improvement at 246,000 Will order an hematology consult secondary family request with history of thrombocytopenia pancytopenia and petechia Will update a CBC next week   possible cellulitis especially left lower leg-she is on a course of Levaquin 750 mg every other day-this appears to be helping at this point continue to monitor    CKD (chronic kidney disease) stage 2, GFR 60-89 ml/min GFR - 76, Cr at d/c was 1.53;lyfica was decreased and Neirontin stopped--metabolic panel done on March 21 shows improvement with a creatinine of 1.06 and BUN of 18   Anemia, iron deficiency Hb 9.5, with <10 iron; on FeSO4 325 daily--   Chronic diastolic heart failure Pt on Bblocker and lasix;-Lasix was increased recently secondary to concerns of increased lower  extremity edema-she has been started on potassium as well .  Will  update a metabolic panel tomorrow as well as next week to ensure renal and electrolyte stability    Patient with gradual loss of hearing-will order an audiology consult per family request.  [id number] note greater than 40 minutes spent assessing patient-reviewing her chart-discussing her status with nursing-as well as discussion with her son responsible party in the room at bedside Of note greater than 50% of time spent coordinating plan of clear with family input

## 2015-03-05 ENCOUNTER — Emergency Department (HOSPITAL_COMMUNITY)
Admission: EM | Admit: 2015-03-05 | Discharge: 2015-03-05 | Disposition: A | Discharge status: Home / Self Care | Payer: MEDICARE | Attending: Emergency Medicine | Admitting: Emergency Medicine

## 2015-03-05 ENCOUNTER — Encounter (HOSPITAL_COMMUNITY): Payer: Self-pay | Admitting: Emergency Medicine

## 2015-03-05 ENCOUNTER — Emergency Department (HOSPITAL_COMMUNITY): Payer: MEDICARE

## 2015-03-05 DIAGNOSIS — S0990XA Unspecified injury of head, initial encounter: Secondary | ICD-10-CM | POA: Diagnosis present

## 2015-03-05 DIAGNOSIS — E039 Hypothyroidism, unspecified: Secondary | ICD-10-CM | POA: Insufficient documentation

## 2015-03-05 DIAGNOSIS — Y9289 Other specified places as the place of occurrence of the external cause: Secondary | ICD-10-CM | POA: Insufficient documentation

## 2015-03-05 DIAGNOSIS — Z7952 Long term (current) use of systemic steroids: Secondary | ICD-10-CM | POA: Diagnosis not present

## 2015-03-05 DIAGNOSIS — Y998 Other external cause status: Secondary | ICD-10-CM | POA: Diagnosis not present

## 2015-03-05 DIAGNOSIS — S0101XA Laceration without foreign body of scalp, initial encounter: Secondary | ICD-10-CM | POA: Diagnosis not present

## 2015-03-05 DIAGNOSIS — Z8572 Personal history of non-Hodgkin lymphomas: Secondary | ICD-10-CM | POA: Diagnosis not present

## 2015-03-05 DIAGNOSIS — I10 Essential (primary) hypertension: Secondary | ICD-10-CM | POA: Insufficient documentation

## 2015-03-05 DIAGNOSIS — W19XXXA Unspecified fall, initial encounter: Secondary | ICD-10-CM

## 2015-03-05 DIAGNOSIS — Y9301 Activity, walking, marching and hiking: Secondary | ICD-10-CM | POA: Diagnosis not present

## 2015-03-05 DIAGNOSIS — W228XXA Striking against or struck by other objects, initial encounter: Secondary | ICD-10-CM | POA: Diagnosis not present

## 2015-03-05 DIAGNOSIS — I2510 Atherosclerotic heart disease of native coronary artery without angina pectoris: Secondary | ICD-10-CM | POA: Diagnosis not present

## 2015-03-05 DIAGNOSIS — Z79899 Other long term (current) drug therapy: Secondary | ICD-10-CM | POA: Diagnosis not present

## 2015-03-05 DIAGNOSIS — F0390 Unspecified dementia without behavioral disturbance: Secondary | ICD-10-CM | POA: Insufficient documentation

## 2015-03-05 MED ORDER — LIDOCAINE-EPINEPHRINE 1 %-1:100000 IJ SOLN
INTRAMUSCULAR | Status: AC
  Administered 2015-03-05: 10:00:00
  Filled 2015-03-05: qty 1

## 2015-03-05 NOTE — ED Provider Notes (Signed)
CSN: [id number]     Arrival date & time 03/05/15  4132 History   First MD Initiated Contact with Patient 03/05/15 [phone]     Chief Complaint  Patient presents with  . Fall  . Head Laceration      HPI Pt from [place] via EMS. Per EMS-pt was walking into dayroom and stumbled hitting the back of her head on linoleum. Pt did not have LOC and has a 1 1/2 cm lac to back of head. Bleeding is controlled and pt is not on blood thinners. Pt is A&O and in NAD. Pt has hx of dementia but answers questions correctly Past Medical History  Diagnosis Date  . Hypertension   . Hypothyroidism   . Macular degeneration   . Cancer     lymphoma  . Insomnia   . OAB (overactive bladder)   . CAD (coronary artery disease) 1990    s/p stent x2,   Past Surgical History  Procedure Laterality Date  . Tonsillectomy and adenoidectomy    . Hysterotomy    . Replacement total knee bilateral    . Lymph gland excision Bilateral   . Cholecystectomy    . Cataract extraction Bilateral   . Appendectomy    . Ptca    . Back surgery     Family History  Problem Relation Age of Onset  . Arthritis Mother   . Thyroid disease Son   . Breast cancer Sister    History  Substance Use Topics  . Smoking status: Former Smoker -- 1.00 packs/day    Types: Cigarettes    Quit date: 12/09/1973  . Smokeless tobacco: Never Used  . Alcohol Use: No     Comment: quit: 1982   OB History    No data available     Review of Systems  Unable to perform ROS: Dementia      Allergies  Demerol; Morphine and related; and Macrolides and ketolides  Home Medications   Prior to Admission medications   Medication Sig Start Date End Date Taking? Authorizing Provider  albuterol (PROVENTIL) (2.5 MG/3ML) 0.083% nebulizer solution Take 3 mLs (2.5 mg total) by nebulization every 2 (two) hours as needed for wheezing or shortness of breath (first dose now please). 02/17/15  Yes Simbiso Ranga, MD  bisoprolol (ZEBETA) 5 MG  tablet Take 5 mg by mouth daily.   Yes Historical Provider, MD  buPROPion (WELLBUTRIN SR) 200 MG 12 hr tablet Take 200 mg by mouth 2 (two) times daily.   Yes Historical Provider, MD  DULoxetine (CYMBALTA) 60 MG capsule Take 60 mg by mouth 2 (two) times daily.   Yes Historical Provider, MD  ferrous sulfate 325 (65 FE) MG tablet Take 325 mg by mouth daily with breakfast.   Yes Historical Provider, MD  furosemide (LASIX) 40 MG tablet Take 0.5 tablets (20 mg total) by mouth daily. Patient taking differently: Take 40 mg by mouth daily.  02/17/15  Yes Simbiso Ranga, MD  guaiFENesin (MUCINEX) 600 MG 12 hr tablet Take 2 tablets (1,200 mg total) by mouth 2 (two) times daily. 02/17/15  Yes Simbiso Ranga, MD  levothyroxine (SYNTHROID, LEVOTHROID) 25 MCG tablet Take 25 mcg by mouth daily before breakfast.   Yes Historical Provider, MD  LORazepam (ATIVAN) 1 MG tablet Take 1 tablet (1 mg total) by mouth at bedtime. 02/17/15  Yes Simbiso Ranga, MD  Multiple Vitamins-Minerals (CENTRUM SILVER ADULT 50+) TABS Take 1 tablet by mouth daily.   Yes Historical Provider, MD  Oxycodone HCl 10 MG TABS Take 1 tablet (10 mg total) by mouth every 4 (four) hours as needed (pain). 02/17/15  Yes Simbiso Ranga, MD  potassium chloride SA (K-DUR,KLOR-CON) 20 MEQ tablet Take 20 mEq by mouth daily.   Yes Historical Provider, MD  pregabalin (LYRICA) 75 MG capsule Take 1 capsule (75 mg total) by mouth daily. 02/17/15  Yes Simbiso Ranga, MD  Simethicone (GAS-X ULTRA STRENGTH) 180 MG CAPS Take 180 mg by mouth at bedtime.   Yes Historical Provider, MD  Zinc 100 MG TABS Take 1 tablet by mouth every morning.   Yes Historical Provider, MD  predniSONE (DELTASONE) 10 MG tablet Please take 3 tablets daily for 3 days then 2 tablets daily for 2 days then 1 tablet daily for 2 days. Total 15 tablets Patient not taking: Reported on 03/05/2015 02/17/15   Simbiso Ranga, MD   BP 184/84 mmHg  Pulse 77  Temp(Src) 97.7 F (36.5 C)  Resp 22  SpO2  90% Physical Exam  Constitutional: She appears well-developed and well-nourished. No distress.  HENT:  Head: Normocephalic and atraumatic.    Eyes: Pupils are equal, round, and reactive to light.  Neck: Normal range of motion. No spinous process tenderness and no muscular tenderness present.  Cardiovascular: Normal rate and intact distal pulses.   Pulmonary/Chest: No respiratory distress.  Abdominal: Normal appearance. She exhibits no distension.  Musculoskeletal: Normal range of motion.  Neurological: She is alert. She has normal strength. No cranial nerve deficit. GCS eye subscore is 4. GCS verbal subscore is 5. GCS motor subscore is 6.  Skin: Skin is warm and dry. No rash noted.  Psychiatric: She has a normal mood and affect. Her behavior is normal.  Nursing note and vitals reviewed.   ED Course  LACERATION REPAIR Date/Time: 03/05/2015 9:37 AM Performed by: Leonard Schwartz Authorized by: Leonard Schwartz Consent: Verbal consent obtained. Written consent not obtained. Risks and benefits: risks, benefits and alternatives were discussed Consent given by: patient Patient identity confirmed: verbally with patient and arm band Time out: Immediately prior to procedure a "time out" was called to verify the correct patient, procedure, equipment, support staff and site/side marked as required. Body area: head/neck Location details: scalp Laceration length: 2 cm Anesthesia: local infiltration Local anesthetic: lidocaine 2% with epinephrine Patient sedated: no Amount of cleaning: standard Debridement: none Skin closure: staples Number of sutures: 3 Technique: simple Approximation difficulty: simple Patient tolerance: Patient tolerated the procedure well with no immediate complications   (including critical care time)  Labs Review Labs Reviewed - No data to display  Imaging Review Ct Head Wo Contrast  03/05/2015   CLINICAL DATA:  81 year old female with a history of occipital  injury after fall from standing height.  EXAM: CT HEAD WITHOUT CONTRAST  TECHNIQUE: Contiguous axial images were obtained from the base of the skull through the vertex without intravenous contrast.  COMPARISON:  Head CT 02/09/2015  FINDINGS: Unremarkable appearance of the calvarium without acute fracture or aggressive lesion. Hyperostosis of the anterior skull inter table.  Subcutaneous gas within the soft tissues overlying the occipital region. No associated fracture.  Unremarkable appearance of the bilateral orbits. Bilateral lens extraction.  Mastoid air cells are clear.  Minimal ethmoid sinus disease with opacification of left-sided air cells.  Trace mucosal disease of the left frontal sinus into the inferior recess.  Trace mucosal disease of the left maxillary sinus.  No acute intracranial hemorrhage, midline shift, mass effect.  Similar appearance of confluent hypodensity of the  bilateral hemispheric white matter.  Gray-white differentiation maintained.  Age-appropriate volume loss.  Calcifications of the intracranial circulation.  IMPRESSION: No CT evidence of acute intracranial abnormality.  Evidence of scalp trauma overlying the occipital region with subcutaneous gas. No underlying fracture.  Similar appearance of chronic white matter disease and intracranial atherosclerosis.  Signed,  Dulcy Fanny. Earleen Newport, DO  [facility]  [facility]   Electronically Signed   By: Corrie Mckusick D.O.   On: 03/05/2015 09:09      MDM   Final diagnoses:  Fall  Scalp laceration, initial encounter        Leonard Schwartz, MD 03/05/15 [id number]

## 2015-03-05 NOTE — ED Notes (Signed)
Called report back to [facility], [place], Therapist, sports

## 2015-03-05 NOTE — ED Notes (Signed)
Bed: YK99 Expected date: 03/05/15 Expected time: 8:04 AM Means of arrival: Ambulance Comments: RM 23 Fall

## 2015-03-05 NOTE — Discharge Instructions (Signed)
Staples can be removed after 3 days Fall Prevention in Hospitals As a hospital patient, your condition and the treatments you receive can increase your risk for falls. Some additional risk factors for falls in a hospital include:  Being in an unfamiliar environment.  Being on bed rest.  Your surgery.  Taking certain medicines.  Your tubing requirements, such as intravenous (IV) therapy or catheters. It is important that you learn how to decrease fall risks while at the hospital. Below are important tips that can help prevent falls. SAFETY TIPS FOR PREVENTING FALLS Talk about your risk of falling.  Ask your caregiver why you are at risk for falling. Is it your medicine, illness, tubing placement, or something else?  Make a plan with your caregiver to keep you safe from falls.  Ask your caregiver or pharmacist about side effect of your medicines. Some medicines can make you dizzy or affect your coordination. Ask for help.  Ask for help before getting out of bed. You may need to press your call button.  Ask for assistance in getting you safely to the toilet.  Ask for a walker or cane to be put at your bedside. Ask that most of the side rails on your bed be placed up before your caregiver leaves the room.  Ask family or friends to sit with you.  Ask for things that are out of your reach, such as your glasses, hearing aids, telephone, bedside table, or call button. Follow these tips to avoid falling:  Stay lying or seated, rather than standing, while waiting for help.  Wear rubber-soled slippers or shoes whenever you walk in the hospital.  Avoid quick, sudden movements.  Change positions slowly.  Sit on the side of your bed before standing.  Stand up slowly and wait before you start to walk.  Let your caregiver know if there is a spill on the floor.  Pay careful attention to the medical equipment, electrical cords, and tubes around you.  When you need help, use your  call button by your bed or in the bathroom. Wait for one of your caregivers to help you.  If you feel dizzy or unsure of your footing, return to bed and wait for assistance.  Avoid being distracted by the TV, telephone, or another person in your room.  Do not lean or support yourself on rolling objects, such as IV poles or bedside tables. Document Released: 11/22/2000 Document Revised: 11/11/2012 Document Reviewed: 08/02/2012 [facility] Patient Information 2015 [place], [place]. This information is not intended to replace advice given to you by your health care provider. Make sure you discuss any questions you have with your health care provider.  Stitches, Staples, or Skin Adhesive Strips  Stitches (sutures), staples, and skin adhesive strips hold the skin together as it heals. They will usually be in place for 7 days or less. HOME CARE  Wash your hands with soap and water before and after you touch your wound.  Only take medicine as told by your doctor.  Cover your wound only if your doctor told you to. Otherwise, leave it open to air.  Do not get your stitches wet or dirty. If they get dirty, dab them gently with a clean washcloth. Wet the washcloth with soapy water. Do not rub. Pat them dry gently.  Do not put medicine or medicated cream on your stitches unless your doctor told you to.  Do not take out your own stitches or staples. Skin adhesive strips will fall off by  themselves.  Do not pick at the wound. Picking can cause an infection.  Do not miss your follow-up appointment.  If you have problems or questions, call your doctor. GET HELP RIGHT AWAY IF:   You have a temperature by mouth above 102 F (38.9 C), not controlled by medicine.  You have chills.  You have redness or pain around your stitches.  There is puffiness (swelling) around your stitches.  You notice fluid (drainage) from your stitches.  There is a bad smell coming from your wound. MAKE SURE  YOU:  Understand these instructions.  Will watch your condition.  Will get help if you are not doing well or get worse. Document Released: 09/22/2009 Document Revised: 02/17/2012 Document Reviewed: 09/22/2009 [facility] Patient Information 2015 [place], [place]. This information is not intended to replace advice given to you by your health care provider. Make sure you discuss any questions you have with your health care provider.

## 2015-03-05 NOTE — ED Notes (Signed)
Pt from [place] via EMS. Per EMS-pt was walking into dayroom and stumbled hitting the back of her head on linoleum. Pt did not have LOC and has a 1 1/2 cm lac to back of head. Bleeding is controlled and pt is not on blood thinners. Pt is A&O and in NAD. Pt has hx of dementia but answers questions correctly

## 2015-03-05 NOTE — ED Notes (Signed)
Called [facility] to transport back to [facility]

## 2015-03-06 ENCOUNTER — Non-Acute Institutional Stay (SKILLED_NURSING_FACILITY): Payer: MEDICARE | Admitting: Internal Medicine

## 2015-03-06 ENCOUNTER — Telehealth: Payer: Self-pay | Admitting: Hematology & Oncology

## 2015-03-06 DIAGNOSIS — R258 Other abnormal involuntary movements: Secondary | ICD-10-CM | POA: Diagnosis not present

## 2015-03-06 DIAGNOSIS — R251 Tremor, unspecified: Secondary | ICD-10-CM

## 2015-03-06 DIAGNOSIS — S0100XD Unspecified open wound of scalp, subsequent encounter: Secondary | ICD-10-CM

## 2015-03-06 NOTE — Progress Notes (Signed)
Patient ID: Vicki Bauer, female   DOB: 01/22/1928, 81 y.o.   MRN: [id number]   is is an acute visit.  Level care skilled.  [facility].    Chief Complaint  Patient presents with  .  acute visit follow-up fall with scalp laceration-tremors     HPI: Patient is 81 y.o. female who is admitted to SNF after falls at home, tx for acute bronchits, and found to have pancytopenia, admitted to SNF for OT/PT  .  Nursing staff  noted what appears to be some petechia on her thighs bilaterally which was new. She has had some petechia on her legs-she was evaluated by hematology in the hospital and was thought petechia may be related to the acute infection--possibly an element of cellulitis   started to recur and she is back on Levaquin these appear to be gradually getting a bit better but still persistent-a hematology consult has been ordered.  She had been getting stronger ambulating better but apparently over the weekend she did fall and sustained a small laceration on the back of her scalp-she did go to the ER and this was stitched-she appears to be stable in this regards.  Nursing staff has noted some increased tremor activity more so her upper extremities to a lesser extent possibly lower-they state this seems to get more pronounced later in the day when patient gets a bit tired  She did not complain of any headache dizziness or syncopal-type feelings but she has noted increased tremors gradually as well.          .  Past Medical History  Diagnosis Date  . Hypertension   . Hypothyroidism   . Macular degeneration   . Cancer     lymphoma  . Insomnia   . OAB (overactive bladder)   . CAD (coronary artery disease) 1990    s/p stent x2,    Past Surgical History  Procedure Laterality Date  . Tonsillectomy and adenoidectomy    . Hysterotomy    . Replacement total knee bilateral    . Lymph gland excision Bilateral   . Cholecystectomy    . Cataract extraction  Bilateral   . Appendectomy    . Ptca    . Back surgery        Medication List        .               albuterol (2.5 MG/3ML) 0.083% nebulizer solution  Commonly known as:  PROVENTIL  Take 3 mLs (2.5 mg total) by nebulization every 2 (two) hours as needed for wheezing or shortness of breath (first dose now please).     bisoprolol 5 MG tablet  Commonly known as:  ZEBETA  Take 5 mg by mouth daily.     buPROPion 200 MG 12 hr tablet  Commonly known as:  WELLBUTRIN SR  Take 200 mg by mouth 2 (two) times daily.     CENTRUM SILVER ADULT 50+ Tabs  Take 1 tablet by mouth daily.     DULoxetine 60 MG capsule  Commonly known as:  CYMBALTA  Take 60 mg by mouth 2 (two) times daily.     furosemide 40 MG tablet  Commonly known as:  LASIX  Take 40 mg total) by mouth daily.     GAS-X ULTRA STRENGTH 180 MG Caps  Generic drug:  Simethicone  Take 180 mg by mouth at bedtime.     guaiFENesin 600 MG 12 hr tablet  Commonly known  as:  MUCINEX  Take 2 tablets (1,200 mg total) by mouth 2 (two) times daily.     levofloxacin 750 MG tablet  Commonly known as:  LEVAQUIN  Take 1 tablet (750 mg total) by mouth every other day.     levothyroxine 25 MCG tablet  Commonly known as:  SYNTHROID, LEVOTHROID  Take 25 mcg by mouth daily before breakfast.     LORazepam 1 MG tablet  Commonly known as:  ATIVAN  Take 1 tablet (1 mg total) by mouth at bedtime.     Oxycodone HCl 10 MG Tabs  Take 1 tablet (10 mg total) by mouth every 4 (four) hours as needed (pain).      Potassium 20 mEq daily          pregabalin 75 MG capsule  Commonly known as:  LYRICA  Take 1 capsule (75 mg total) by mouth daily.     Zinc 100 MG Tabs  Take 1 tablet by mouth every morning.             There is no immunization history on file for this patient.  History  Substance Use Topics  . Smoking status: Former Smoker -- 1.00 packs/day    Types: Cigarettes    Quit date: 12/09/1973  . Smokeless tobacco:  Never Used  . Alcohol Use: No     Comment: quit: 1982    Family history is noncontributory    Review of Systems  DATA OBTAINED: from patient, nurse GENERAL:  no fevers, fatigue, appetite changes SKIN: No itching, l cellulitis-petechia on lower extremities appears to be improving but is still there--recent small laceration to her scalp occipital area EYES: No eye pain, redness, discharge EARS: No earache, tinnitus, her son patient has had a gradual decline in hearing NOSE: No congestion, drainage or bleeding  MOUTH/THROAT: No mouth or tooth pain, No sore throat RESPIRATORY: No cough, wheezing, SOB CARDIAC: No chest pain, palpitations,  baseline lower extremity edema  GI: No abdominal pain, No N/V/D or constipation,   GU: No dysuria, frequency or urgency, or incontinence--apparently a urine culture is pending  MUSCULOSKELETAL: No unrelieved bone/joint pain--per nursing staff has gained strength but did fall recently NEUROLOGIC: No headache, dizziness or focal weakness however tremors appears to have increased recently as noted above PSYCHIATRIC: No overt anxiety or sadness, No behavior issue.                       Physical Exam  Dr. 97.5 pulse 87 respirations 20 blood pressure 140/80  GENERAL APPEARANCE: Alert, conversant,WF  No acute distress.  SKIN: No diaphoresis --has some splotchy appearing petechia/r bruising of her upper thighs bilaterally that appear  to be fading gradually but quite slowly-- She also has venous stasis changes to her lower legs bilaterally this is cool violaceous appearing.  She also has 2 sutures placed to the occipital area of her scalp this looks fairly benign no drainage bleeding or sign of infection HEAD: Normocephalic, atraumatic  EYES: Conjunctiva/lids clear. Pupils round, reactive. EOMs intact.  EARS: External exam WNL, canals clear. Hearing grossly normal.  NOSE: No deformity or discharge.  MOUTH/THROAT: Lips w/o lesions oropharynx is  clear mucous membranes moist  RESPIRATORY: Breathing is even, unlabored. Lung sounds are without rales or wheezes.   CARDIOVASCULAR: Heart RRR no murmurs, rubs or gallops.1 plus  peripheral edema--per nursing staff improves when she has her legs in a elevated position.   GASTROINTESTINAL: Abdomen is soft, non-tender, not distended w/  normal bowel sounds.   MUSCULOSKELETAL: No abnormal joints or musculature--she stands without assistance and ambulates with her walker--I do note some tremors  arms bilaterally-and somewhat shaky when she's getting up out of her chair-according nursing this is worse later in the day-- NEUROLOGIC:  Cranial nerves 2-12 grossly intact. Moves all extremities--tremorsas noted above--I could not really appreciate significant cogwheeling  PSYCHIATRIC: Mood and affect appropriate to situation, no behavioral issues  Patient Active Problem List   Diagnosis Date Noted  . Acute bronchitis 02/26/2015  . CKD (chronic kidney disease) stage 2, GFR 60-89 ml/min 02/26/2015  . Anemia, iron deficiency 02/26/2015  . Thrombocytopenia 02/10/2015  . Pancytopenia 02/09/2015  . Dysphasia 02/09/2015  . Chronic diastolic heart failure 84/69/6295  . CVA (cerebral infarction) 11/29/2014  . UTI (lower urinary tract infection) 11/29/2014     Labs.  03/03/2015.  Sodium 147 potassium 3.8 BUN 17 creatinine 1.12.  02/28/2015.  WBC 4.1 hemoglobin 9.5 platelets 246.  02/09/2015-TSH 2.445.    02/27/2015.  WBC 4.1 hemoglobin 9.5 platelets 246.  Sodium 144 potassium 3.4 BUN 18 creatinine 1.06 CBC    Component Value Date/Time   WBC 3.0* 02/17/2015 0509   RBC 3.43* 02/17/2015 0509   RBC 4.09 02/10/2015 1035   HGB 9.5* 02/17/2015 0509   HCT 28.9* 02/17/2015 0509   HCT 40.9 02/09/2015 1855   PLT 78* 02/17/2015 0509   MCV 84.3 02/17/2015 0509   LYMPHSABS 1.3 02/17/2015 0509   MONOABS 0.3 02/17/2015 0509   EOSABS 0.0 02/17/2015 0509   BASOSABS 0.0 02/17/2015 0509    CMP       Component Value Date/Time   NA 144 02/17/2015 0509   K 4.2 02/17/2015 0509   CL 116* 02/17/2015 0509   CO2 19 02/17/2015 0509   GLUCOSE 115* 02/17/2015 0509   BUN 40* 02/17/2015 0509   CREATININE 1.53* 02/17/2015 0509   CALCIUM 8.3* 02/17/2015 0509   PROT 4.5* 02/10/2015 0407   ALBUMIN 2.7* 02/10/2015 0407   AST 23 02/10/2015 0407   ALT 15 02/10/2015 0407   ALKPHOS 85 02/10/2015 0407   BILITOT 1.0 02/10/2015 0407   GFRNONAA 30* 02/17/2015 0509   GFRAA 35* 02/17/2015 0509    Assessment and Plan  Scalp laceration-this appears to be unremarkable stitches will have to be removed this week-I do not see any sign of infection .  Increased tremors-apparently this occurs more later in the day one would wonder about  fatigue etiology-she does have a history of hypothyroidism recent TSH was within normal limits will recheck this as well as a metabolic panel and CBC with differential to make sure electrolytes thyroid function is stable as well as anemia Consider neurology consult if this persists  Acute bronchitis Patient complained of cough on 02/15/15 prompting chest x-ray which showed "Diffuse bronchial thickening suggesting bronchitis or reactive airways disease".  In any case, she was placed on antibiotics, bronchodilators, and systemic steroids with improvement in respiratory distress  She did have some renal insufficiency with a discharge from hospital creatinine apparently of 1.53 on most recent lab this has improved to 1.12 with a BUN of 17 this was on the lab done on 03/03/2015.    Pancytopenia was found to have pancytopenia requiring bone marrow biopsy whose results suggested Myelodysplasia;WBC 3, Hb 9.5, PLT 78 Hem/Onc, Dr Marin Olp felt it was 2/2 to ongoing infection-- Platelets on lab done on March 21 showed significant improvement at 246,000 Pending hematology consult secondary family request with history of thrombocytopenia pancytopenia and petechia  Will update a CBC next  week   possible cellulitis especially left lower leg-she is on a course of Levaquin 750 mg every other day-this appears to be helping somewhat    CKD (chronic kidney disease) stage 2, GFR 60-89 ml/min GFR - 76, Cr at d/c was 1.53;lyfica was decreased and Neirontin stopped--metabolic panel done on March 25 shows improvement with a creatinine of 1.12 and BUN of 17   Anemia, iron deficiency Hb 9.5, with <10 iron; on FeSO4 325 daily--   Chronic diastolic heart failure Pt on Bblocker and lasix;-Lasix was increased recently secondary to concerns of increased lower extremity edema-she has been started on potassium as well .  [id number]

## 2015-03-06 NOTE — Telephone Encounter (Signed)
Atanza from [facility] called scheduled 4-1 appointment for pt.

## 2015-03-08 ENCOUNTER — Encounter: Payer: Self-pay | Admitting: Internal Medicine

## 2015-03-08 ENCOUNTER — Telehealth: Payer: Self-pay | Admitting: Hematology & Oncology

## 2015-03-08 DIAGNOSIS — R251 Tremor, unspecified: Secondary | ICD-10-CM | POA: Insufficient documentation

## 2015-03-08 DIAGNOSIS — S0100XA Unspecified open wound of scalp, initial encounter: Secondary | ICD-10-CM | POA: Insufficient documentation

## 2015-03-08 NOTE — Telephone Encounter (Signed)
Son called cx 4-1 with Vicki Bauer, she transferred him to me he wanted to know who scheduled that. I told him I did that the nursing home called for hospital follow up. She has not been here before no release of information on fill, but does for hospital He wanted me to put it back on the books. I did.

## 2015-03-09 ENCOUNTER — Other Ambulatory Visit: Payer: Self-pay | Admitting: Nurse Practitioner

## 2015-03-09 DIAGNOSIS — D509 Iron deficiency anemia, unspecified: Secondary | ICD-10-CM

## 2015-03-09 DIAGNOSIS — D61818 Other pancytopenia: Secondary | ICD-10-CM

## 2015-03-10 ENCOUNTER — Ambulatory Visit: Payer: MEDICARE | Admitting: Family

## 2015-03-10 ENCOUNTER — Encounter: Payer: Self-pay | Admitting: Family

## 2015-03-10 ENCOUNTER — Emergency Department (HOSPITAL_COMMUNITY): Payer: MEDICARE

## 2015-03-10 ENCOUNTER — Non-Acute Institutional Stay (SKILLED_NURSING_FACILITY): Payer: MEDICARE | Admitting: Internal Medicine

## 2015-03-10 ENCOUNTER — Other Ambulatory Visit (HOSPITAL_COMMUNITY): Payer: Self-pay

## 2015-03-10 ENCOUNTER — Other Ambulatory Visit: Payer: Self-pay

## 2015-03-10 ENCOUNTER — Other Ambulatory Visit (HOSPITAL_BASED_OUTPATIENT_CLINIC_OR_DEPARTMENT_OTHER): Payer: MEDICARE

## 2015-03-10 ENCOUNTER — Encounter: Payer: Self-pay | Admitting: Internal Medicine

## 2015-03-10 ENCOUNTER — Encounter (HOSPITAL_COMMUNITY): Payer: Self-pay | Admitting: Nurse Practitioner

## 2015-03-10 ENCOUNTER — Other Ambulatory Visit: Payer: MEDICARE

## 2015-03-10 ENCOUNTER — Encounter: Payer: Self-pay | Admitting: *Deleted

## 2015-03-10 ENCOUNTER — Inpatient Hospital Stay (HOSPITAL_COMMUNITY)
Admission: EM | Admit: 2015-03-10 | Discharge: 2015-04-09 | DRG: 871 | Disposition: E | Payer: MEDICARE | Attending: Internal Medicine | Admitting: Internal Medicine

## 2015-03-10 ENCOUNTER — Ambulatory Visit (HOSPITAL_BASED_OUTPATIENT_CLINIC_OR_DEPARTMENT_OTHER): Payer: MEDICARE | Admitting: Family

## 2015-03-10 ENCOUNTER — Ambulatory Visit (HOSPITAL_BASED_OUTPATIENT_CLINIC_OR_DEPARTMENT_OTHER): Payer: MEDICARE

## 2015-03-10 DIAGNOSIS — E039 Hypothyroidism, unspecified: Secondary | ICD-10-CM | POA: Diagnosis present

## 2015-03-10 DIAGNOSIS — Z803 Family history of malignant neoplasm of breast: Secondary | ICD-10-CM

## 2015-03-10 DIAGNOSIS — D696 Thrombocytopenia, unspecified: Secondary | ICD-10-CM | POA: Diagnosis not present

## 2015-03-10 DIAGNOSIS — I129 Hypertensive chronic kidney disease with stage 1 through stage 4 chronic kidney disease, or unspecified chronic kidney disease: Secondary | ICD-10-CM | POA: Diagnosis present

## 2015-03-10 DIAGNOSIS — G4730 Sleep apnea, unspecified: Secondary | ICD-10-CM | POA: Insufficient documentation

## 2015-03-10 DIAGNOSIS — Y95 Nosocomial condition: Secondary | ICD-10-CM | POA: Diagnosis present

## 2015-03-10 DIAGNOSIS — D509 Iron deficiency anemia, unspecified: Secondary | ICD-10-CM

## 2015-03-10 DIAGNOSIS — A419 Sepsis, unspecified organism: Secondary | ICD-10-CM | POA: Diagnosis not present

## 2015-03-10 DIAGNOSIS — D61818 Other pancytopenia: Secondary | ICD-10-CM | POA: Diagnosis present

## 2015-03-10 DIAGNOSIS — R4702 Dysphasia: Secondary | ICD-10-CM | POA: Diagnosis present

## 2015-03-10 DIAGNOSIS — J80 Acute respiratory distress syndrome: Secondary | ICD-10-CM

## 2015-03-10 DIAGNOSIS — D649 Anemia, unspecified: Secondary | ICD-10-CM

## 2015-03-10 DIAGNOSIS — R6520 Severe sepsis without septic shock: Secondary | ICD-10-CM | POA: Diagnosis present

## 2015-03-10 DIAGNOSIS — J189 Pneumonia, unspecified organism: Secondary | ICD-10-CM

## 2015-03-10 DIAGNOSIS — F419 Anxiety disorder, unspecified: Secondary | ICD-10-CM | POA: Diagnosis present

## 2015-03-10 DIAGNOSIS — E876 Hypokalemia: Secondary | ICD-10-CM | POA: Diagnosis not present

## 2015-03-10 DIAGNOSIS — Z87891 Personal history of nicotine dependence: Secondary | ICD-10-CM

## 2015-03-10 DIAGNOSIS — J123 Human metapneumovirus pneumonia: Secondary | ICD-10-CM | POA: Diagnosis present

## 2015-03-10 DIAGNOSIS — G4733 Obstructive sleep apnea (adult) (pediatric): Secondary | ICD-10-CM | POA: Diagnosis present

## 2015-03-10 DIAGNOSIS — F0390 Unspecified dementia without behavioral disturbance: Secondary | ICD-10-CM | POA: Diagnosis present

## 2015-03-10 DIAGNOSIS — N390 Urinary tract infection, site not specified: Secondary | ICD-10-CM

## 2015-03-10 DIAGNOSIS — J9600 Acute respiratory failure, unspecified whether with hypoxia or hypercapnia: Secondary | ICD-10-CM

## 2015-03-10 DIAGNOSIS — R509 Fever, unspecified: Secondary | ICD-10-CM | POA: Diagnosis not present

## 2015-03-10 DIAGNOSIS — Z66 Do not resuscitate: Secondary | ICD-10-CM | POA: Diagnosis present

## 2015-03-10 DIAGNOSIS — G629 Polyneuropathy, unspecified: Secondary | ICD-10-CM | POA: Diagnosis not present

## 2015-03-10 DIAGNOSIS — Z515 Encounter for palliative care: Secondary | ICD-10-CM

## 2015-03-10 DIAGNOSIS — J9601 Acute respiratory failure with hypoxia: Secondary | ICD-10-CM

## 2015-03-10 DIAGNOSIS — Z8572 Personal history of non-Hodgkin lymphomas: Secondary | ICD-10-CM

## 2015-03-10 DIAGNOSIS — R0902 Hypoxemia: Secondary | ICD-10-CM

## 2015-03-10 DIAGNOSIS — H3530 Unspecified macular degeneration: Secondary | ICD-10-CM | POA: Diagnosis present

## 2015-03-10 DIAGNOSIS — R0603 Acute respiratory distress: Secondary | ICD-10-CM

## 2015-03-10 DIAGNOSIS — B9620 Unspecified Escherichia coli [E. coli] as the cause of diseases classified elsewhere: Secondary | ICD-10-CM | POA: Diagnosis present

## 2015-03-10 DIAGNOSIS — N189 Chronic kidney disease, unspecified: Secondary | ICD-10-CM | POA: Diagnosis present

## 2015-03-10 DIAGNOSIS — I2510 Atherosclerotic heart disease of native coronary artery without angina pectoris: Secondary | ICD-10-CM | POA: Diagnosis present

## 2015-03-10 LAB — COMPREHENSIVE METABOLIC PANEL
ALK PHOS: 83 U/L (ref 39–117)
ALT: 21 U/L (ref 0–35)
AST: 59 U/L — AB (ref 0–37)
Albumin: 3.1 g/dL — ABNORMAL LOW (ref 3.5–5.2)
Anion gap: 12 (ref 5–15)
BUN: 21 mg/dL (ref 6–23)
CALCIUM: 8.2 mg/dL — AB (ref 8.4–10.5)
CHLORIDE: 106 mmol/L (ref 96–112)
CO2: 26 mmol/L (ref 19–32)
Creatinine, Ser: 1.23 mg/dL — ABNORMAL HIGH (ref 0.50–1.10)
GFR, EST AFRICAN AMERICAN: 45 mL/min — AB (ref >90)
GFR, EST NON AFRICAN AMERICAN: 39 mL/min — AB (ref >90)
GLUCOSE: 156 mg/dL — AB (ref 70–99)
POTASSIUM: 3.9 mmol/L (ref 3.5–5.1)
Sodium: 144 mmol/L (ref 135–145)
Total Bilirubin: 0.6 mg/dL (ref 0.3–1.2)
Total Protein: 5.8 g/dL — ABNORMAL LOW (ref 6.0–8.3)

## 2015-03-10 LAB — URINE MICROSCOPIC-ADD ON

## 2015-03-10 LAB — CBC WITH DIFFERENTIAL (CANCER CENTER ONLY)
BASO#: 0 10*3/uL (ref 0.0–0.2)
BASO%: 0.3 % (ref 0.0–2.0)
EOS%: 0 % (ref 0.0–7.0)
Eosinophils Absolute: 0 10*3/uL (ref 0.0–0.5)
HEMATOCRIT: 30.5 % — AB (ref 34.8–46.6)
HGB: 9.1 g/dL — ABNORMAL LOW (ref 11.6–15.9)
LYMPH#: 0.5 10*3/uL — ABNORMAL LOW (ref 0.9–3.3)
LYMPH%: 15.6 % (ref 14.0–48.0)
MCH: 26.5 pg (ref 26.0–34.0)
MCHC: 29.8 g/dL — AB (ref 32.0–36.0)
MCV: 89 fL (ref 81–101)
MONO#: 0.3 10*3/uL (ref 0.1–0.9)
MONO%: 9.6 % (ref 0.0–13.0)
NEUT#: 2.5 10*3/uL (ref 1.5–6.5)
NEUT%: 74.5 % (ref 39.6–80.0)
Platelets: 110 10*3/uL — ABNORMAL LOW (ref 145–400)
RBC: 3.43 10*6/uL — ABNORMAL LOW (ref 3.70–5.32)
RDW: 16.6 % — AB (ref 11.1–15.7)
WBC: 3.3 10*3/uL — ABNORMAL LOW (ref 3.9–10.0)

## 2015-03-10 LAB — BLOOD GAS, ARTERIAL
ACID-BASE DEFICIT: 0.2 mmol/L (ref 0.0–2.0)
BICARBONATE: 24.3 meq/L — AB (ref 20.0–24.0)
Drawn by: 422461
O2 Content: 6 L/min
O2 Saturation: 85.9 %
PCO2 ART: 47.2 mmHg — AB (ref 35.0–45.0)
Patient temperature: 103.2
TCO2: 23.1 mmol/L (ref 0–100)
pH, Arterial: 7.347 — ABNORMAL LOW (ref 7.350–7.450)
pO2, Arterial: 66.3 mmHg — ABNORMAL LOW (ref 80.0–100.0)

## 2015-03-10 LAB — CBC WITH DIFFERENTIAL/PLATELET
BASOS ABS: 0 10*3/uL (ref 0.0–0.1)
Basophils Relative: 0 % (ref 0–1)
EOS PCT: 0 % (ref 0–5)
Eosinophils Absolute: 0 10*3/uL (ref 0.0–0.7)
HEMATOCRIT: 30 % — AB (ref 36.0–46.0)
Hemoglobin: 8.9 g/dL — ABNORMAL LOW (ref 12.0–15.0)
Lymphocytes Relative: 17 % (ref 12–46)
Lymphs Abs: 0.5 10*3/uL — ABNORMAL LOW (ref 0.7–4.0)
MCH: 25.9 pg — AB (ref 26.0–34.0)
MCHC: 29.7 g/dL — ABNORMAL LOW (ref 30.0–36.0)
MCV: 87.5 fL (ref 78.0–100.0)
MONO ABS: 0.1 10*3/uL (ref 0.1–1.0)
Monocytes Relative: 4 % (ref 3–12)
Neutro Abs: 2.4 10*3/uL (ref 1.7–7.7)
Neutrophils Relative %: 78 % — ABNORMAL HIGH (ref 43–77)
PLATELETS: 99 10*3/uL — AB (ref 150–400)
RBC: 3.43 MIL/uL — ABNORMAL LOW (ref 3.87–5.11)
RDW: 16.7 % — AB (ref 11.5–15.5)
WBC: 3 10*3/uL — ABNORMAL LOW (ref 4.0–10.5)

## 2015-03-10 LAB — RETICULOCYTES (CHCC)
ABS Retic: 74.6 10*3/uL (ref 19.0–186.0)
RBC.: 3.39 MIL/uL — ABNORMAL LOW (ref 3.87–5.11)
Retic Ct Pct: 2.2 % (ref 0.4–2.3)

## 2015-03-10 LAB — IRON AND TIBC CHCC
%SAT: 10 % — AB (ref 21–57)
Iron: 16 ug/dL — ABNORMAL LOW (ref 41–142)
TIBC: 160 ug/dL — ABNORMAL LOW (ref 236–444)
UIBC: 144 ug/dL (ref 120–384)

## 2015-03-10 LAB — URINALYSIS, ROUTINE W REFLEX MICROSCOPIC
Bilirubin Urine: NEGATIVE
GLUCOSE, UA: NEGATIVE mg/dL
Ketones, ur: NEGATIVE mg/dL
Nitrite: POSITIVE — AB
PH: 5 (ref 5.0–8.0)
Protein, ur: 30 mg/dL — AB
Specific Gravity, Urine: 1.015 (ref 1.005–1.030)
Urobilinogen, UA: 0.2 mg/dL (ref 0.0–1.0)

## 2015-03-10 LAB — FERRITIN CHCC: Ferritin: 137 ng/ml (ref 9–269)

## 2015-03-10 LAB — BRAIN NATRIURETIC PEPTIDE: B Natriuretic Peptide: 601.4 pg/mL — ABNORMAL HIGH (ref 0.0–100.0)

## 2015-03-10 LAB — I-STAT CG4 LACTIC ACID, ED: LACTIC ACID, VENOUS: 1.46 mmol/L (ref 0.5–2.0)

## 2015-03-10 MED ORDER — VANCOMYCIN HCL IN DEXTROSE 1-5 GM/200ML-% IV SOLN
1000.0000 mg | INTRAVENOUS | Status: DC
  Administered 2015-03-11 – 2015-03-14 (×4): 1000 mg via INTRAVENOUS
  Filled 2015-03-10 (×3): qty 200

## 2015-03-10 MED ORDER — METHYLPREDNISOLONE SODIUM SUCC 125 MG IJ SOLR: 125.0000 mg | Freq: Once | INTRAMUSCULAR | Status: DC

## 2015-03-10 MED ORDER — ACETAMINOPHEN 325 MG PO TABS
650.0000 mg | ORAL_TABLET | Freq: Four times a day (QID) | ORAL | Status: DC | PRN
  Administered 2015-03-15 – 2015-03-16 (×2): 650 mg via ORAL
  Filled 2015-03-10 (×2): qty 2

## 2015-03-10 MED ORDER — SODIUM CHLORIDE 0.9 % IV BOLUS (SEPSIS)
250.0000 mL | Freq: Once | INTRAVENOUS | Status: DC
Start: 2015-03-10 — End: 2015-03-11

## 2015-03-10 MED ORDER — IPRATROPIUM-ALBUTEROL 0.5-2.5 (3) MG/3ML IN SOLN
3.0000 mL | Freq: Once | RESPIRATORY_TRACT | Status: AC
  Administered 2015-03-10: 3 mL via RESPIRATORY_TRACT
  Filled 2015-03-10: qty 3

## 2015-03-10 MED ORDER — ALBUTEROL SULFATE (2.5 MG/3ML) 0.083% IN NEBU
5.0000 mg | INHALATION_SOLUTION | Freq: Once | RESPIRATORY_TRACT | Status: AC
  Administered 2015-03-10: 5 mg via RESPIRATORY_TRACT
  Filled 2015-03-10: qty 6

## 2015-03-10 MED ORDER — DARBEPOETIN ALFA 300 MCG/0.6ML IJ SOSY
PREFILLED_SYRINGE | INTRAMUSCULAR | Status: AC
  Filled 2015-03-10: qty 0.6

## 2015-03-10 MED ORDER — ACETAMINOPHEN 650 MG RE SUPP
650.0000 mg | Freq: Once | RECTAL | Status: AC
  Administered 2015-03-10: 650 mg via RECTAL
  Filled 2015-03-10: qty 1

## 2015-03-10 MED ORDER — CEFEPIME HCL 1 G IJ SOLR
1.0000 g | Freq: Once | INTRAMUSCULAR | Status: AC
  Administered 2015-03-10: 1 g via INTRAVENOUS
  Filled 2015-03-10: qty 1

## 2015-03-10 MED ORDER — METHYLPREDNISOLONE SODIUM SUCC 125 MG IJ SOLR
60.0000 mg | Freq: Once | INTRAMUSCULAR | Status: AC
  Administered 2015-03-10: 60 mg via INTRAVENOUS
  Filled 2015-03-10: qty 2

## 2015-03-10 MED ORDER — LORAZEPAM 2 MG/ML IJ SOLN
0.5000 mg | Freq: Once | INTRAMUSCULAR | Status: AC
  Administered 2015-03-10: 0.5 mg via INTRAVENOUS
  Filled 2015-03-10: qty 1

## 2015-03-10 MED ORDER — VANCOMYCIN HCL IN DEXTROSE 1-5 GM/200ML-% IV SOLN
1000.0000 mg | INTRAVENOUS | Status: AC
  Administered 2015-03-10: 1000 mg via INTRAVENOUS
  Filled 2015-03-10: qty 200

## 2015-03-10 MED ORDER — DARBEPOETIN ALFA 300 MCG/0.6ML IJ SOSY
300.0000 ug | PREFILLED_SYRINGE | Freq: Once | INTRAMUSCULAR | Status: AC
  Administered 2015-03-10: 300 ug via SUBCUTANEOUS

## 2015-03-10 MED ORDER — SODIUM CHLORIDE 0.9 % IV BOLUS (SEPSIS)
1000.0000 mL | Freq: Once | INTRAVENOUS | Status: AC
  Administered 2015-03-10: 1000 mL via INTRAVENOUS

## 2015-03-10 MED ORDER — SODIUM CHLORIDE 0.9 % IV BOLUS (SEPSIS): 1000.0000 mL | Freq: Once | INTRAVENOUS | Status: DC

## 2015-03-10 NOTE — ED Notes (Signed)
Bed: WU88 Expected date:  Expected time:  Means of arrival:  Comments: EMS 66F AMS/PNA

## 2015-03-10 NOTE — ED Notes (Signed)
EKG given to EDP Eulis Foster for review

## 2015-03-10 NOTE — ED Provider Notes (Signed)
CSN: [id number]     Arrival date & time 03/17/2015  1938 History   First MD Initiated Contact with Patient 03/20/2015 2002     Chief Complaint  Patient presents with  . Shortness of Breath    Patient is a 81 y.o. female presenting with shortness of breath. The history is provided by the nursing home. No language interpreter was used.  Shortness of Breath  Vicki Bauer presents for evaluation of shortness of breath. Level V caveat due to confusion and respiratory distress. She is resident of [facility] assisted living and comes in for fever and increased shortness of breath. She has recently been treated for pneumonia with Avelox and steroids. History is provided by nursing home records and the patient's son.  Past Medical History  Diagnosis Date  . Hypertension   . Hypothyroidism   . Macular degeneration   . Cancer     lymphoma  . Insomnia   . OAB (overactive bladder)   . CAD (coronary artery disease) 1990    s/p stent x2,  . Sleep apnea    Past Surgical History  Procedure Laterality Date  . Tonsillectomy and adenoidectomy    . Hysterotomy    . Replacement total knee bilateral    . Lymph gland excision Bilateral   . Cholecystectomy    . Cataract extraction Bilateral   . Appendectomy    . Ptca    . Back surgery     Family History  Problem Relation Age of Onset  . Arthritis Mother   . Thyroid disease Son   . Breast cancer Sister    History  Substance Use Topics  . Smoking status: Former Smoker -- 1.00 packs/day    Types: Cigarettes    Quit date: 12/09/1973  . Smokeless tobacco: Never Used     Comment: quit smoking 40 years ago  . Alcohol Use: No     Comment: quit: 1982   OB History    No data available     Review of Systems  Unable to perform ROS Respiratory: Positive for shortness of breath.       Allergies  Demerol; Morphine and related; and Macrolides and ketolides  Home Medications   Prior to Admission medications   Medication Sig Start Date End  Date Taking? Authorizing Provider  albuterol (PROVENTIL) (2.5 MG/3ML) 0.083% nebulizer solution Take 3 mLs (2.5 mg total) by nebulization every 2 (two) hours as needed for wheezing or shortness of breath (first dose now please). 02/17/15   Simbiso Ranga, MD  bisoprolol (ZEBETA) 5 MG tablet Take 5 mg by mouth daily.    Historical Provider, MD  buPROPion (WELLBUTRIN SR) 200 MG 12 hr tablet Take 200 mg by mouth 2 (two) times daily.    Historical Provider, MD  DULoxetine (CYMBALTA) 60 MG capsule Take 60 mg by mouth 2 (two) times daily.    Historical Provider, MD  ferrous sulfate 325 (65 FE) MG tablet Take 325 mg by mouth daily with breakfast.    Historical Provider, MD  furosemide (LASIX) 40 MG tablet Take 0.5 tablets (20 mg total) by mouth daily. 02/17/15   Simbiso Ranga, MD  guaiFENesin (MUCINEX) 600 MG 12 hr tablet Take 2 tablets (1,200 mg total) by mouth 2 (two) times daily. 02/17/15   Simbiso Ranga, MD  levothyroxine (SYNTHROID, LEVOTHROID) 25 MCG tablet Take 25 mcg by mouth daily before breakfast.    Historical Provider, MD  LORazepam (ATIVAN) 1 MG tablet Take 1 tablet (1 mg total) by mouth  at bedtime. 02/17/15   Simbiso Ranga, MD  Multiple Vitamins-Minerals (CENTRUM SILVER ADULT 50+) TABS Take 1 tablet by mouth daily.    Historical Provider, MD  Oxycodone HCl 10 MG TABS Take 1 tablet (10 mg total) by mouth every 4 (four) hours as needed (pain). 02/17/15   Simbiso Ranga, MD  potassium chloride SA (K-DUR,KLOR-CON) 20 MEQ tablet Take 20 mEq by mouth daily.    Historical Provider, MD  predniSONE (DELTASONE) 10 MG tablet Please take 3 tablets daily for 3 days then 2 tablets daily for 2 days then 1 tablet daily for 2 days. Total 15 tablets 02/17/15   Simbiso Ranga, MD  pregabalin (LYRICA) 75 MG capsule Take 1 capsule (75 mg total) by mouth daily. 02/17/15   Simbiso Ranga, MD  Simethicone (GAS-X ULTRA STRENGTH) 180 MG CAPS Take 180 mg by mouth at bedtime.    Historical Provider, MD  Zinc 100 MG TABS Take 1  tablet by mouth every morning.    Historical Provider, MD   BP 139/62 mmHg  Temp(Src) 103.2 F (39.6 C) (Rectal)  Resp 26  Ht 5\' 1"  (1.549 m)  Wt 168 lb (76.204 kg)  BMI 31.76 kg/m2 Physical Exam  Constitutional: She appears well-developed and well-nourished. She appears distressed.  HENT:  Head: Normocephalic and atraumatic.  Cardiovascular: Normal rate and regular rhythm.   No murmur heard. Pulmonary/Chest: She is in respiratory distress.  Tachypnea with diffuse wheezes bilaterally  Abdominal: Soft. There is no tenderness. There is no rebound and no guarding.  Musculoskeletal: She exhibits no tenderness.  1+ pitting edema bilateral lower extremities  Neurological: She is alert.  Moves all extremities  Skin: Skin is warm and dry.  Psychiatric: She has a normal mood and affect. Her behavior is normal.  Nursing note and vitals reviewed.   ED Course  Procedures (including critical care time) CRITICAL CARE Performed by: Quintella Reichert   Total critical care time: 30 mintues  Critical care time was exclusive of separately billable procedures and treating other patients.  Critical care was necessary to treat or prevent imminent or life-threatening deterioration.  Critical care was time spent personally by me on the following activities: development of treatment plan with patient and/or surrogate as well as nursing, discussions with consultants, evaluation of patient's response to treatment, examination of patient, obtaining history from patient or surrogate, ordering and performing treatments and interventions, ordering and review of laboratory studies, ordering and review of radiographic studies, pulse oximetry and re-evaluation of patient's condition.  Labs Review Labs Reviewed  CBC WITH DIFFERENTIAL/PLATELET - Abnormal; Notable for the following:    WBC 3.0 (*)    RBC 3.43 (*)    Hemoglobin 8.9 (*)    HCT 30.0 (*)    MCH 25.9 (*)    MCHC 29.7 (*)    RDW 16.7 (*)     Platelets 99 (*)    Neutrophils Relative % 78 (*)    Lymphs Abs 0.5 (*)    All other components within normal limits  COMPREHENSIVE METABOLIC PANEL - Abnormal; Notable for the following:    Glucose, Bld 156 (*)    Creatinine, Ser 1.23 (*)    Calcium 8.2 (*)    Total Protein 5.8 (*)    Albumin 3.1 (*)    AST 59 (*)    GFR calc non Af Amer 39 (*)    GFR calc Af Amer 45 (*)    All other components within normal limits  URINALYSIS, ROUTINE W REFLEX MICROSCOPIC -  Abnormal; Notable for the following:    APPearance CLOUDY (*)    Hgb urine dipstick MODERATE (*)    Protein, ur 30 (*)    Nitrite POSITIVE (*)    Leukocytes, UA TRACE (*)    All other components within normal limits  BRAIN NATRIURETIC PEPTIDE - Abnormal; Notable for the following:    B Natriuretic Peptide 601.4 (*)    All other components within normal limits  BLOOD GAS, ARTERIAL - Abnormal; Notable for the following:    pH, Arterial 7.347 (*)    pCO2 arterial 47.2 (*)    pO2, Arterial 66.3 (*)    Bicarbonate 24.3 (*)    All other components within normal limits  URINE MICROSCOPIC-ADD ON - Abnormal; Notable for the following:    Bacteria, UA MANY (*)    All other components within normal limits  CULTURE, BLOOD (ROUTINE X 2)  CULTURE, BLOOD (ROUTINE X 2)  URINE CULTURE  I-STAT CG4 LACTIC ACID, ED    Imaging Review Dg Chest Port 1 View  03/24/2015   CLINICAL DATA:  Fever, shortness of breath, history of lymphoma  EXAM: PORTABLE CHEST - 1 VIEW  COMPARISON:  02/15/2015  FINDINGS: Multifocal patchy opacities, left upper lobe predominant, suspicious for pneumonia.  No pleural effusion or pneumothorax.  Cardiomegaly.  IMPRESSION: Multifocal patchy opacities, left upper lobe predominant, suspicious for pneumonia.   Electronically Signed   By: Julian Hy M.D.   On: 04/06/2015 21:10     EKG Interpretation None      MDM   Final diagnoses:  Fever  HCAP (healthcare-associated pneumonia)  Sepsis, due to unspecified  organism  Acute UTI (urinary tract infection)   Patient here for progressive shortness of breath. Patient respiratory distress on initial evaluation with diffuse wheezes and tachypnea. Provided albuterol with minimal relief in her symptoms. Chest x-ray and history is concerning for healthcare associated pneumonia. Patient is currently been on treatment with Avelox without change in her symptoms. Patient was surrounding vancomycin and cefepime. UA is consistent with urinary tract infection. Patient does have significant anxiety in the emergency department and is pulling at her supplemental oxygen and nebulizers, she provided a small dose of Ativan for anxiolysis. Discussed with intensivist who will see the patient in consult. Discussed with patient's on her gastritis. He wishes her to be DO NOT RESUSCITATE but does want aggressive measures done if needed including endotracheal intubation and ICU level care.    Quintella Reichert, MD 03/11/15 0000

## 2015-03-10 NOTE — Progress Notes (Addendum)
ANTIBIOTIC CONSULT NOTE - INITIAL  Pharmacy Consult for Vancomycin/cefepime Indication: rule out pneumonia  Allergies  Allergen Reactions  . Demerol [Meperidine] Other (See Comments)    Turned patient blue and couldn't breathe  . Morphine And Related Other (See Comments)    Turned patient blue and couldn't breathe   . Macrolides And Ketolides Nausea Only    Patient Measurements: Height: 5\' 1"  (154.9 cm) Weight: 168 lb (76.204 kg) IBW/kg (Calculated) : 47.8  Vital Signs: Temp: 103.2 F (39.6 C) (04/01 1951) Temp Source: Rectal (04/01 1951) BP: 139/62 mmHg (04/01 1951) Pulse Rate: 90 (04/01 1348) Intake/Output from previous day:   Intake/Output from this shift:    Labs:  Recent Labs  04/01/2015 0957  WBC 3.3*  HGB 9.1*  PLT 110*   CrCl cannot be calculated (Patient has no serum creatinine result on file.). No results for input(s): VANCOTROUGH, VANCOPEAK, VANCORANDOM, GENTTROUGH, GENTPEAK, GENTRANDOM, TOBRATROUGH, TOBRAPEAK, TOBRARND, AMIKACINPEAK, AMIKACINTROU, AMIKACIN in the last 72 hours.   Microbiology: Recent Results (from the past 720 hour(s))  Blood culture (routine x 2)     Status: None   Collection Time: 02/09/15 10:38 AM  Result Value Ref Range Status   Specimen Description BLOOD LEFT ANTECUBITAL  Final   Special Requests BOTTLES DRAWN AEROBIC AND ANAEROBIC 5ML  Final   Culture   Final    NO GROWTH 5 DAYS Performed at [facility]    Report Status 02/15/2015 FINAL  Final  Blood culture (routine x 2)     Status: None   Collection Time: 02/09/15 10:38 AM  Result Value Ref Range Status   Specimen Description BLOOD RIGHT ANTECUBITAL  Final   Special Requests BOTTLES DRAWN AEROBIC AND ANAEROBIC 4ML  Final   Culture   Final    NO GROWTH 5 DAYS Performed at [facility]    Report Status 02/15/2015 FINAL  Final  Urine culture     Status: None   Collection Time: 02/09/15 11:35 AM  Result Value Ref Range Status   Specimen Description  URINE, CLEAN CATCH  Final   Special Requests NONE  Final   Colony Count   Final    >=100,000 COLONIES/ML Performed at [facility]    Culture   Final    ENTEROCOCCUS SPECIES Performed at [facility]    Report Status 02/11/2015 FINAL  Final   Organism ID, Bacteria ENTEROCOCCUS SPECIES  Final      Susceptibility   Enterococcus species - MIC*    AMPICILLIN <=2 SENSITIVE Sensitive     LEVOFLOXACIN 1 SENSITIVE Sensitive     NITROFURANTOIN <=16 SENSITIVE Sensitive     VANCOMYCIN 2 SENSITIVE Sensitive     TETRACYCLINE >=16 RESISTANT Resistant     * ENTEROCOCCUS SPECIES  Clostridium Difficile by PCR     Status: None   Collection Time: 02/11/15  6:50 PM  Result Value Ref Range Status   C difficile by pcr NEGATIVE NEGATIVE Final    Medical History: Past Medical History  Diagnosis Date  . Hypertension   . Hypothyroidism   . Macular degeneration   . Cancer     lymphoma  . Insomnia   . OAB (overactive bladder)   . CAD (coronary artery disease) 1990    s/p stent x2,  . Sleep apnea     Medications:  Scheduled:   Infusions:  . ceFEPime (MAXIPIME) IV    . sodium chloride    . sodium chloride    . sodium chloride    .  vancomycin    . [START ON 03/11/2015] vancomycin     PRN: acetaminophen  Assessment: 81 yo female ALF resident brought to ED with SOB and fever, recently treated for pneumonia with avelox and steroids.  Patient has been ordered one dose of cefepime and pharmacy is consulted to dose vancomycin for pneumonia.  Tmax: 103.2 WBC: 3.3 Renal: SCr 1.23, CrCl 31 ml/min CG, 38 ml/min/1.51m2 (normalized)  4/1 blood x 2: sent 4/1 urine: sent  Goal of Therapy:  Vancomycin trough level 15-20 mcg/ml  Cefepime dosed based on patient weight and renal function   Plan:   Vancomycin 1g IV q24h Check trough at steady state Follow up renal function & cultures, clinical course Cefepime 1gm iv q12hr  Peggyann Juba, PharmD, BCPS Pager:  [phone] 04/08/2015,8:17 PM

## 2015-03-10 NOTE — Progress Notes (Signed)
MRN: [id number] Name: NATHALI VENT  Sex: female Age: 81 y.o. DOB: [birth year]  [place] #: [facility] Facility/Room: 103 Level Of Care: SNF Provider: Inocencio Homes D Emergency Contacts: Extended Emergency Contact Information Primary Emergency Contact: Clayton,Thomas Address: [address]          [place], [place] [address] [place] Phone: [phone] Mobile Phone: [phone] Relation: Son Secondary Emergency Contact: [place] Work Phone: [phone] Relation: Son  Code Status: DNR  Allergies: Demerol; Morphine and related; and Macrolides and ketolides  Chief Complaint  Patient presents with  . Acute Visit    HPI: Patient is 81 y.o. female who nursing asked me to see acutely. Pt was fine this am, went to Hem-Onc, went to lunch, came back and was resting quietly. A few minutes later pt was wheezing with O2 sat 74%.  Past Medical History  Diagnosis Date  . Hypertension   . Hypothyroidism   . Macular degeneration   . Cancer     lymphoma  . Insomnia   . OAB (overactive bladder)   . CAD (coronary artery disease) 1990    s/p stent x2,  . Sleep apnea     Past Surgical History  Procedure Laterality Date  . Tonsillectomy and adenoidectomy    . Hysterotomy    . Replacement total knee bilateral    . Lymph gland excision Bilateral   . Cholecystectomy    . Cataract extraction Bilateral   . Appendectomy    . Ptca    . Back surgery        Medication List       This list is accurate as of: 04/08/2015  7:40 PM.  Always use your most recent med list.               albuterol (2.5 MG/3ML) 0.083% nebulizer solution  Commonly known as:  PROVENTIL  Take 3 mLs (2.5 mg total) by nebulization every 2 (two) hours as needed for wheezing or shortness of breath (first dose now please).     bisoprolol 5 MG tablet  Commonly known as:  ZEBETA  Take 5 mg by mouth daily.     buPROPion 200 MG 12 hr  tablet  Commonly known as:  WELLBUTRIN SR  Take 200 mg by mouth 2 (two) times daily.     CENTRUM SILVER ADULT 50+ Tabs  Take 1 tablet by mouth daily.     DULoxetine 60 MG capsule  Commonly known as:  CYMBALTA  Take 60 mg by mouth 2 (two) times daily.     ferrous sulfate 325 (65 FE) MG tablet  Take 325 mg by mouth daily with breakfast.     furosemide 40 MG tablet  Commonly known as:  LASIX  Take 0.5 tablets (20 mg total) by mouth daily.     GAS-X ULTRA STRENGTH 180 MG Caps  Generic drug:  Simethicone  Take 180 mg by mouth at bedtime.     guaiFENesin 600 MG 12 hr tablet  Commonly known as:  MUCINEX  Take 2 tablets (1,200 mg total) by mouth 2 (two) times daily.     levothyroxine 25 MCG tablet  Commonly known as:  SYNTHROID, LEVOTHROID  Take 25 mcg by mouth daily before breakfast.     LORazepam 1 MG tablet  Commonly known as:  ATIVAN  Take 1 tablet (1 mg total) by mouth at bedtime.  Oxycodone HCl 10 MG Tabs  Take 1 tablet (10 mg total) by mouth every 4 (four) hours as needed (pain).     potassium chloride SA 20 MEQ tablet  Commonly known as:  K-DUR,KLOR-CON  Take 20 mEq by mouth daily.     predniSONE 10 MG tablet  Commonly known as:  DELTASONE  Please take 3 tablets daily for 3 days then 2 tablets daily for 2 days then 1 tablet daily for 2 days. Total 15 tablets     pregabalin 75 MG capsule  Commonly known as:  LYRICA  Take 1 capsule (75 mg total) by mouth daily.     Zinc 100 MG Tabs  Take 1 tablet by mouth every evening.        No orders of the defined types were placed in this encounter.     There is no immunization history on file for this patient.  History  Substance Use Topics  . Smoking status: Former Smoker -- 1.00 packs/day    Types: Cigarettes    Quit date: 12/09/1973  . Smokeless tobacco: Never Used     Comment: quit smoking 40 years ago  . Alcohol Use: No     Comment: quit: 1982    Review of Systems  DATA OBTAINED: from nurse,  medical record GENERAL:  no fevers, fatigue, appetite changes SKIN: No itching, rash HEENT: No complaint RESPIRATORY: baseline cough, not worse, chronic O2, sudden onset wheezing on bed check during rest period, O2 sat 74%;pt has been dx with PNA 2 days ago and Avelox started and steroid taper, q 6 nebs CARDIAC: No chest pain, palpitations, lower extremity edema  GI: No abdominal pain, No N/V/D or constipation, No heartburn or reflux  GU: No dysuria, frequency or urgency, or incontinence  MUSCULOSKELETAL: No unrelieved bone/joint pain NEUROLOGIC: No headache, dizziness; pt has had hand tremors, now upper extremity jerking, worse with stress PSYCHIATRIC: No overt anxiety or sadness  Filed Vitals:   04/03/2015 1348  BP: 120/70  Pulse: 90  Temp: 97.4 F (36.3 C)  Resp: 24    Physical Exam  GENERAL APPEARANCE: Alert, conversant, confusion on and off over 20 minutes  SKIN: 1.5 by 3 cm escar RLE, no redness, heat or d/c HEENT: Unremarkable RESPIRATORY: Breathing  Is RR increased, mild labored. Lung sounds are wheezing throughout, rales B bases   CARDIOVASCULAR: Heart RRR no murmurs, rubs or gallops. trace peripheral edema  GASTROINTESTINAL: Abdomen is soft, non-tender, not distended w/ normal bowel sounds.  GENITOURINARY: Bladder non tender, not distended  MUSCULOSKELETAL: upper extremity involuntary jerking NEUROLOGIC: Cranial nerves 2-12 grossly intact. Moves all extremities PSYCHIATRIC: pt is anxious  Patient Active Problem List   Diagnosis Date Noted  . HCAP (healthcare-associated pneumonia) 03/11/2015  . Acute respiratory distress 04/05/2015  . Sleep apnea   . Occasional tremors 03/08/2015  . Scalp wound 03/08/2015  . Cellulitis of leg, left 03/02/2015  . Acute bronchitis 02/26/2015  . CKD (chronic kidney disease) stage 2, GFR 60-89 ml/min 02/26/2015  . Anemia, iron deficiency 02/26/2015  . GERD (gastroesophageal reflux disease) 02/26/2015  . Thrombocytopenia 02/10/2015  .  Pancytopenia 02/09/2015  . Dysphasia 02/09/2015  . Chronic diastolic heart failure 96/03/5408  . CVA (cerebral infarction) 11/29/2014  . UTI (lower urinary tract infection) 11/29/2014    CBC    Component Value Date/Time   WBC 3.0* 03/11/2015 2011   WBC 3.3* 03/23/2015 0957   RBC 3.43* 03/29/2015 2011   RBC 3.39* 03/29/2015 0957   RBC 3.43* 03/11/2015  0957   HGB 8.9* 03/21/2015 2011   HGB 9.1* 03/19/2015 0957   HCT 30.0* 04/06/2015 2011   HCT 30.5* 03/22/2015 0957   HCT 40.9 02/09/2015 1855   PLT 99* 04/04/2015 2011   PLT 110* 03/25/2015 0957   MCV 87.5 03/24/2015 2011   MCV 89 04/03/2015 0957   LYMPHSABS 0.5* 03/12/2015 2011   LYMPHSABS 0.5* 03/28/2015 0957   MONOABS 0.1 03/12/2015 2011   EOSABS 0.0 03/21/2015 2011   EOSABS 0.0 04/07/2015 0957   BASOSABS 0.0 03/30/2015 2011   BASOSABS 0.0 03/24/2015 0957    CMP     Component Value Date/Time   NA 144 03/26/2015 2011   K 3.9 03/31/2015 2011   CL 106 03/19/2015 2011   CO2 26 04/02/2015 2011   GLUCOSE 156* 03/11/2015 2011   BUN 21 04/06/2015 2011   CREATININE 1.23* 04/03/2015 2011   CALCIUM 8.2* 03/15/2015 2011   PROT 5.8* 04/02/2015 2011   ALBUMIN 3.1* 04/03/2015 2011   AST 59* 03/31/2015 2011   ALT 21 03/14/2015 2011   ALKPHOS 83 03/31/2015 2011   BILITOT 0.6 03/31/2015 2011   GFRNONAA 39* 04/02/2015 2011   GFRAA 45* 03/23/2015 2011    Assessment and Plan  Acute respiratory distress Pt with sudden onset of wheezing with O2 sat 74%. Oxygen was increased to as high as it would go,and pt's CPAP mask was placed. This increased pt's O2 sat almost immediately back into the 90's. Pt was dx with PNA 2 days ago, on Avelox, nebs,steroid taper,does not look septic. Pt's lasix was increased recently for 2+ edema which is now trace edema.Pt's intermiddent confusion is still present, worse when she doesn't wear her CPAP at night which is often. Pt has muscle twitching BUE, worse with stress, that stopped also once pt settled  into CPAP. Pt seen by HemOnc today for MDD, her Hb was 9.1. Plan LE [place] to r/o DVT for possible PE.     Hennie Duos, MD

## 2015-03-10 NOTE — Progress Notes (Signed)
MRN: [id number] Name: Vicki Bauer  Sex: female Age: 81 y.o. DOB: 05/14/1928  [place] #:  Facility/Room: Level Of Care: SNF Provider: Inocencio Homes D Emergency Contacts: Extended Emergency Contact Information Primary Emergency Contact: Clayton,Thomas Address: [address]          [place], [place] [address] [place] Phone: [phone] Mobile Phone: [phone] Relation: Son Secondary Emergency Contact: [place] Work Phone: [phone] Relation: Son  Code Status:   Allergies: Demerol; Morphine and related; and Macrolides and ketolides  Chief Complaint  Patient presents with  . Acute Visit    HPI: Patient is 81 y.o. female who   Past Medical History  Diagnosis Date  . Hypertension   . Hypothyroidism   . Macular degeneration   . Cancer     lymphoma  . Insomnia   . OAB (overactive bladder)   . CAD (coronary artery disease) 1990    s/p stent x2,    Past Surgical History  Procedure Laterality Date  . Tonsillectomy and adenoidectomy    . Hysterotomy    . Replacement total knee bilateral    . Lymph gland excision Bilateral   . Cholecystectomy    . Cataract extraction Bilateral   . Appendectomy    . Ptca    . Back surgery        Medication List       This list is accurate as of: 03/15/2015  1:53 PM.  Always use your most recent med list.               albuterol (2.5 MG/3ML) 0.083% nebulizer solution  Commonly known as:  PROVENTIL  Take 3 mLs (2.5 mg total) by nebulization every 2 (two) hours as needed for wheezing or shortness of breath (first dose now please).     bisoprolol 5 MG tablet  Commonly known as:  ZEBETA  Take 5 mg by mouth daily.     buPROPion 200 MG 12 hr tablet  Commonly known as:  WELLBUTRIN SR  Take 200 mg by mouth 2 (two) times daily.     CENTRUM SILVER ADULT 50+ Tabs  Take 1 tablet by mouth daily.     DULoxetine 60 MG capsule  Commonly known as:   CYMBALTA  Take 60 mg by mouth 2 (two) times daily.     ferrous sulfate 325 (65 FE) MG tablet  Take 325 mg by mouth daily with breakfast.     furosemide 40 MG tablet  Commonly known as:  LASIX  Take 0.5 tablets (20 mg total) by mouth daily.     GAS-X ULTRA STRENGTH 180 MG Caps  Generic drug:  Simethicone  Take 180 mg by mouth at bedtime.     guaiFENesin 600 MG 12 hr tablet  Commonly known as:  MUCINEX  Take 2 tablets (1,200 mg total) by mouth 2 (two) times daily.     levothyroxine 25 MCG tablet  Commonly known as:  SYNTHROID, LEVOTHROID  Take 25 mcg by mouth daily before breakfast.     LORazepam 1 MG tablet  Commonly known as:  ATIVAN  Take 1 tablet (1 mg total) by mouth at bedtime.     Oxycodone HCl 10 MG Tabs  Take 1 tablet (10 mg total) by mouth every 4 (four) hours as needed (pain).     potassium chloride SA 20 MEQ tablet  Commonly known as:  K-DUR,KLOR-CON  Take  20 mEq by mouth daily.     predniSONE 10 MG tablet  Commonly known as:  DELTASONE  Please take 3 tablets daily for 3 days then 2 tablets daily for 2 days then 1 tablet daily for 2 days. Total 15 tablets     pregabalin 75 MG capsule  Commonly known as:  LYRICA  Take 1 capsule (75 mg total) by mouth daily.     Zinc 100 MG Tabs  Take 1 tablet by mouth every morning.        No orders of the defined types were placed in this encounter.     There is no immunization history on file for this patient.  History  Substance Use Topics  . Smoking status: Former Smoker -- 1.00 packs/day    Types: Cigarettes    Quit date: 12/09/1973  . Smokeless tobacco: Never Used     Comment: quit smoking 40 years ago  . Alcohol Use: No     Comment: quit: 1982    Review of Systems  DATA OBTAINED: from patient, nurse, medical record, family member GENERAL:  no fevers, fatigue, appetite changes SKIN: No itching, rash HEENT: No complaint RESPIRATORY: No cough, wheezing, SOB CARDIAC: No chest pain, palpitations, lower  extremity edema  GI: No abdominal pain, No N/V/D or constipation, No heartburn or reflux  GU: No dysuria, frequency or urgency, or incontinence  MUSCULOSKELETAL: No unrelieved bone/joint pain NEUROLOGIC: No headache, dizziness  PSYCHIATRIC: No overt anxiety or sadness  Filed Vitals:   03/17/2015 1348  BP: 124/68  Pulse: 64  Temp: 96.2 F (35.7 C)  Resp: 18    Physical Exam  GENERAL APPEARANCE: Alert, conversant, No acute distress  SKIN: No diaphoresis rash, or wounds HEENT: Unremarkable RESPIRATORY: Breathing is even, unlabored. Lung sounds are clear   CARDIOVASCULAR: Heart RRR no murmurs, rubs or gallops. No peripheral edema  GASTROINTESTINAL: Abdomen is soft, non-tender, not distended w/ normal bowel sounds.  GENITOURINARY: Bladder non tender, not distended  MUSCULOSKELETAL: No abnormal joints or musculature NEUROLOGIC: Cranial nerves 2-12 grossly intact. Moves all extremities PSYCHIATRIC: Mood and affect appropriate to situation, no behavioral issues  Patient Active Problem List   Diagnosis Date Noted  . Occasional tremors 03/08/2015  . Scalp wound 03/08/2015  . Cellulitis of leg, left 03/02/2015  . Acute bronchitis 02/26/2015  . CKD (chronic kidney disease) stage 2, GFR 60-89 ml/min 02/26/2015  . Anemia, iron deficiency 02/26/2015  . GERD (gastroesophageal reflux disease) 02/26/2015  . Thrombocytopenia 02/10/2015  . Pancytopenia 02/09/2015  . Dysphasia 02/09/2015  . Chronic diastolic heart failure 09/81/1914  . CVA (cerebral infarction) 11/29/2014  . UTI (lower urinary tract infection) 11/29/2014    CBC    Component Value Date/Time   WBC 3.3* 04/02/2015 0957   WBC 3.0* 02/17/2015 0509   RBC 3.43* 04/06/2015 0957   RBC 3.43* 02/17/2015 0509   RBC 4.09 02/10/2015 1035   HGB 9.1* 03/23/2015 0957   HGB 9.5* 02/17/2015 0509   HCT 30.5* 03/30/2015 0957   HCT 28.9* 02/17/2015 0509   HCT 40.9 02/09/2015 1855   PLT 110* 03/17/2015 0957   PLT 78* 02/17/2015 0509    MCV 89 04/01/2015 0957   MCV 84.3 02/17/2015 0509   LYMPHSABS 0.5* 03/21/2015 0957   LYMPHSABS 1.3 02/17/2015 0509   MONOABS 0.3 02/17/2015 0509   EOSABS 0.0 04/01/2015 0957   EOSABS 0.0 02/17/2015 0509   BASOSABS 0.0 03/17/2015 0957   BASOSABS 0.0 02/17/2015 0509    CMP  Component Value Date/Time   NA 144 02/17/2015 0509   K 4.2 02/17/2015 0509   CL 116* 02/17/2015 0509   CO2 19 02/17/2015 0509   GLUCOSE 115* 02/17/2015 0509   BUN 40* 02/17/2015 0509   CREATININE 1.53* 02/17/2015 0509   CALCIUM 8.3* 02/17/2015 0509   PROT 4.5* 02/10/2015 0407   ALBUMIN 2.7* 02/10/2015 0407   AST 23 02/10/2015 0407   ALT 15 02/10/2015 0407   ALKPHOS 85 02/10/2015 0407   BILITOT 1.0 02/10/2015 0407   GFRNONAA 30* 02/17/2015 0509   GFRAA 35* 02/17/2015 0509    Assessment and Plan  No problem-specific assessment & plan notes found for this encounter.   Hennie Duos, MD

## 2015-03-10 NOTE — Assessment & Plan Note (Addendum)
Pt with sudden onset of wheezing with O2 sat 74%. Oxygen was increased to as high as it would go,and pt's CPAP mask was placed. This increased pt's O2 sat almost immediately back into the 90's. Pt was dx with PNA 2 days ago, on Avelox, nebs,steroid taper,no fever, WBC 3.3, no change, does not look septic. Pt's lasix was increased recently for 2+ edema which is now trace edema.Pt's intermiddent confusion is still present, worse when she doesn't wear her CPAP at night which is often. Pt has muscle twitching BUE, worse with stress, that stopped also once pt settled into CPAP. Pt seen by HemOnc today for MDD, her Hb was 9.1. Plan LE [place] to r/o DVT for possible PE.

## 2015-03-10 NOTE — Progress Notes (Signed)
Hematology and Oncology Follow Up Visit  Vicki Bauer [id number] January 31, 1928 81 y.o. 03/24/2015   Principle Diagnosis:  Thrombocytopenia Pancytopenia  Multifactorial anemia (iron deficiency/renal insufficiency) History of Lymphoma (20 years ago and again 15 years ago, both times treated with chemotherapy) - in remission  Current Therapy:   Aranesp 300 mg for Hgb less than 10    Interim History:  Vicki Bauer is here for a follow-up. She is confused and sleepy today. Her son was the primary source of information today. He says she has not been wearing her CPAP at night. She denies pain and states that she feels "good."  No fever, chills, n/v, cough, rash, dizziness, headache, chest pain, palpitations, abdominal pain, constipation, diarrhea, blood in urine or stool. No episodes of bleeding. She is SOB at times with exertion. She is on 3L [place] at this time.  Her platelet count is now up to 110. She has some bruising on her arms but this appears to be improving. Her WBC count is 3.3. She has had no infections.  She has some neuropathy in her feet. No swelling or tenderness in her extremities.  Her appetite is ok and she is drinking fluids. Her weight is stable.   Medications:    Medication List       This list is accurate as of: 04/07/2015 10:23 AM.  Always use your most recent med list.               albuterol (2.5 MG/3ML) 0.083% nebulizer solution  Commonly known as:  PROVENTIL  Take 3 mLs (2.5 mg total) by nebulization every 2 (two) hours as needed for wheezing or shortness of breath (first dose now please).     bisoprolol 5 MG tablet  Commonly known as:  ZEBETA  Take 5 mg by mouth daily.     buPROPion 200 MG 12 hr tablet  Commonly known as:  WELLBUTRIN SR  Take 200 mg by mouth 2 (two) times daily.     CENTRUM SILVER ADULT 50+ Tabs  Take 1 tablet by mouth daily.     DULoxetine 60 MG capsule  Commonly known as:  CYMBALTA  Take 60 mg by mouth 2 (two) times daily.     ferrous sulfate 325 (65 FE) MG tablet  Take 325 mg by mouth daily with breakfast.     furosemide 40 MG tablet  Commonly known as:  LASIX  Take 0.5 tablets (20 mg total) by mouth daily.     GAS-X ULTRA STRENGTH 180 MG Caps  Generic drug:  Simethicone  Take 180 mg by mouth at bedtime.     guaiFENesin 600 MG 12 hr tablet  Commonly known as:  MUCINEX  Take 2 tablets (1,200 mg total) by mouth 2 (two) times daily.     levothyroxine 25 MCG tablet  Commonly known as:  SYNTHROID, LEVOTHROID  Take 25 mcg by mouth daily before breakfast.     LORazepam 1 MG tablet  Commonly known as:  ATIVAN  Take 1 tablet (1 mg total) by mouth at bedtime.     Oxycodone HCl 10 MG Tabs  Take 1 tablet (10 mg total) by mouth every 4 (four) hours as needed (pain).     potassium chloride SA 20 MEQ tablet  Commonly known as:  K-DUR,KLOR-CON  Take 20 mEq by mouth daily.     predniSONE 10 MG tablet  Commonly known as:  DELTASONE  Please take 3 tablets daily for 3 days then 2 tablets daily for  2 days then 1 tablet daily for 2 days. Total 15 tablets     pregabalin 75 MG capsule  Commonly known as:  LYRICA  Take 1 capsule (75 mg total) by mouth daily.     Zinc 100 MG Tabs  Take 1 tablet by mouth every morning.        Allergies:  Allergies  Allergen Reactions  . Demerol [Meperidine] Other (See Comments)    Turned patient blue and couldn't breathe  . Morphine And Related Other (See Comments)    Turned patient blue and couldn't breathe   . Macrolides And Ketolides Nausea Only    Past Medical History, Surgical history, Social history, and Family History were reviewed and updated.  Review of Systems: All other 10 point review of systems is negative.   Physical Exam:  height is 5\' 2"  (1.575 m) and weight is 168 lb (76.204 kg). Her oral temperature is 98.2 F (36.8 C). Her blood pressure is 132/59 and her pulse is 80. Her respiration is 16.   Wt Readings from Last 3 Encounters:  03/14/2015 168 lb  (76.204 kg)  02/17/15 169 lb 9.6 oz (76.93 kg)  11/30/14 164 lb 8 oz (74.617 kg)    Ocular: Sclerae unicteric, pupils equal, round and reactive to light Ear-nose-throat: Oropharynx clear, dentition fair Lymphatic: No cervical or supraclavicular adenopathy Lungs no rales or rhonchi, good excursion bilaterally Heart regular rate and rhythm, no murmur appreciated Abd soft, nontender, positive bowel sounds MSK no focal spinal tenderness, no joint edema Neuro: non-focal, well-oriented, appropriate affect Breasts: Deferred  Lab Results  Component Value Date   WBC 3.0* 02/17/2015   HGB 9.5* 02/17/2015   HCT 28.9* 02/17/2015   MCV 84.3 02/17/2015   PLT 78* 02/17/2015   Lab Results  Component Value Date   FERRITIN 138 02/10/2015   IRON <10* 02/10/2015   TIBC Not calculated due to Iron <10. 02/10/2015   UIBC 149 02/10/2015   IRONPCTSAT Not calculated due to Iron <10. 02/10/2015   Lab Results  Component Value Date   RETICCTPCT 2.3 02/10/2015   RBC 3.43* 02/17/2015   No results found for: KPAFRELGTCHN, LAMBDASER, KAPLAMBRATIO No results found for: IGGSERUM, IGA, IGMSERUM No results found for: Odetta Pink, SPEI   Chemistry      Component Value Date/Time   NA 144 02/17/2015 0509   K 4.2 02/17/2015 0509   CL 116* 02/17/2015 0509   CO2 19 02/17/2015 0509   BUN 40* 02/17/2015 0509   CREATININE 1.53* 02/17/2015 0509      Component Value Date/Time   CALCIUM 8.3* 02/17/2015 0509   ALKPHOS 85 02/10/2015 0407   AST 23 02/10/2015 0407   ALT 15 02/10/2015 0407   BILITOT 1.0 02/10/2015 0407     Impression and Plan: Vicki Bauer is a very pleasant 81 yo female with thrombocytopenia, pancytopenia and multifactorial anemia.  Her platelet count is improving. She has had no episodes of bleeding. No issue with infections. She is confused and is not wearing her CPAP. I discussed the importance of her wearing this at night and he is  going to speak with the nursing home supervisor about this.   I sent a letter no her SNF and asked that they stop her oral iron. We will see what her iron studies show and give her Madilyn Hook if needed.  We will give her a dose of Aranesp today.  We will see her back in 6 weeks for labs and  follow-up.  She knows to call here with any questions or concerns. We can certainly see her sooner if need be.   Eliezer Bottom, NP 4/1/201610:23 AM

## 2015-03-10 NOTE — ED Notes (Signed)
Pt presents from [facility], in mild respiratory distress, receiving 6L [place] to maintain O2 >90%, EMS gave 5 mg albuterol and 0.5mg  of Atrovent, 125 mg Solumedrol, pt recently seen for pneumonia, currently AOx2.

## 2015-03-10 NOTE — Patient Instructions (Signed)
Darbepoetin Alfa injection What is this medicine? DARBEPOETIN ALFA (dar be POE e tin AL fa) helps your body make more red blood cells. It is used to treat anemia caused by chronic kidney failure and chemotherapy. This medicine may be used for other purposes; ask your health care provider or pharmacist if you have questions. COMMON BRAND NAME(S): Aranesp What should I tell my health care provider before I take this medicine? They need to know if you have any of these conditions: -blood clotting disorders or history of blood clots -cancer patient not on chemotherapy -cystic fibrosis -heart disease, such as angina, heart failure, or a history of a heart attack -hemoglobin level of 12 g/dL or greater -high blood pressure -low levels of folate, iron, or vitamin B12 -seizures -an unusual or allergic reaction to darbepoetin, erythropoietin, albumin, hamster proteins, latex, other medicines, foods, dyes, or preservatives -pregnant or trying to get pregnant -breast-feeding How should I use this medicine? This medicine is for injection into a vein or under the skin. It is usually given by a health care professional in a hospital or clinic setting. If you get this medicine at home, you will be taught how to prepare and give this medicine. Do not shake the solution before you withdraw a dose. Use exactly as directed. Take your medicine at regular intervals. Do not take your medicine more often than directed. It is important that you put your used needles and syringes in a special sharps container. Do not put them in a trash can. If you do not have a sharps container, call your pharmacist or healthcare provider to get one. Talk to your pediatrician regarding the use of this medicine in children. While this medicine may be used in children as young as 1 year for selected conditions, precautions do apply. Overdosage: If you think you have taken too much of this medicine contact a poison control center or  emergency room at once. NOTE: This medicine is only for you. Do not share this medicine with others. What if I miss a dose? If you miss a dose, take it as soon as you can. If it is almost time for your next dose, take only that dose. Do not take double or extra doses. What may interact with this medicine? Do not take this medicine with any of the following medications: -epoetin alfa This list may not describe all possible interactions. Give your health care provider a list of all the medicines, herbs, non-prescription drugs, or dietary supplements you use. Also tell them if you smoke, drink alcohol, or use illegal drugs. Some items may interact with your medicine. What should I watch for while using this medicine? Visit your prescriber or health care professional for regular checks on your progress and for the needed blood tests and blood pressure measurements. It is especially important for the doctor to make sure your hemoglobin level is in the desired range, to limit the risk of potential side effects and to give you the best benefit. Keep all appointments for any recommended tests. Check your blood pressure as directed. Ask your doctor what your blood pressure should be and when you should contact him or her. As your body makes more red blood cells, you may need to take iron, folic acid, or vitamin B supplements. Ask your doctor or health care provider which products are right for you. If you have kidney disease continue dietary restrictions, even though this medication can make you feel better. Talk with your doctor or health   care professional about the foods you eat and the vitamins that you take. What side effects may I notice from receiving this medicine? Side effects that you should report to your doctor or health care professional as soon as possible: -allergic reactions like skin rash, itching or hives, swelling of the face, lips, or tongue -breathing problems -changes in vision -chest  pain -confusion, trouble speaking or understanding -feeling faint or lightheaded, falls -high blood pressure -muscle aches or pains -pain, swelling, warmth in the leg -rapid weight gain -severe headaches -sudden numbness or weakness of the face, arm or leg -trouble walking, dizziness, loss of balance or coordination -seizures (convulsions) -swelling of the ankles, feet, hands -unusually weak or tired Side effects that usually do not require medical attention (report to your doctor or health care professional if they continue or are bothersome): -diarrhea -fever, chills (flu-like symptoms) -headaches -nausea, vomiting -redness, stinging, or swelling at site where injected This list may not describe all possible side effects. Call your doctor for medical advice about side effects. You may report side effects to FDA at 1-800-FDA-1088. Where should I keep my medicine? Keep out of the reach of children. Store in a refrigerator between 2 and 8 degrees C (36 and 46 degrees F). Do not freeze. Do not shake. Throw away any unused portion if using a single-dose vial. Throw away any unused medicine after the expiration date. NOTE: This sheet is a summary. It may not cover all possible information. If you have questions about this medicine, talk to your doctor, pharmacist, or health care provider.  2015, Elsevier/Gold Standard. (2008-11-08 10:23:57)  

## 2015-03-11 DIAGNOSIS — Z66 Do not resuscitate: Secondary | ICD-10-CM | POA: Diagnosis present

## 2015-03-11 DIAGNOSIS — Z87891 Personal history of nicotine dependence: Secondary | ICD-10-CM | POA: Diagnosis not present

## 2015-03-11 DIAGNOSIS — Z8572 Personal history of non-Hodgkin lymphomas: Secondary | ICD-10-CM | POA: Diagnosis not present

## 2015-03-11 DIAGNOSIS — F0390 Unspecified dementia without behavioral disturbance: Secondary | ICD-10-CM | POA: Diagnosis present

## 2015-03-11 DIAGNOSIS — I129 Hypertensive chronic kidney disease with stage 1 through stage 4 chronic kidney disease, or unspecified chronic kidney disease: Secondary | ICD-10-CM | POA: Diagnosis present

## 2015-03-11 DIAGNOSIS — Z803 Family history of malignant neoplasm of breast: Secondary | ICD-10-CM | POA: Diagnosis not present

## 2015-03-11 DIAGNOSIS — J189 Pneumonia, unspecified organism: Secondary | ICD-10-CM | POA: Diagnosis present

## 2015-03-11 DIAGNOSIS — D61818 Other pancytopenia: Secondary | ICD-10-CM | POA: Diagnosis present

## 2015-03-11 DIAGNOSIS — B9620 Unspecified Escherichia coli [E. coli] as the cause of diseases classified elsewhere: Secondary | ICD-10-CM | POA: Diagnosis present

## 2015-03-11 DIAGNOSIS — R4702 Dysphasia: Secondary | ICD-10-CM | POA: Diagnosis present

## 2015-03-11 DIAGNOSIS — N390 Urinary tract infection, site not specified: Secondary | ICD-10-CM | POA: Diagnosis present

## 2015-03-11 DIAGNOSIS — N189 Chronic kidney disease, unspecified: Secondary | ICD-10-CM | POA: Diagnosis present

## 2015-03-11 DIAGNOSIS — F419 Anxiety disorder, unspecified: Secondary | ICD-10-CM | POA: Diagnosis present

## 2015-03-11 DIAGNOSIS — J9601 Acute respiratory failure with hypoxia: Secondary | ICD-10-CM | POA: Diagnosis not present

## 2015-03-11 DIAGNOSIS — R0902 Hypoxemia: Secondary | ICD-10-CM | POA: Diagnosis not present

## 2015-03-11 DIAGNOSIS — G4733 Obstructive sleep apnea (adult) (pediatric): Secondary | ICD-10-CM | POA: Diagnosis present

## 2015-03-11 DIAGNOSIS — E876 Hypokalemia: Secondary | ICD-10-CM | POA: Diagnosis not present

## 2015-03-11 DIAGNOSIS — Y95 Nosocomial condition: Secondary | ICD-10-CM | POA: Diagnosis present

## 2015-03-11 DIAGNOSIS — E039 Hypothyroidism, unspecified: Secondary | ICD-10-CM | POA: Diagnosis present

## 2015-03-11 DIAGNOSIS — A419 Sepsis, unspecified organism: Secondary | ICD-10-CM | POA: Diagnosis present

## 2015-03-11 DIAGNOSIS — J123 Human metapneumovirus pneumonia: Secondary | ICD-10-CM | POA: Diagnosis not present

## 2015-03-11 DIAGNOSIS — R509 Fever, unspecified: Secondary | ICD-10-CM | POA: Diagnosis present

## 2015-03-11 DIAGNOSIS — I2510 Atherosclerotic heart disease of native coronary artery without angina pectoris: Secondary | ICD-10-CM | POA: Diagnosis present

## 2015-03-11 DIAGNOSIS — Z515 Encounter for palliative care: Secondary | ICD-10-CM | POA: Diagnosis not present

## 2015-03-11 DIAGNOSIS — R6520 Severe sepsis without septic shock: Secondary | ICD-10-CM | POA: Diagnosis present

## 2015-03-11 DIAGNOSIS — H3530 Unspecified macular degeneration: Secondary | ICD-10-CM | POA: Diagnosis present

## 2015-03-11 LAB — BASIC METABOLIC PANEL
Anion gap: 10 (ref 5–15)
BUN: 25 mg/dL — AB (ref 6–23)
CALCIUM: 8 mg/dL — AB (ref 8.4–10.5)
CHLORIDE: 108 mmol/L (ref 96–112)
CO2: 28 mmol/L (ref 19–32)
CREATININE: 1.09 mg/dL (ref 0.50–1.10)
GFR calc Af Amer: 52 mL/min — ABNORMAL LOW (ref >90)
GFR calc non Af Amer: 45 mL/min — ABNORMAL LOW (ref >90)
Glucose, Bld: 116 mg/dL — ABNORMAL HIGH (ref 70–99)
Potassium: 3.5 mmol/L (ref 3.5–5.1)
Sodium: 146 mmol/L — ABNORMAL HIGH (ref 135–145)

## 2015-03-11 LAB — CBC
HEMATOCRIT: 28.1 % — AB (ref 36.0–46.0)
HEMOGLOBIN: 8.6 g/dL — AB (ref 12.0–15.0)
MCH: 27 pg (ref 26.0–34.0)
MCHC: 30.6 g/dL (ref 30.0–36.0)
MCV: 88.1 fL (ref 78.0–100.0)
Platelets: 104 10*3/uL — ABNORMAL LOW (ref 150–400)
RBC: 3.19 MIL/uL — AB (ref 3.87–5.11)
RDW: 16.7 % — ABNORMAL HIGH (ref 11.5–15.5)
WBC: 2.7 10*3/uL — ABNORMAL LOW (ref 4.0–10.5)

## 2015-03-11 LAB — MRSA PCR SCREENING: MRSA by PCR: NEGATIVE

## 2015-03-11 MED ORDER — OSELTAMIVIR PHOSPHATE 75 MG PO CAPS
75.0000 mg | ORAL_CAPSULE | Freq: Two times a day (BID) | ORAL | Status: DC
  Filled 2015-03-11 (×2): qty 1

## 2015-03-11 MED ORDER — IPRATROPIUM-ALBUTEROL 0.5-2.5 (3) MG/3ML IN SOLN
3.0000 mL | RESPIRATORY_TRACT | Status: DC | PRN
  Administered 2015-03-12 (×2): 3 mL via RESPIRATORY_TRACT
  Filled 2015-03-11 (×3): qty 3

## 2015-03-11 MED ORDER — ONDANSETRON HCL 4 MG/2ML IJ SOLN: 4.0000 mg | Freq: Four times a day (QID) | INTRAMUSCULAR | Status: DC | PRN

## 2015-03-11 MED ORDER — ASPIRIN 81 MG PO CHEW: 324.0000 mg | CHEWABLE_TABLET | ORAL | Status: AC

## 2015-03-11 MED ORDER — LEVOTHYROXINE SODIUM 50 MCG PO TABS
50.0000 ug | ORAL_TABLET | Freq: Every day | ORAL | Status: DC
  Administered 2015-03-12 – 2015-03-16 (×5): 50 ug via ORAL
  Filled 2015-03-11 (×5): qty 1

## 2015-03-11 MED ORDER — BUPROPION HCL ER (SR) 100 MG PO TB12
200.0000 mg | ORAL_TABLET | Freq: Two times a day (BID) | ORAL | Status: DC
  Administered 2015-03-12 – 2015-03-15 (×8): 200 mg via ORAL
  Filled 2015-03-11 (×15): qty 2

## 2015-03-11 MED ORDER — DEXTROSE 5 % IV SOLN
1.0000 g | Freq: Two times a day (BID) | INTRAVENOUS | Status: AC
  Administered 2015-03-11 – 2015-03-17 (×12): 1 g via INTRAVENOUS
  Filled 2015-03-11 (×14): qty 1

## 2015-03-11 MED ORDER — ASPIRIN 300 MG RE SUPP
300.0000 mg | RECTAL | Status: AC
  Administered 2015-03-11: 300 mg via RECTAL
  Filled 2015-03-11: qty 1

## 2015-03-11 MED ORDER — DULOXETINE HCL 30 MG PO CPEP
60.0000 mg | ORAL_CAPSULE | Freq: Two times a day (BID) | ORAL | Status: DC
  Administered 2015-03-12 – 2015-03-15 (×7): 60 mg via ORAL
  Filled 2015-03-11 (×8): qty 2

## 2015-03-11 MED ORDER — SODIUM CHLORIDE 0.9 % IV SOLN
250.0000 mL | INTRAVENOUS | Status: DC | PRN
  Administered 2015-03-17: 250 mL via INTRAVENOUS

## 2015-03-11 MED ORDER — CHLORHEXIDINE GLUCONATE 0.12 % MT SOLN
15.0000 mL | Freq: Two times a day (BID) | OROMUCOSAL | Status: DC
  Administered 2015-03-11 – 2015-03-16 (×10): 15 mL via OROMUCOSAL
  Filled 2015-03-11 (×13): qty 15

## 2015-03-11 MED ORDER — FUROSEMIDE 10 MG/ML IJ SOLN
80.0000 mg | Freq: Once | INTRAMUSCULAR | Status: AC
  Administered 2015-03-11: 80 mg via INTRAVENOUS
  Filled 2015-03-11: qty 8

## 2015-03-11 MED ORDER — BISACODYL 10 MG RE SUPP: 10.0000 mg | RECTAL | Status: DC | PRN

## 2015-03-11 MED ORDER — OSELTAMIVIR PHOSPHATE 30 MG PO CAPS
30.0000 mg | ORAL_CAPSULE | Freq: Two times a day (BID) | ORAL | Status: DC
  Administered 2015-03-12 – 2015-03-13 (×3): 30 mg via ORAL
  Filled 2015-03-11 (×5): qty 1

## 2015-03-11 MED ORDER — HEPARIN SODIUM (PORCINE) 5000 UNIT/ML IJ SOLN
5000.0000 [IU] | Freq: Three times a day (TID) | INTRAMUSCULAR | Status: DC
Start: 2015-03-11 — End: 2015-03-17
  Administered 2015-03-11 – 2015-03-17 (×17): 5000 [IU] via SUBCUTANEOUS
  Filled 2015-03-11 (×16): qty 1

## 2015-03-11 MED ORDER — BISOPROLOL FUMARATE 5 MG PO TABS
5.0000 mg | ORAL_TABLET | Freq: Every day | ORAL | Status: DC
  Administered 2015-03-12 – 2015-03-15 (×4): 5 mg via ORAL
  Filled 2015-03-11 (×4): qty 1

## 2015-03-11 MED ORDER — CETYLPYRIDINIUM CHLORIDE 0.05 % MT LIQD
7.0000 mL | Freq: Two times a day (BID) | OROMUCOSAL | Status: DC
  Administered 2015-03-11 – 2015-03-17 (×12): 7 mL via OROMUCOSAL

## 2015-03-11 MED ORDER — LORAZEPAM 2 MG/ML IJ SOLN
0.5000 mg | Freq: Once | INTRAMUSCULAR | Status: AC
  Administered 2015-03-11: 0.5 mg via INTRAVENOUS
  Filled 2015-03-11: qty 1

## 2015-03-11 NOTE — ED Notes (Signed)
Son Shelly Bombard) is leaving; left number to be reached in case of changes in condition or notification of treatment; [phone]

## 2015-03-11 NOTE — ED Notes (Signed)
Respiratory called to assist transporting pt to floor

## 2015-03-11 NOTE — H&P (Signed)
PULMONARY / CRITICAL CARE MEDICINE HISTORY AND PHYSICAL EXAMINATION   Name: Vicki Bauer MRN: [id number] DOB: 05/17/1928    ADMISSION DATE:  03/11/2015  PRIMARY SERVICE: PCCM  CHIEF COMPLAINT:  SOB, hypoxia  BRIEF PATIENT DESCRIPTION: 28yof, ALF resident, presents with worsening fever and SOB, found to be hypoxic and with infiltrates on CXR c/w PNA vs possible flu.    SIGNIFICANT EVENTS / STUDIES:  03/11/15:  Presented to ED, CXR with multifocal patchy infiltrates, worse in LUL  LINES / TUBES: Foley: 04/01/2015-->  CULTURES: BLood cx x 2:  03/12/2015: Pending Urine cx: 04/04/2015: Pending  ANTIBIOTICS: Vanc: 03/25/2015--> Cefepime: 03/21/2015-->  HISTORY OF PRESENT ILLNESS:  86yof with PMH of HTN, CAD, OSA presents with cc of SOB and fevers, found to be hypoxic.  To review, the pt was hospitalized approx 1 mo ago after a fall.  Hx obtained from some via phone as pt unable to provide hx 2/2 AMS.  She was d/c'ed to a SNF and then transferred to an ALF.  Over the past couple of weeks she has had issues with increased SOB and cough.  She was treated with Abx and steroids approx 2 weeks ago with some improvement in symptoms.  However, over the past 2 days, she has had a marked increase in SOB prompting ED presentation.  No known sick contacts or flu contacts but pt is in a facility.  In the ED, pt was started on broad Abx, cultured and placed on Bipap.  PAST MEDICAL HISTORY :  Past Medical History  Diagnosis Date  . Hypertension   . Hypothyroidism   . Macular degeneration   . Cancer     lymphoma  . Insomnia   . OAB (overactive bladder)   . CAD (coronary artery disease) 1990    s/p stent x2,  . Sleep apnea    Past Surgical History  Procedure Laterality Date  . Tonsillectomy and adenoidectomy    . Hysterotomy    . Replacement total knee bilateral    . Lymph gland excision Bilateral   . Cholecystectomy    . Cataract extraction Bilateral   . Appendectomy    . Ptca    . Back surgery      Prior to Admission medications   Medication Sig Start Date End Date Taking? Authorizing Provider  acetaminophen (TYLENOL) 325 MG tablet Take 650 mg by mouth every 6 (six) hours as needed for mild pain.   Yes Historical Provider, MD  albuterol (PROVENTIL) (2.5 MG/3ML) 0.083% nebulizer solution Take 3 mLs (2.5 mg total) by nebulization every 2 (two) hours as needed for wheezing or shortness of breath (first dose now please). 02/17/15  Yes Simbiso Ranga, MD  bisacodyl (DULCOLAX) 10 MG suppository Place 10 mg rectally as needed (constipation not relieved by milk of magnesia.).   Yes Historical Provider, MD  bisoprolol (ZEBETA) 5 MG tablet Take 5 mg by mouth daily.   Yes Historical Provider, MD  buPROPion (WELLBUTRIN SR) 200 MG 12 hr tablet Take 200 mg by mouth 2 (two) times daily.   Yes Historical Provider, MD  DULoxetine (CYMBALTA) 60 MG capsule Take 60 mg by mouth 2 (two) times daily.   Yes Historical Provider, MD  ferrous sulfate 325 (65 FE) MG tablet Take 325 mg by mouth daily with breakfast.   Yes Historical Provider, MD  furosemide (LASIX) 40 MG tablet Take 0.5 tablets (20 mg total) by mouth daily. Patient taking differently: Take 40 mg by mouth daily.  02/17/15  Yes Simbiso  Sanjuana Letters, MD  guaiFENesin (MUCINEX) 600 MG 12 hr tablet Take 2 tablets (1,200 mg total) by mouth 2 (two) times daily. 02/17/15  Yes Simbiso Ranga, MD  levothyroxine (SYNTHROID, LEVOTHROID) 50 MCG tablet Take 50 mcg by mouth daily before breakfast.   Yes Historical Provider, MD  LORazepam (ATIVAN) 0.5 MG tablet Take 0.5 mg by mouth 2 (two) times daily as needed for anxiety.   Yes Historical Provider, MD  LORazepam (ATIVAN) 1 MG tablet Take 1 tablet (1 mg total) by mouth at bedtime. 02/17/15  Yes Simbiso Ranga, MD  LORazepam (ATIVAN) 2 MG/ML injection Inject 1 mg into the vein every 6 (six) hours as needed for anxiety or sedation.   Yes Historical Provider, MD  magnesium hydroxide (MILK OF MAGNESIA) 400 MG/5ML suspension Take 30 mLs  by mouth as needed (if no bowel movement in 3 days.).   Yes Historical Provider, MD  methylPREDNISolone sodium succinate (SOLU-MEDROL) 125 mg/2 mL injection Inject 62.5 mg into the muscle once.    Yes Historical Provider, MD  moxifloxacin (AVELOX) 400 MG tablet Take 400 mg by mouth daily at 8 pm.   Yes Historical Provider, MD  Multiple Vitamins-Minerals (CENTRUM SILVER ADULT 50+) TABS Take 1 tablet by mouth daily.   Yes Historical Provider, MD  OXYGEN Inhale 2 L into the lungs as needed (to maintain O2 saturation of 90%).   Yes Historical Provider, MD  potassium chloride SA (K-DUR,KLOR-CON) 20 MEQ tablet Take 20 mEq by mouth daily.   Yes Historical Provider, MD  predniSONE (DELTASONE) 10 MG tablet Please take 3 tablets daily for 3 days then 2 tablets daily for 2 days then 1 tablet daily for 2 days. Total 15 tablets 02/17/15  Yes Simbiso Ranga, MD  pregabalin (LYRICA) 75 MG capsule Take 1 capsule (75 mg total) by mouth daily. 02/17/15  Yes Simbiso Ranga, MD  saccharomyces boulardii (FLORASTOR) 250 MG capsule Take 250 mg by mouth 2 (two) times daily. For 10 days.   Yes Historical Provider, MD  Simethicone (GAS-X ULTRA STRENGTH) 180 MG CAPS Take 180 mg by mouth at bedtime.   Yes Historical Provider, MD  Sodium Phosphates (RA SALINE ENEMA RE) Place 1 enema rectally once as needed (constipation not relieved by bisacodyl. If no result, contact MD.).   Yes Historical Provider, MD  Zinc 100 MG TABS Take 1 tablet by mouth every evening.    Yes Historical Provider, MD  levothyroxine (SYNTHROID, LEVOTHROID) 25 MCG tablet Take 25 mcg by mouth daily before breakfast.    Historical Provider, MD  Oxycodone HCl 10 MG TABS Take 1 tablet (10 mg total) by mouth every 4 (four) hours as needed (pain). Patient not taking: Reported on 03/24/2015 02/17/15   Nat Math, MD   Allergies  Allergen Reactions  . Demerol [Meperidine] Other (See Comments)    Turned patient blue and couldn't breathe  . Morphine And Related Other  (See Comments)    Turned patient blue and couldn't breathe   . Macrolides And Ketolides Nausea Only    FAMILY HISTORY:  Family History  Problem Relation Age of Onset  . Arthritis Mother   . Thyroid disease Son   . Breast cancer Sister    SOCIAL HISTORY:  reports that she quit smoking about 41 years ago. Her smoking use included Cigarettes. She smoked 1.00 pack per day. She has never used smokeless tobacco. She reports that she does not drink alcohol or use illicit drugs.  REVIEW OF SYSTEMS:  Unable to obtain as pt is  altered  SUBJECTIVE: Unable to obtain as pt is altered  VITAL SIGNS: Temp:  [97.4 F (36.3 C)-103.2 F (39.6 C)] 101.3 F (38.5 C) (04/02 0003) Pulse Rate:  [80-99] 85 (04/02 0230) Resp:  [16-31] 27 (04/02 0230) BP: (120-179)/(59-162) 148/82 mmHg (04/02 0230) SpO2:  [74 %-100 %] 100 % (04/02 0230) Weight:  [168 lb (76.204 kg)] 168 lb (76.204 kg) (04/01 1956) HEMODYNAMICS:   VENTILATOR SETTINGS:   INTAKE / OUTPUT: Intake/Output      04/01 0701 - 04/02 0700   I.V. (mL/kg) 2250 (29.5)   Total Intake(mL/kg) 2250 (29.5)   Net +2250         PHYSICAL EXAMINATION: General:  Altered, responds to voice and painful stimuli, increased WOB, on bipap Neuro: Altered, responds to voice and painful stimuli, nonfocal HEENT:  PERR Cardiovascular: RRR, no murmurs Lungs:  Diffuse coarse crackles, increased WOB Abdomen:  +BS, NTTP Musculoskeletal:  No edema Skin:  Warm, patchy erythematous rash on BLE  LABS:  CBC  Recent Labs Lab 03/20/2015 0957 03/21/2015 2011  WBC 3.3* 3.0*  HGB 9.1* 8.9*  HCT 30.5* 30.0*  PLT 110* 99*   Coag's No results for input(s): APTT, INR in the last 168 hours. BMET  Recent Labs Lab 04/02/2015 2011  NA 144  K 3.9  CL 106  CO2 26  BUN 21  CREATININE 1.23*  GLUCOSE 156*   Electrolytes  Recent Labs Lab 03/25/2015 2011  CALCIUM 8.2*   Sepsis Markers  Recent Labs Lab 03/31/2015 2029  LATICACIDVEN 1.46   ABG  Recent  Labs Lab 04/06/2015 2140  PHART 7.347*  PCO2ART 47.2*  PO2ART 66.3*   Liver Enzymes  Recent Labs Lab 04/06/2015 2011  AST 59*  ALT 21  ALKPHOS 83  BILITOT 0.6  ALBUMIN 3.1*   Cardiac Enzymes No results for input(s): TROPONINI, PROBNP in the last 168 hours. Glucose No results for input(s): GLUCAP in the last 168 hours.  Imaging Dg Chest Port 1 View  03/27/2015   CLINICAL DATA:  Fever, shortness of breath, history of lymphoma  EXAM: PORTABLE CHEST - 1 VIEW  COMPARISON:  02/15/2015  FINDINGS: Multifocal patchy opacities, left upper lobe predominant, suspicious for pneumonia.  No pleural effusion or pneumothorax.  Cardiomegaly.  IMPRESSION: Multifocal patchy opacities, left upper lobe predominant, suspicious for pneumonia.   Electronically Signed   By: Julian Hy M.D.   On: 04/02/2015 21:10    EKG: Sinus, normal rate, no ST changes CXR: multifocal patchy infiltrates, worse in LUL  ASSESSMENT / PLAN:  Active Problems:   HCAP (healthcare-associated pneumonia)   PULMONARY A: Hypoxemic respiratory failure from HCAP vs viral PNA, flu is possible given high fever P:   Sputum and blood cx Vanc/ cefepime for HCAP Tamiflu given clinical suspicion for flu Obtaining resp viral panel Droplet precautions Bipap o/n.  Will try [place] vs [facility] in am No further steroids as pt moves air well and her picture is more c/w acute infection  CARDIOVASCULAR A: H/o HTN P:   H/o home BB in setting of sepsis for now Hold home lasix  RENAL A: CKD, unclear baseline.  CR today lower than prior values P:   Monitor UOP and Cr  GASTROINTESTINAL A: No acute issues P:   NPO for now given AMS  HEMATOLOGIC A: Pancytopenia, chronic H/o lymphoma P:   Monitor CBC Transfuse for Hg<7  INFECTIOUS A: Sepsis 2/2 HCAP vs viral infection P:   Abx and cx as above  ENDOCRINE A: Hypothyroidism P:  Cont home synthroid  NEUROLOGIC A: AMS c/w delirium ??Dementia:  Son endorses recent  memory issues P:   Minimize sedating meds Hold benzos in setting of delirium. Pt received one dose 0.5mg  Ativan in ED Hold home lyrica Cont duloxetine and wellbutrin OOB as tolerated Lights on during the day  BEST PRACTICE / DISPOSITION Level of Care:  ICU Primary Service:  PCCM Consultants:  None Code Status:  Ok to intubate but no CPR per son Diet:  NPO DVT Px:  SQH GI Px:  PPI Skin Integrity:  OOB as tolerated Social / Family:  Son Music therapist) update via phone.  His number is [phone]  TODAY'S SUMMARY: Admitted with HCAP vs flu and hypoxemic resp failure.  Getting broad ABX, cultures, viral swab.  Bipap o/n.  Also delirious in setting of acute illness and multiple sedating meds at baseline.    I have personally obtained a history, examined the patient, evaluated laboratory and imaging results, formulated the assessment and plan and placed orders.  CRITICAL CARE: The patient is critically ill with multiple organ systems failure and requires high complexity decision making for assessment and support, frequent evaluation and titration of therapies, application of advanced monitoring technologies and extensive interpretation of multiple databases. Critical Care Time devoted to patient care services described in this note is 45 minutes.   Lucrezia Starch, MD Pulmonary and [place] Pager: [phone]   03/11/2015, 3:31 AM

## 2015-03-12 DIAGNOSIS — J189 Pneumonia, unspecified organism: Secondary | ICD-10-CM

## 2015-03-12 LAB — CLOSTRIDIUM DIFFICILE BY PCR: Toxigenic C. Difficile by PCR: NEGATIVE

## 2015-03-12 LAB — INFLUENZA PANEL BY PCR (TYPE A & B)
H1N1 flu by pcr: NOT DETECTED
INFLBPCR: NEGATIVE
Influenza A By PCR: NEGATIVE

## 2015-03-12 MED ORDER — LORAZEPAM 2 MG/ML IJ SOLN
0.5000 mg | Freq: Once | INTRAMUSCULAR | Status: AC
  Administered 2015-03-12: 0.5 mg via INTRAVENOUS
  Filled 2015-03-12: qty 1

## 2015-03-12 MED ORDER — LORAZEPAM 0.5 MG PO TABS
0.5000 mg | ORAL_TABLET | Freq: Two times a day (BID) | ORAL | Status: DC | PRN
  Administered 2015-03-12 – 2015-03-18 (×7): 0.5 mg via ORAL
  Filled 2015-03-12 (×7): qty 1

## 2015-03-12 MED ORDER — LABETALOL HCL 5 MG/ML IV SOLN
10.0000 mg | Freq: Once | INTRAVENOUS | Status: AC
  Administered 2015-03-12: 10 mg via INTRAVENOUS
  Filled 2015-03-12: qty 4

## 2015-03-12 MED ORDER — PREGABALIN 75 MG PO CAPS
75.0000 mg | ORAL_CAPSULE | Freq: Every day | ORAL | Status: DC
  Administered 2015-03-12 – 2015-03-15 (×4): 75 mg via ORAL
  Filled 2015-03-12 (×4): qty 1

## 2015-03-12 MED ORDER — LORAZEPAM 0.5 MG PO TABS
0.5000 mg | ORAL_TABLET | Freq: Every day | ORAL | Status: DC
  Administered 2015-03-12 – 2015-03-15 (×4): 0.5 mg via ORAL
  Filled 2015-03-12 (×4): qty 1

## 2015-03-12 NOTE — Progress Notes (Signed)
PULMONARY / CRITICAL CARE MEDICINE HISTORY AND PHYSICAL EXAMINATION   Name: Vicki Bauer MRN: [id number] DOB: 04/25/1928    ADMISSION DATE:  03/11/2015  PRIMARY SERVICE: PCCM  CHIEF COMPLAINT:  SOB, hypoxia  BRIEF PATIENT DESCRIPTION: 68yof, ALF resident, presents with worsening fever and SOB, found to be hypoxic and with infiltrates on CXR c/w PNA vs possible flu.    SIGNIFICANT EVENTS / STUDIES:  03/11/15:  Presented to ED, CXR with multifocal patchy infiltrates, worse in LUL  LINES / TUBES: Foley: 03/13/2015-->  CULTURES: BLood cx x 2:  04/07/2015: Pending Urine cx: 03/23/2015: Pending  ANTIBIOTICS: Vanc: 03/15/2015--> Cefepime: 04/06/2015-->  HISTORY OF PRESENT ILLNESS:  86yof with PMH of HTN, CAD, OSA presents with cc of SOB and fevers, found to be hypoxic.  To review, the pt was hospitalized approx 1 mo ago after a fall.  Hx obtained from some via phone as pt unable to provide hx 2/2 AMS.  She was d/c'ed to a SNF and then transferred to an ALF.  Over the past couple of weeks she has had issues with increased SOB and cough.  She was treated with Abx and steroids approx 2 weeks ago with some improvement in symptoms.  However, over the past 2 days, she has had a marked increase in SOB prompting ED presentation.  No known sick contacts or flu contacts but pt is in a facility.  In the ED, pt was started on broad Abx, cultured and placed on Bipap.   SUBJECTIVE:  Wore BiPAP last night Feeling much better this am  Coughing   VITAL SIGNS: Temp:  [97.3 F (36.3 C)-99.1 F (37.3 C)] 99.1 F (37.3 C) (04/03 0800) Pulse Rate:  [65-115] 100 (04/03 1000) Resp:  [12-33] 27 (04/03 1000) BP: (128-196)/(40-118) 196/82 mmHg (04/03 1000) SpO2:  [88 %-100 %] 88 % (04/03 1000) FiO2 (%):  [45 %] 45 % (04/02 2254) Weight:  [75.5 kg (166 lb 7.2 oz)-75.6 kg (166 lb 10.7 oz)] 75.6 kg (166 lb 10.7 oz) (04/03 0440) HEMODYNAMICS:   VENTILATOR SETTINGS: Vent Mode:  [-]  FiO2 (%):  [45 %] 45 % INTAKE /  OUTPUT: Intake/Output      04/02 0701 - 04/03 0700 04/03 0701 - 04/04 0700   I.V. (mL/kg) 180 (2.4) 0 (0)   IV Piggyback 300    Total Intake(mL/kg) 480 (6.3) 0 (0)   Net +480 0        Urine Occurrence 6 x    Stool Occurrence 1 x      PHYSICAL EXAMINATION: General:  Comfortable, awake Neuro: Awake and alert, oriented, following commands, non-focal HEENT:  PERR Cardiovascular: RRR, no murmurs Lungs:  B insp crackles, no wheeze Abdomen:  +BS Musculoskeletal:  No edema Skin:  Warm, patchy erythematous rash on BLE  LABS:  CBC  Recent Labs Lab 03/19/2015 0957 03/13/2015 2011 03/11/15 1215  WBC 3.3* 3.0* 2.7*  HGB 9.1* 8.9* 8.6*  HCT 30.5* 30.0* 28.1*  PLT 110* 99* 104*   Coag's No results for input(s): APTT, INR in the last 168 hours. BMET  Recent Labs Lab 03/11/2015 2011 03/11/15 1215  NA 144 146*  K 3.9 3.5  CL 106 108  CO2 26 28  BUN 21 25*  CREATININE 1.23* 1.09  GLUCOSE 156* 116*   Electrolytes  Recent Labs Lab 03/19/2015 2011 03/11/15 1215  CALCIUM 8.2* 8.0*   Sepsis Markers  Recent Labs Lab 03/11/2015 2029  LATICACIDVEN 1.46   ABG  Recent Labs Lab 03/22/2015 2140  PHART 7.347*  PCO2ART 47.2*  PO2ART 66.3*   Liver Enzymes  Recent Labs Lab 04/06/2015 2011  AST 59*  ALT 21  ALKPHOS 83  BILITOT 0.6  ALBUMIN 3.1*   Cardiac Enzymes No results for input(s): TROPONINI, PROBNP in the last 168 hours. Glucose No results for input(s): GLUCAP in the last 168 hours.  Imaging Dg Chest Port 1 View  03/26/2015   CLINICAL DATA:  Fever, shortness of breath, history of lymphoma  EXAM: PORTABLE CHEST - 1 VIEW  COMPARISON:  02/15/2015  FINDINGS: Multifocal patchy opacities, left upper lobe predominant, suspicious for pneumonia.  No pleural effusion or pneumothorax.  Cardiomegaly.  IMPRESSION: Multifocal patchy opacities, left upper lobe predominant, suspicious for pneumonia.   Electronically Signed   By: Julian Hy M.D.   On: 03/12/2015 21:10     EKG: Sinus, normal rate, no ST changes CXR: multifocal patchy infiltrates, worse in LUL  ASSESSMENT / PLAN:  Active Problems:   HCAP (healthcare-associated pneumonia)   PULMONARY A: Hypoxemic respiratory failure from HCAP vs viral PNA P:   Sputum and blood cx pending Vanc/ cefepime for HCAP Tamiflu given clinical suspicion for flu Obtaining resp viral panel, will also send flu swab today Droplet precautions Bipap, change to prn.  [place] o2 No further steroids as pt moves air well and her picture is more c/w acute infection  CARDIOVASCULAR A: H/o HTN P:   H/o home BB in setting of sepsis for now Hold home lasix, likely restart 4/4  RENAL A: CKD, unclear baseline.   P:   Monitor UOP and Cr  GASTROINTESTINAL A: No acute issues P:   Start clear diet  HEMATOLOGIC A: Pancytopenia, chronic H/o lymphoma P:   Monitor CBC Transfuse for Hg<7  INFECTIOUS A: Sepsis 2/2 HCAP vs viral infection P:   Abx and cx as above  ENDOCRINE A: Hypothyroidism P:   Cont home synthroid  NEUROLOGIC A: AMS c/w delirium ??Dementia:  Son endorses recent memory issues P:   Minimize sedating meds Hold benzos in setting of delirium.  Hold home lyrica Cont duloxetine and wellbutrin OOB as tolerated Lights on during the day   Social / Family:  Son Music therapist) update via phone.  His number is [phone] Intubation OK short term, no CPR    Baltazar Apo, MD, PhD 03/12/2015, 10:39 AM [place] [facility] [phone] or if no answer [phone]

## 2015-03-12 NOTE — Progress Notes (Signed)
eLink Physician-Brief Progress Note Patient Name: Vicki Bauer DOB: 1928/08/22 MRN: [id number]   Date of Service  03/12/2015  HPI/Events of Note  Patient with moderate confusion and agitation. Blinds are closed in the room and light are off  eICU Interventions  0.5 mg IV ativan Open blinds, keeps lights on during the day Reorientation.     Intervention Category Intermediate Interventions: Other:  [name] 03/12/2015, 4:05 PM

## 2015-03-13 ENCOUNTER — Telehealth: Payer: Self-pay | Admitting: *Deleted

## 2015-03-13 ENCOUNTER — Other Ambulatory Visit: Payer: Self-pay | Admitting: *Deleted

## 2015-03-13 ENCOUNTER — Telehealth: Payer: Self-pay | Admitting: Hematology & Oncology

## 2015-03-13 DIAGNOSIS — D61818 Other pancytopenia: Secondary | ICD-10-CM

## 2015-03-13 LAB — BASIC METABOLIC PANEL
ANION GAP: 9 (ref 5–15)
BUN: 22 mg/dL (ref 6–23)
CO2: 30 mmol/L (ref 19–32)
Calcium: 7.9 mg/dL — ABNORMAL LOW (ref 8.4–10.5)
Chloride: 106 mmol/L (ref 96–112)
Creatinine, Ser: 0.94 mg/dL (ref 0.50–1.10)
GFR calc Af Amer: 63 mL/min — ABNORMAL LOW (ref >90)
GFR, EST NON AFRICAN AMERICAN: 54 mL/min — AB (ref >90)
GLUCOSE: 89 mg/dL (ref 70–99)
Potassium: 3 mmol/L — ABNORMAL LOW (ref 3.5–5.1)
Sodium: 145 mmol/L (ref 135–145)

## 2015-03-13 LAB — URINE CULTURE: Colony Count: >=100000

## 2015-03-13 LAB — VANCOMYCIN, TROUGH: Vancomycin Tr: 15.6 ug/mL (ref 10.0–20.0)

## 2015-03-13 MED ORDER — POTASSIUM CHLORIDE 20 MEQ/15ML (10%) PO SOLN
40.0000 meq | Freq: Three times a day (TID) | ORAL | Status: AC
  Administered 2015-03-13 (×2): 40 meq
  Filled 2015-03-13 (×2): qty 30

## 2015-03-13 MED ORDER — LOPERAMIDE HCL 2 MG PO CAPS
2.0000 mg | ORAL_CAPSULE | ORAL | Status: DC | PRN
  Administered 2015-03-13: 2 mg via ORAL
  Administered 2015-03-13: 4 mg via ORAL
  Filled 2015-03-13 (×3): qty 1

## 2015-03-13 MED ORDER — PREDNISONE 20 MG PO TABS
40.0000 mg | ORAL_TABLET | Freq: Every day | ORAL | Status: DC
  Administered 2015-03-14 – 2015-03-15 (×2): 40 mg via ORAL
  Filled 2015-03-13 (×2): qty 2

## 2015-03-13 MED ORDER — FUROSEMIDE 40 MG PO TABS
40.0000 mg | ORAL_TABLET | Freq: Every day | ORAL | Status: DC
  Administered 2015-03-13: 40 mg via ORAL
  Filled 2015-03-13: qty 1

## 2015-03-13 MED ORDER — IPRATROPIUM-ALBUTEROL 0.5-2.5 (3) MG/3ML IN SOLN
3.0000 mL | Freq: Four times a day (QID) | RESPIRATORY_TRACT | Status: DC
  Administered 2015-03-13 – 2015-03-17 (×16): 3 mL via RESPIRATORY_TRACT
  Filled 2015-03-13 (×16): qty 3

## 2015-03-13 NOTE — Telephone Encounter (Signed)
Called to schedule iron per in basket, per Son she is in the hospital, RN aware will let MD know

## 2015-03-13 NOTE — Telephone Encounter (Signed)
-----   Message from Volanda Napoleon, MD sent at 03/13/2015  6:12 PM EDT ----- Call - her iron is very low!!  Need feraheme 510mg  x 2 doses. plese set up!!  pete

## 2015-03-13 NOTE — Progress Notes (Signed)
ANTIBIOTIC CONSULT NOTE - Follow Up  Pharmacy Consult for Vancomycin/cefepime Indication: rule out pneumonia  Allergies  Allergen Reactions  . Demerol [Meperidine] Other (See Comments)    Turned patient blue and couldn't breathe  . Morphine And Related Other (See Comments)    Turned patient blue and couldn't breathe   . Macrolides And Ketolides Nausea Only    Patient Measurements: Height: 5\' 2"  (157.5 cm) Weight: 161 lb 13.1 oz (73.4 kg) IBW/kg (Calculated) : 50.1  Vital Signs: Temp: 98.8 F (37.1 C) (04/04 1200) Temp Source: Oral (04/04 1200) BP: 166/65 mmHg (04/04 1400) Pulse Rate: 80 (04/04 1400) Intake/Output from previous day: 04/03 0701 - 04/04 0700 In: 540 [P.O.:240; IV Piggyback:300] Out: 100 [Urine:100] Intake/Output from this shift: Total I/O In: 50 [IV Piggyback:50] Out: -   Labs:  Recent Labs  03/12/2015 2011 03/11/15 1215 03/13/15 0411  WBC 3.0* 2.7*  --   HGB 8.9* 8.6*  --   PLT 99* 104*  --   CREATININE 1.23* 1.09 0.94   Estimated Creatinine Clearance: 41.8 mL/min (by C-G formula based on Cr of 0.94). No results for input(s): VANCOTROUGH, VANCOPEAK, VANCORANDOM, GENTTROUGH, GENTPEAK, GENTRANDOM, TOBRATROUGH, TOBRAPEAK, TOBRARND, AMIKACINPEAK, AMIKACINTROU, AMIKACIN in the last 72 hours.   Microbiology: Recent Results (from the past 720 hour(s))  Clostridium Difficile by PCR     Status: None   Collection Time: 02/11/15  6:50 PM  Result Value Ref Range Status   C difficile by pcr NEGATIVE NEGATIVE Final  Culture, blood (routine x 2)     Status: None (Preliminary result)   Collection Time: 03/21/2015  8:11 PM  Result Value Ref Range Status   Specimen Description BLOOD RIGHT HAND  Final   Special Requests BOTTLES DRAWN AEROBIC AND ANAEROBIC 5ML  Final   Culture   Final           BLOOD CULTURE RECEIVED NO GROWTH TO DATE CULTURE WILL BE HELD FOR 5 DAYS BEFORE ISSUING A FINAL NEGATIVE REPORT Performed at [facility]    Report Status PENDING   Incomplete  Culture, blood (routine x 2)     Status: None (Preliminary result)   Collection Time: 03/26/2015  8:11 PM  Result Value Ref Range Status   Specimen Description BLOOD RIGHT HAND  Final   Special Requests BOTTLES DRAWN AEROBIC AND ANAEROBIC 5ML  Final   Culture   Final           BLOOD CULTURE RECEIVED NO GROWTH TO DATE CULTURE WILL BE HELD FOR 5 DAYS BEFORE ISSUING A FINAL NEGATIVE REPORT Performed at [facility]    Report Status PENDING  Incomplete  Urine culture     Status: None   Collection Time: 04/02/2015  8:45 PM  Result Value Ref Range Status   Specimen Description URINE, RANDOM  Final   Special Requests NONE  Final   Colony Count   Final    >=100,000 COLONIES/ML Performed at [facility]    Culture   Final    ESCHERICHIA COLI Performed at [facility]    Report Status 03/13/2015 FINAL  Final   Organism ID, Bacteria ESCHERICHIA COLI  Final      Susceptibility   Escherichia coli - MIC*    AMPICILLIN >=32 RESISTANT Resistant     CEFAZOLIN <=4 SENSITIVE Sensitive     CEFTRIAXONE <=1 SENSITIVE Sensitive     CIPROFLOXACIN >=4 RESISTANT Resistant     GENTAMICIN <=1 SENSITIVE Sensitive     LEVOFLOXACIN >=8 RESISTANT  Resistant     NITROFURANTOIN <=16 SENSITIVE Sensitive     TOBRAMYCIN <=1 SENSITIVE Sensitive     TRIMETH/SULFA <=20 SENSITIVE Sensitive     PIP/TAZO 8 SENSITIVE Sensitive     * ESCHERICHIA COLI  MRSA PCR Screening     Status: None   Collection Time: 03/11/15 10:49 AM  Result Value Ref Range Status   MRSA by PCR NEGATIVE NEGATIVE Final    Comment:        The GeneXpert MRSA Assay (FDA approved for NASAL specimens only), is one component of a comprehensive MRSA colonization surveillance program. It is not intended to diagnose MRSA infection nor to guide or monitor treatment for MRSA infections.   Clostridium Difficile by PCR     Status: None   Collection Time: 03/12/15  8:40 PM  Result Value Ref Range Status   C  difficile by pcr NEGATIVE NEGATIVE Final    Medical History: Past Medical History  Diagnosis Date  . Hypertension   . Hypothyroidism   . Macular degeneration   . Cancer     lymphoma  . Insomnia   . OAB (overactive bladder)   . CAD (coronary artery disease) 1990    s/p stent x2,  . Sleep apnea     Medications:  Scheduled:  . antiseptic oral rinse  7 mL Mouth Rinse q12n4p  . bisoprolol  5 mg Oral Daily  . buPROPion  200 mg Oral BID  . ceFEPime (MAXIPIME) IV  1 g Intravenous Q12H  . chlorhexidine  15 mL Mouth Rinse BID  . DULoxetine  60 mg Oral BID  . furosemide  40 mg Oral Daily  . heparin  5,000 Units Subcutaneous 3 times per day  . ipratropium-albuterol  3 mL Nebulization QID  . levothyroxine  50 mcg Oral QAC breakfast  . LORazepam  0.5 mg Oral QHS  . potassium chloride  40 mEq Per Tube TID  . [START ON 03/14/2015] predniSONE  40 mg Oral Q breakfast  . pregabalin  75 mg Oral Daily  . vancomycin  1,000 mg Intravenous Q24H   Infusions:    PRN: sodium chloride, acetaminophen, bisacodyl, loperamide, LORazepam, ondansetron (ZOFRAN) IV  Assessment: 81 yo female ALF resident brought to ED with SOB and fever, recently treated for pneumonia with avelox and steroids.  Patient has been ordered one dose of cefepime and pharmacy is consulted to dose vancomycin for pneumonia.  Cefepime per pharmacy ordered for UTI.  4/1 >> vanc >> 4/1 >> cefepime >> 4/1 >> Tamiflu >>  Tmax: improved to afebrile (initially 103.2) WBC: 2.7 Renal: SCr 0.94 (improved), CrCl 50 CG/N  4/1 blood x 2: ngtd 4/1 urine: > 100k Ecoli 4/3 Cdiff neg 4/2 resp virus panel IP 4/3 influenza panel neg  Today is day #4 Vanc 1g q24h, cefepime 1gm iv q12hr for pneumonia and Ecoli UTI. Tamfilu adjusted to 30mg  BID. Get VT tonight if continued (would consider narrowing), improving SCr - may need q12h dosing. F/u sensitivities on E.coli cx.  Goal of Therapy:  Vancomycin trough level 15-20 mcg/ml  Cefepime  dosed based on patient weight and renal function   Plan:  Check vancomycin trough tonight.  Would consider narrowing. Continue Cefepime 1g IV q12h.  F/u E.coli culture sensitivity results.  Hershal Coria, PharmD, BCPS Pager: [phone] 03/13/2015 3:30 PM

## 2015-03-13 NOTE — Progress Notes (Signed)
ANTIBIOTIC CONSULT NOTE - Follow Up  Pharmacy Consult for Vancomycin/cefepime Indication: rule out pneumonia   Assessment: 81 yo female ALF resident brought to ED with SOB and fever, recently treated for pneumonia with avelox and steroids.  Patient has been ordered one dose of cefepime and pharmacy is consulted to dose vancomycin for pneumonia.  Cefepime per pharmacy ordered for UTI.  Renal: SCr 0.94 (improved), CrCl 50 CG/N Vancomycin trough level = 15.6, therapeutic  Goal of Therapy:  Vancomycin trough level 15-20 mcg/ml  Appropriate abx dosing, eradication of infection.   Plan:  Continue Vancomycin 1g IV q24h.  Gretta Arab PharmD, BCPS Pager [phone] 03/13/2015 8:22 PM

## 2015-03-13 NOTE — Progress Notes (Signed)
PULMONARY / CRITICAL CARE MEDICINE HISTORY AND PHYSICAL EXAMINATION   Name: Vicki Bauer MRN: [id number] DOB: May 01, 1928    ADMISSION DATE:  03/28/2015  PRIMARY SERVICE: PCCM  CHIEF COMPLAINT:  SOB, hypoxia  BRIEF PATIENT DESCRIPTION: 56yof, ALF resident, presents with worsening fever and SOB, found to be hypoxic and with infiltrates on CXR c/w PNA vs possible flu.    SIGNIFICANT EVENTS / STUDIES:  03/11/15:  Presented to ED, CXR with multifocal patchy infiltrates, worse in LUL  LINES / TUBES: Foley: 04/06/2015-->  CULTURES: BLood cx x 2:  03/25/2015: Pending Urine cx: 04/01/2015: Pending  ANTIBIOTICS: Vanc: 03/13/2015--> Cefepime: 03/15/2015-->  HISTORY OF PRESENT ILLNESS:  86yof with PMH of HTN, CAD, OSA presents with cc of SOB and fevers, found to be hypoxic.  To review, the pt was hospitalized approx 1 mo ago after a fall.  Hx obtained from some via phone as pt unable to provide hx 2/2 AMS.  She was d/c'ed to a SNF and then transferred to an ALF.  Over the past couple of weeks she has had issues with increased SOB and cough.  She was treated with Abx and steroids approx 2 weeks ago with some improvement in symptoms.  However, over the past 2 days, she has had a marked increase in SOB prompting ED presentation.  No known sick contacts or flu contacts but pt is in a facility.  In the ED, pt was started on broad Abx, cultured and placed on Bipap.   SUBJECTIVE:  Wore BiPAP again last night (on CPAP at home) Coughing  Having diarrhea, C diff was negative  VITAL SIGNS: Temp:  [98 F (36.7 C)-98.3 F (36.8 C)] 98.2 F (36.8 C) (04/04 0800) Pulse Rate:  [77-95] 85 (04/04 1000) Resp:  [20-33] 28 (04/04 1000) BP: (146-202)/(46-100) 184/81 mmHg (04/04 1000) SpO2:  [85 %-100 %] 98 % (04/04 1000) FiO2 (%):  [40 %-45 %] 40 % (04/04 0301) Weight:  [73.4 kg (161 lb 13.1 oz)] 73.4 kg (161 lb 13.1 oz) (04/04 0500) HEMODYNAMICS:   VENTILATOR SETTINGS: Vent Mode:  [-]  FiO2 (%):  [40 %-45 %] 40  % INTAKE / OUTPUT: Intake/Output      04/03 0701 - 04/04 0700 04/04 0701 - 04/05 0700   P.O. 240    I.V. (mL/kg) 0 (0)    IV Piggyback 300 50   Total Intake(mL/kg) 540 (7.4) 50 (0.7)   Urine (mL/kg/hr) 100 (0.1)    Stool 0 (0)    Total Output 100     Net +440 +50        Urine Occurrence 11 x 4 x   Stool Occurrence 6 x 2 x     PHYSICAL EXAMINATION: General:  Comfortable, awake Neuro: Awake and alert, oriented, following commands, non-focal HEENT:  PERR Cardiovascular: RRR, no murmurs Lungs:  B insp crackles, Coarse B exp wheezes Abdomen:  +BS Musculoskeletal:  No edema Skin:  Warm, patchy erythematous rash on BLE  LABS:  CBC  Recent Labs Lab 03/14/2015 0957 03/23/2015 2011 03/11/15 1215  WBC 3.3* 3.0* 2.7*  HGB 9.1* 8.9* 8.6*  HCT 30.5* 30.0* 28.1*  PLT 110* 99* 104*   Coag's No results for input(s): APTT, INR in the last 168 hours. BMET  Recent Labs Lab 03/11/2015 2011 03/11/15 1215 03/13/15 0411  NA 144 146* 145  K 3.9 3.5 3.0*  CL 106 108 106  CO2 26 28 30   BUN 21 25* 22  CREATININE 1.23* 1.09 0.94  GLUCOSE 156* 116*  89   Electrolytes  Recent Labs Lab 04/06/2015 2011 03/11/15 1215 03/13/15 0411  CALCIUM 8.2* 8.0* 7.9*   Sepsis Markers  Recent Labs Lab 03/11/2015 2029  LATICACIDVEN 1.46   ABG  Recent Labs Lab 03/19/2015 2140  PHART 7.347*  PCO2ART 47.2*  PO2ART 66.3*   Liver Enzymes  Recent Labs Lab 03/17/2015 2011  AST 59*  ALT 21  ALKPHOS 83  BILITOT 0.6  ALBUMIN 3.1*   Cardiac Enzymes No results for input(s): TROPONINI, PROBNP in the last 168 hours. Glucose No results for input(s): GLUCAP in the last 168 hours.  Imaging No results found.  EKG: Sinus, normal rate, no ST changes CXR: multifocal patchy infiltrates, worse in LUL  ASSESSMENT / PLAN:  Active Problems:   HCAP (healthcare-associated pneumonia)   PULMONARY A: Hypoxemic respiratory failure from HCAP vs viral PNA P:   Sputum and blood cx pending Vanc/  cefepime for HCAP Stop Tamiflu Waiting for resp viral panel (Flu was negative) Droplet precautions Bipap, change to prn and qhs.  Sauk o2 Will add back prednisone as her wheezing has worsened / increased  CARDIOVASCULAR A: H/o HTN P:   H/o home BB in setting of sepsis for now Restart home lasix 4/4  RENAL A: CKD, unclear baseline.   P:   Monitor UOP and Cr  GASTROINTESTINAL A: Diarrhea, c diff negative P:   Imodium ordered Advance diet to heart healthy  HEMATOLOGIC A: Pancytopenia, chronic H/o lymphoma P:   Monitor CBC Transfuse for Hg<7  INFECTIOUS A: Sepsis 2/2 HCAP vs viral infection P:   Abx and cx as above  ENDOCRINE A: Hypothyroidism P:   Cont home synthroid  NEUROLOGIC A: AMS c/w delirium ??Dementia:  Son endorses recent memory issues P:   Minimize sedating meds Hold benzos in setting of delirium.  Hold home lyrica Cont duloxetine and wellbutrin OOB as tolerated Lights on during the day   Social / Family:  Son Marcello Moores) update via phone.  His number is [phone] Intubation OK short term, no CPR    Baltazar Apo, MD, PhD 03/13/2015, 12:05 PM [facility] [phone] or if no answer [phone]

## 2015-03-13 NOTE — Progress Notes (Signed)
[facility] ADULT ICU REPLACEMENT PROTOCOL FOR AM LAB REPLACEMENT ONLY  The patient does not apply for the [facility] Adult ICU Electrolyte Replacment Protocol based on the criteria listed below:   1. Is GFR >/= 40 ml/min? Yes.    Patient's GFR today is 54 2. Is urine output >/= 0.5 ml/kg/hr for the last 6 hours? No. Patient's UOP is 0 ml/kg/hr 3. Is BUN < 60 mg/dL? Yes.    Patient's BUN today is 22 4. Abnormal electrolyte(s): K+3.0 5. Ordered repletion with: NA 6. If a panic level lab has been reported, has the CCM MD in charge been notified? Yes.  .   Physician:  Hulan Amato [facility] 03/13/2015 5:46 AM   Baltazar Apo, MD, PhD 03/15/2015, 10:04 AM [facility] [phone] or if no answer [phone]

## 2015-03-13 NOTE — Progress Notes (Signed)
Clinical Social Work Department BRIEF PSYCHOSOCIAL ASSESSMENT 03/13/2015  Patient:  Vicki Bauer, Vicki Bauer     Account Number:  [id number]     Admit date:  04/06/2015  Clinical Social Worker:  Maryln Manuel  Date/Time:  03/13/2015 02:10 PM  Referred by:  Physician  Date Referred:  03/13/2015 Referred for  SNF Placement   Other Referral:   Interview type:  Patient Other interview type:    PSYCHOSOCIAL DATA Living Status:  FACILITY Admitted from facility:  [place] Level of care:  [place] Primary support name:  Marcello Moores [place]/son/[phone] Primary support relationship to patient:  CHILD, ADULT Degree of support available:   adequate    CURRENT CONCERNS Current Concerns  Post-Acute Placement   Other Concerns:    SOCIAL WORK ASSESSMENT / PLAN CSW received referral that pt admitted from [facility].    CSW met with pt at bedside. CSW introduced self and explained role. Pt confirmed that she is a resident at [facility]. Pt stated that she had to come to hospital secondary to pneumonia. CSW discussed with pt that once she becomes more stable then PT will evaluate pt and inquired with pt about returning to [facility]. Pt states that she wants to see how she progresses as she lived with her son prior to [facility], but states that if she is weak then she may return to SNF for continued rehab. CSW expressed understanding and provided support.    CSW completed FL2 and sent updated clinicals to [employer].    CSW to continue to follow pt progress and assist with pt discharge planning needs as pt progresses.   Assessment/plan status:  Psychosocial Support/Ongoing Assessment of Needs Other assessment/ plan:   discharge planning   Information/referral to community resources:   Referral back to [employer].    PATIENT'S/FAMILY'S RESPONSE TO PLAN OF CARE: Pt alert and oriented x 3 during CSW visit. Per RN, pt  has had periods of confusion. Pt pleasant and actively involved in conversation and able to discuss that she came to hospital from [facility] and able to identify why she was in the hospital. CSW to continue to follow and involve pt son in disposition conversations as pt progresses.   Alison Murray, MSW, [place] [facility] [phone]

## 2015-03-14 ENCOUNTER — Inpatient Hospital Stay (HOSPITAL_COMMUNITY): Payer: MEDICARE

## 2015-03-14 DIAGNOSIS — N390 Urinary tract infection, site not specified: Secondary | ICD-10-CM

## 2015-03-14 DIAGNOSIS — J9600 Acute respiratory failure, unspecified whether with hypoxia or hypercapnia: Secondary | ICD-10-CM | POA: Diagnosis present

## 2015-03-14 DIAGNOSIS — J9601 Acute respiratory failure with hypoxia: Secondary | ICD-10-CM

## 2015-03-14 LAB — CBC
HEMATOCRIT: 31.6 % — AB (ref 36.0–46.0)
Hemoglobin: 9.5 g/dL — ABNORMAL LOW (ref 12.0–15.0)
MCH: 26.2 pg (ref 26.0–34.0)
MCHC: 30.1 g/dL (ref 30.0–36.0)
MCV: 87.3 fL (ref 78.0–100.0)
PLATELETS: 88 10*3/uL — AB (ref 150–400)
RBC: 3.62 MIL/uL — AB (ref 3.87–5.11)
RDW: 16.8 % — ABNORMAL HIGH (ref 11.5–15.5)
WBC: 5.5 10*3/uL (ref 4.0–10.5)

## 2015-03-14 LAB — BASIC METABOLIC PANEL
ANION GAP: 9 (ref 5–15)
BUN: 18 mg/dL (ref 6–23)
CHLORIDE: 105 mmol/L (ref 96–112)
CO2: 29 mmol/L (ref 19–32)
Calcium: 7.8 mg/dL — ABNORMAL LOW (ref 8.4–10.5)
Creatinine, Ser: 0.85 mg/dL (ref 0.50–1.10)
GFR calc Af Amer: 71 mL/min — ABNORMAL LOW (ref >90)
GFR calc non Af Amer: 61 mL/min — ABNORMAL LOW (ref >90)
GLUCOSE: 99 mg/dL (ref 70–99)
Potassium: 3.1 mmol/L — ABNORMAL LOW (ref 3.5–5.1)
SODIUM: 143 mmol/L (ref 135–145)

## 2015-03-14 LAB — PHOSPHORUS: Phosphorus: 1.9 mg/dL — ABNORMAL LOW (ref 2.3–4.6)

## 2015-03-14 LAB — RESPIRATORY VIRUS PANEL
Adenovirus: NEGATIVE
INFLUENZA B 1: NEGATIVE
Influenza A: NEGATIVE
METAPNEUMOVIRUS: POSITIVE — AB
PARAINFLUENZA 1 A: NEGATIVE
PARAINFLUENZA 2 A: NEGATIVE
Parainfluenza 3: NEGATIVE
Respiratory Syncytial Virus A: NEGATIVE
Respiratory Syncytial Virus B: NEGATIVE
Rhinovirus: NEGATIVE

## 2015-03-14 LAB — EXPECTORATED SPUTUM ASSESSMENT W REFEX TO RESP CULTURE: SPECIAL REQUESTS: NORMAL

## 2015-03-14 LAB — MAGNESIUM: Magnesium: 1.7 mg/dL (ref 1.5–2.5)

## 2015-03-14 LAB — EXPECTORATED SPUTUM ASSESSMENT W GRAM STAIN, RFLX TO RESP C

## 2015-03-14 MED ORDER — POTASSIUM CHLORIDE 20 MEQ/15ML (10%) PO SOLN: 40.0000 meq | Freq: Three times a day (TID) | ORAL | Status: DC

## 2015-03-14 MED ORDER — DEXTROMETHORPHAN POLISTIREX 30 MG/5ML PO LQCR
30.0000 mg | Freq: Two times a day (BID) | ORAL | Status: DC
  Administered 2015-03-14 – 2015-03-15 (×3): 30 mg via ORAL
  Filled 2015-03-14 (×9): qty 5

## 2015-03-14 MED ORDER — POTASSIUM CHLORIDE 20 MEQ/15ML (10%) PO SOLN
40.0000 meq | Freq: Two times a day (BID) | ORAL | Status: AC
  Administered 2015-03-14: 40 meq
  Filled 2015-03-14 (×3): qty 30

## 2015-03-14 MED ORDER — FUROSEMIDE 10 MG/ML IJ SOLN
40.0000 mg | Freq: Two times a day (BID) | INTRAMUSCULAR | Status: DC
  Administered 2015-03-14 – 2015-03-15 (×3): 40 mg via INTRAVENOUS
  Filled 2015-03-14 (×3): qty 4

## 2015-03-14 MED ORDER — POTASSIUM PHOSPHATES 15 MMOLE/5ML IV SOLN
30.0000 mmol | Freq: Once | INTRAVENOUS | Status: AC
  Administered 2015-03-14: 30 mmol via INTRAVENOUS
  Filled 2015-03-14: qty 10

## 2015-03-14 MED ORDER — LORAZEPAM 2 MG/ML IJ SOLN
0.5000 mg | Freq: Once | INTRAMUSCULAR | Status: AC
  Administered 2015-03-14: 0.5 mg via INTRAVENOUS
  Filled 2015-03-14: qty 1

## 2015-03-14 MED ORDER — FUROSEMIDE 40 MG PO TABS
40.0000 mg | ORAL_TABLET | Freq: Every day | ORAL | Status: DC
  Administered 2015-03-15: 40 mg via ORAL
  Filled 2015-03-14: qty 1

## 2015-03-14 NOTE — Progress Notes (Signed)
PULMONARY / CRITICAL CARE MEDICINE HISTORY AND PHYSICAL EXAMINATION   Name: Vicki Bauer MRN: [id number] DOB: [birth year]    ADMISSION DATE:  03/24/2015  PRIMARY SERVICE: PCCM  CHIEF COMPLAINT:  SOB, hypoxia  BRIEF PATIENT DESCRIPTION: 81 y/o F, ALF resident, presents with worsening fever and SOB, found to be hypoxic and with infiltrates on CXR c/w PNA vs possible flu.    SIGNIFICANT EVENTS / STUDIES:  4/02  Presented to ED, CXR with multifocal patchy infiltrates, worse in LUL 4/05  Wore bipap overnight, wheezing + cough  LINES / TUBES: Foley 4/1 >>   CULTURES: BCx2 x 2 4/1 >>  UC 4/1 >> E-Coli >> sens rocephin C-Diff 4/3 >> neg    ANTIBIOTICS: Vanc 4/1 >> Cefepime 4/1 >>  SUBJECTIVE:  Patient reports increase in cough and wheezing.  Up eating breakfast   VITAL SIGNS: Temp:  [97.4 F (36.3 C)-98.8 F (37.1 C)] 98.1 F (36.7 C) (04/05 0751) Pulse Rate:  [76-87] 80 (04/05 0445) Resp:  [21-35] 23 (04/05 0445) BP: (110-184)/(58-129) 112/64 mmHg (04/05 0422) SpO2:  [92 %-100 %] 96 % (04/05 0751) FiO2 (%):  [40 %] 40 % (04/05 0422) Weight:  [164 lb 3.9 oz (74.5 kg)] 164 lb 3.9 oz (74.5 kg) (04/05 0500)   HEMODYNAMICS:     VENTILATOR SETTINGS: Vent Mode:  [-]  FiO2 (%):  [40 %] 40 %   INTAKE / OUTPUT: Intake/Output      04/04 0701 - 04/05 0700 04/05 0701 - 04/06 0700   P.O. 120    I.V. (mL/kg)     IV Piggyback 300    Total Intake(mL/kg) 420 (5.6)    Urine (mL/kg/hr)     Stool     Total Output       Net +420          Urine Occurrence 13 x    Stool Occurrence 7 x      PHYSICAL EXAMINATION: General:  Comfortable, awake Neuro: Awake and alert, oriented, following commands, non-focal HEENT:  PERR Cardiovascular: RRR, no murmurs Lungs:  B insp crackles, Coarse B exp wheezes Abdomen:  +BS Musculoskeletal:  No edema Skin:  Warm, patchy erythematous rash on BLE  LABS:  CBC  Recent Labs Lab 03/28/2015 2011 03/11/15 1215 03/14/15 0350  WBC 3.0*  2.7* 5.5  HGB 8.9* 8.6* 9.5*  HCT 30.0* 28.1* 31.6*  PLT 99* 104* 88*   Coag's No results for input(s): APTT, INR in the last 168 hours.   BMET  Recent Labs Lab 03/11/15 1215 03/13/15 0411 03/14/15 0350  NA 146* 145 143  K 3.5 3.0* 3.1*  CL 108 106 105  CO2 28 30 29   BUN 25* 22 18  CREATININE 1.09 0.94 0.85  GLUCOSE 116* 89 99   Electrolytes  Recent Labs Lab 03/11/15 1215 03/13/15 0411 03/14/15 0350  CALCIUM 8.0* 7.9* 7.8*  MG  --   --  1.7  PHOS  --   --  1.9*   Sepsis Markers  Recent Labs Lab 04/01/2015 2029  LATICACIDVEN 1.46   ABG  Recent Labs Lab 03/15/2015 2140  PHART 7.347*  PCO2ART 47.2*  PO2ART 66.3*   Liver Enzymes  Recent Labs Lab 04/05/2015 2011  AST 59*  ALT 21  ALKPHOS 83  BILITOT 0.6  ALBUMIN 3.1*   Cardiac Enzymes No results for input(s): TROPONINI, PROBNP in the last 168 hours.   Glucose No results for input(s): GLUCAP in the last 168 hours.  Imaging Dg Chest [facility] [address]  03/14/2015   CLINICAL DATA:  Hypoxia.  EXAM: PORTABLE CHEST - 1 VIEW  COMPARISON:  04/06/2015.  FINDINGS: Mediastinum stable. Stable cardiomegaly. Diffuse severe bilateral pulmonary infiltrates have increased. No interim change. No pleural effusion or pneumothorax.  IMPRESSION: 1. Worsening diffuse severe bilateral pulmonary infiltrates. Bilateral pneumonia could present in this fashion.  2.  Heart size stable.   Electronically Signed   By: Marcello Moores  Register   On: 03/14/2015 07:14    ASSESSMENT / PLAN:  Active Problems:   HCAP (healthcare-associated pneumonia)   PULMONARY A: Hypoxemic Respiratory Failure - in setting of bilateral infiltrates, edema,  HCAP vs viral PNA P:   Vanc/ cefepime for HCAP Waiting for resp viral panel (Flu was negative) Droplet precautions Bipap QHS & PRN sleep   Wean O2 for sats > 90% Prednisone 40 mg QD Delsym for cough  CARDIOVASCULAR A: H/o HTN P:   Continue bisoprolol 5mg  QD Initiated home lasix 4/4, 40 mg QD.  Change  to IV BID x2 doses 4/5.  Then resume home dose 4/6 (depending on improvement and CXR)  RENAL A: CKD, unclear baseline.   P:   Monitor UOP and Cr  GASTROINTESTINAL A: Diarrhea, c diff negative P:   Imodium ordered Advance diet to heart healthy  HEMATOLOGIC A: Pancytopenia, chronic H/o lymphoma P:   Monitor CBC Transfuse for Hg<7  INFECTIOUS A: Sepsis 2/2 HCAP vs viral infection E-Coli UTI - sens rocephin P:   Abx and cx as above  ENDOCRINE A: Hypothyroidism P:   Cont home synthroid  NEUROLOGIC A: AMS c/w delirium ??Dementia:  Son endorses recent memory issues P:   Minimize sedating meds Hold benzos in setting of delirium.  Hold home lyrica Cont duloxetine and wellbutrin OOB as tolerated Lights on during the day   GLOBAL:  Patient updated at bedside.  No family available today.  Marcello Moores (son), number is [phone].  Intubation OK short term, no CPR    Noe Gens, NP-C [facility] Pgr: [phone] or [phone] 03/14/2015, 8:55 AM  Attending Note:  I have examined patient, reviewed labs, studies and notes. I have discussed the case with B Ollis, and I agree with the data and plans as amended above.  Baltazar Apo, MD, PhD 03/14/2015, 11:08 AM [facility] [phone] or if no answer [phone]

## 2015-03-14 NOTE — Progress Notes (Signed)
eLink Physician-Brief Progress Note Patient Name: MONACA WADAS DOB: 03/23/1928 MRN: [id number]   Date of Service  03/14/2015  HPI/Events of Note  Hypokalemia and hypophosphatemia  eICU Interventions  Potassium and phos replaced     Intervention Category Intermediate Interventions: Electrolyte abnormality - evaluation and management  DETERDING,ELIZABETH 03/14/2015, 5:12 AM

## 2015-03-15 ENCOUNTER — Inpatient Hospital Stay (HOSPITAL_COMMUNITY): Payer: MEDICARE

## 2015-03-15 LAB — BASIC METABOLIC PANEL
Anion gap: 12 (ref 5–15)
BUN: 18 mg/dL (ref 6–23)
CALCIUM: 8 mg/dL — AB (ref 8.4–10.5)
CO2: 28 mmol/L (ref 19–32)
CREATININE: 0.84 mg/dL (ref 0.50–1.10)
Chloride: 101 mmol/L (ref 96–112)
GFR calc Af Amer: 72 mL/min — ABNORMAL LOW (ref >90)
GFR, EST NON AFRICAN AMERICAN: 62 mL/min — AB (ref >90)
GLUCOSE: 108 mg/dL — AB (ref 70–99)
Potassium: 4.2 mmol/L (ref 3.5–5.1)
Sodium: 141 mmol/L (ref 135–145)

## 2015-03-15 LAB — CBC
HCT: 30.7 % — ABNORMAL LOW (ref 36.0–46.0)
HEMOGLOBIN: 9.1 g/dL — AB (ref 12.0–15.0)
MCH: 25.4 pg — AB (ref 26.0–34.0)
MCHC: 29.6 g/dL — ABNORMAL LOW (ref 30.0–36.0)
MCV: 85.8 fL (ref 78.0–100.0)
PLATELETS: 114 10*3/uL — AB (ref 150–400)
RBC: 3.58 MIL/uL — ABNORMAL LOW (ref 3.87–5.11)
RDW: 16.5 % — ABNORMAL HIGH (ref 11.5–15.5)
WBC: 5.9 10*3/uL (ref 4.0–10.5)

## 2015-03-15 MED ORDER — PREDNISONE 20 MG PO TABS
20.0000 mg | ORAL_TABLET | Freq: Every day | ORAL | Status: DC
  Administered 2015-03-16: 20 mg via ORAL
  Filled 2015-03-15: qty 1

## 2015-03-15 MED ORDER — FUROSEMIDE 10 MG/ML IJ SOLN
60.0000 mg | Freq: Two times a day (BID) | INTRAMUSCULAR | Status: AC
  Administered 2015-03-15 – 2015-03-16 (×4): 60 mg via INTRAVENOUS
  Filled 2015-03-15 (×4): qty 6

## 2015-03-15 NOTE — Progress Notes (Signed)
PULMONARY / CRITICAL CARE MEDICINE HISTORY AND PHYSICAL EXAMINATION   Name: Vicki Bauer MRN: [id number] DOB: Jan 05, 1928    ADMISSION DATE:  04/04/2015  PRIMARY SERVICE: PCCM  CHIEF COMPLAINT:  SOB, hypoxia  BRIEF PATIENT DESCRIPTION: 81 y/o F, ALF resident, presents with worsening fever and SOB, found to be hypoxic and with infiltrates on CXR c/w PNA vs possible flu.    SIGNIFICANT EVENTS / STUDIES:  4/02  Presented to ED, CXR with multifocal patchy infiltrates, worse in LUL 4/05  Wore bipap overnight, wheezing + cough  LINES / TUBES: Foley 4/1 >>   CULTURES: BCx2 x 2 4/1 >>  UC 4/1 >> E-Coli >> sens rocephin C-Diff 4/3 >> neg  RVP 4/2 >> metapneumovirus   ANTIBIOTICS: Vanc 4/1 >> 4/6 Cefepime 4/1 >>  SUBJECTIVE:   Uncomfortable this am, c/o HA Increased O2 needs overnight Was placed in mitts because pulling off 0.50 mask  VITAL SIGNS: Temp:  [97 F (36.1 C)-98.4 F (36.9 C)] 97.4 F (36.3 C) (04/06 0800) Pulse Rate:  [80-92] 92 (04/06 0839) Resp:  [19-26] 25 (04/06 0839) BP: (160-183)/(60-98) 174/77 mmHg (04/06 0839) SpO2:  [89 %-100 %] 96 % (04/06 0839) FiO2 (%):  [50 %] 50 % (04/06 0754) Weight:  [73.7 kg (162 lb 7.7 oz)] 73.7 kg (162 lb 7.7 oz) (04/06 0500)   HEMODYNAMICS:     VENTILATOR SETTINGS: Vent Mode:  [-]  FiO2 (%):  [50 %] 50 %   INTAKE / OUTPUT: Intake/Output      04/05 0701 - 04/06 0700 04/06 0701 - 04/07 0700   P.O. 120 120   IV Piggyback 300    Total Intake(mL/kg) 420 (5.7) 120 (1.6)   Urine (mL/kg/hr) 375 (0.2) 300 (1.3)   Total Output 375 300   Net +45 -180        Urine Occurrence 3 x 1 x     PHYSICAL EXAMINATION: General:  Comfortable, awake Neuro: Awake and alert, oriented, following commands, non-focal HEENT:  PERR Cardiovascular: RRR, no murmurs Lungs:  B insp crackles, Coarse B exp wheezes Abdomen:  +BS Musculoskeletal:  No edema Skin:  Warm, patchy erythematous rash on BLE  LABS:  CBC  Recent Labs Lab  03/11/15 1215 03/14/15 0350 03/15/15 0345  WBC 2.7* 5.5 5.9  HGB 8.6* 9.5* 9.1*  HCT 28.1* 31.6* 30.7*  PLT 104* 88* 114*   Coag's No results for input(s): APTT, INR in the last 168 hours.   BMET  Recent Labs Lab 03/13/15 0411 03/14/15 0350 03/15/15 0345  NA 145 143 141  K 3.0* 3.1* 4.2  CL 106 105 101  CO2 30 29 28   BUN 22 18 18   CREATININE 0.94 0.85 0.84  GLUCOSE 89 99 108*   Electrolytes  Recent Labs Lab 03/13/15 0411 03/14/15 0350 03/15/15 0345  CALCIUM 7.9* 7.8* 8.0*  MG  --  1.7  --   PHOS  --  1.9*  --    Sepsis Markers  Recent Labs Lab 04/06/2015 2029  LATICACIDVEN 1.46   ABG  Recent Labs Lab 04/05/2015 2140  PHART 7.347*  PCO2ART 47.2*  PO2ART 66.3*   Liver Enzymes  Recent Labs Lab 04/01/2015 2011  AST 59*  ALT 21  ALKPHOS 83  BILITOT 0.6  ALBUMIN 3.1*   Cardiac Enzymes No results for input(s): TROPONINI, PROBNP in the last 168 hours.   Glucose No results for input(s): GLUCAP in the last 168 hours.  Imaging Dg Chest Port 1 View  03/15/2015   CLINICAL  DATA:  Respiratory failure.  EXAM: PORTABLE CHEST - 1 VIEW  COMPARISON:  03/14/2015.  FINDINGS: Mediastinum and hilar structures are stable. Stable cardiomegaly. Diffuse bilateral airspace disease again noted. No significant interim change. Tiny bilateral pleural effusions cannot be excluded. No pneumothorax. No acute osseous abnormality.  IMPRESSION: 1. Persistent prominent bilateral airspace disease. No significant interim change. Associated small pleural effusions cannot be excluded.  2. Stable cardiomegaly.   Electronically Signed   By: Marcello Moores  Register   On: 03/15/2015 07:22   Dg Chest Port 1 View  03/14/2015   CLINICAL DATA:  Hypoxia.  EXAM: PORTABLE CHEST - 1 VIEW  COMPARISON:  03/16/2015.  FINDINGS: Mediastinum stable. Stable cardiomegaly. Diffuse severe bilateral pulmonary infiltrates have increased. No interim change. No pleural effusion or pneumothorax.  IMPRESSION: 1. Worsening  diffuse severe bilateral pulmonary infiltrates. Bilateral pneumonia could present in this fashion.  2.  Heart size stable.   Electronically Signed   By: Marcello Moores  Register   On: 03/14/2015 07:14    ASSESSMENT / PLAN:  Active Problems:   HCAP (healthcare-associated pneumonia)   Acute respiratory failure   Acute UTI (urinary tract infection)   PULMONARY A: Hypoxemic Respiratory Failure - in setting of bilateral infiltrates, edema,  HCAP vs viral PNA (suspect the latter) P:   Complete cefepime x 7 days, plan d/c vanco 4/6 Droplet precautions for metapneumovirus Bipap QHS & PRN sleep   Wean O2 for sats > 90% Prednisone > will attempt to wean quickly, ? Contributing to delirium Delsym for cough  CARDIOVASCULAR A: H/o HTN P:   Continue bisoprolol 5mg  QD Will continue to push diuresis, follow I/O and CXR  RENAL A: CKD, unclear baseline.   P:   Monitor UOP and Cr  GASTROINTESTINAL A: Diarrhea, c diff negative P:   Imodium ordered Advance diet to heart healthy  HEMATOLOGIC A: Pancytopenia, chronic H/o lymphoma P:   Monitor CBC Transfuse for Hg<7  INFECTIOUS A: Sepsis 2/2 HCAP vs viral infection E-Coli UTI - sens rocephin P:   D/c vanco 4/6 Continue cefepime, plan 7 days total  ENDOCRINE A: Hypothyroidism P:   Cont home synthroid  NEUROLOGIC A: AMS c/w delirium ??Dementia:  Son endorses recent memory issues P:   Will wean prednisone quickly, ? Whether this is contributing to her delirium Minimize sedating meds Home benzo's are ordered, may need to adjust  Hold home lyrica Cont duloxetine and wellbutrin OOB as tolerated Lights on during the day   GLOBAL:  Patient updated at bedside.   Marcello Moores (son), number is [phone].  Intubation OK short term, no CPR  35 minutes CC time   Baltazar Apo, MD, PhD 03/15/2015, 10:04 AM  Pulmonary and Critical Care [phone] or if no answer [phone]

## 2015-03-15 NOTE — Progress Notes (Signed)
CSW continuing to follow.   Pt admitted from [facility].   Per RN, pt experiencing some delirium and continues to require high oxygen needs.   CSW sent updated clinicals to [employer].  CSW to continue to follow to provide support and assist with disposition needs as appropriate.  Alison Murray, MSW, [place] [facility] [phone]

## 2015-03-15 NOTE — Progress Notes (Signed)
Pt is still coughing after eating. Delayed response of 5 minutes and also desat's to 80% when chewing.

## 2015-03-15 NOTE — Progress Notes (Signed)
Contacted E-link concerning pt's cough. Waiting on response. Nurse states that MD is behind and may not get to pt before end of shift. Will continue to monitor.

## 2015-03-16 ENCOUNTER — Inpatient Hospital Stay (HOSPITAL_COMMUNITY): Payer: MEDICARE

## 2015-03-16 DIAGNOSIS — J123 Human metapneumovirus pneumonia: Secondary | ICD-10-CM | POA: Diagnosis present

## 2015-03-16 LAB — CULTURE, RESPIRATORY W GRAM STAIN

## 2015-03-16 LAB — GLUCOSE, CAPILLARY: GLUCOSE-CAPILLARY: 99 mg/dL (ref 70–99)

## 2015-03-16 LAB — BASIC METABOLIC PANEL
Anion gap: 15 (ref 5–15)
BUN: 23 mg/dL (ref 6–23)
CO2: 31 mmol/L (ref 19–32)
CREATININE: 1.09 mg/dL (ref 0.50–1.10)
Calcium: 8.6 mg/dL (ref 8.4–10.5)
Chloride: 98 mmol/L (ref 96–112)
GFR, EST AFRICAN AMERICAN: 52 mL/min — AB (ref >90)
GFR, EST NON AFRICAN AMERICAN: 45 mL/min — AB (ref >90)
GLUCOSE: 94 mg/dL (ref 70–99)
Potassium: 3.9 mmol/L (ref 3.5–5.1)
Sodium: 144 mmol/L (ref 135–145)

## 2015-03-16 MED ORDER — HALOPERIDOL LACTATE 5 MG/ML IJ SOLN
5.0000 mg | Freq: Four times a day (QID) | INTRAMUSCULAR | Status: DC | PRN
  Administered 2015-03-17 (×2): 5 mg via INTRAVENOUS
  Filled 2015-03-16 (×3): qty 1

## 2015-03-16 MED ORDER — HALOPERIDOL LACTATE 5 MG/ML IJ SOLN
5.0000 mg | INTRAMUSCULAR | Status: AC
  Administered 2015-03-16: 5 mg via INTRAVENOUS

## 2015-03-16 MED ORDER — LIP MEDEX EX OINT: TOPICAL_OINTMENT | CUTANEOUS | Status: DC | PRN

## 2015-03-16 MED ORDER — LORAZEPAM 2 MG/ML IJ SOLN
INTRAMUSCULAR | Status: AC
  Administered 2015-03-16: 1 mg via INTRAVENOUS
  Filled 2015-03-16: qty 1

## 2015-03-16 MED ORDER — VITAMINS A & D EX OINT
TOPICAL_OINTMENT | CUTANEOUS | Status: AC
  Filled 2015-03-16: qty 5

## 2015-03-16 MED ORDER — LIP MEDEX EX OINT
TOPICAL_OINTMENT | CUTANEOUS | Status: AC
  Filled 2015-03-16: qty 7

## 2015-03-16 MED ORDER — LORAZEPAM 2 MG/ML IJ SOLN
0.5000 mg | INTRAMUSCULAR | Status: AC | PRN
  Administered 2015-03-16: 0.5 mg via INTRAVENOUS
  Administered 2015-03-16: 1 mg via INTRAVENOUS
  Filled 2015-03-16: qty 1

## 2015-03-16 MED ORDER — FENTANYL CITRATE 0.05 MG/ML IJ SOLN
25.0000 ug | Freq: Once | INTRAMUSCULAR | Status: AC
  Administered 2015-03-16: 25 ug via INTRAVENOUS
  Filled 2015-03-16: qty 2

## 2015-03-16 MED ORDER — DEXMEDETOMIDINE HCL IN NACL 200 MCG/50ML IV SOLN
0.4000 ug/kg/h | INTRAVENOUS | Status: DC
  Administered 2015-03-16: 0.4 ug/kg/h via INTRAVENOUS
  Administered 2015-03-16 – 2015-03-17 (×5): 1.2 ug/kg/h via INTRAVENOUS
  Filled 2015-03-16 (×7): qty 50

## 2015-03-16 MED ORDER — PREDNISONE 10 MG PO TABS: 10.0000 mg | ORAL_TABLET | Freq: Every day | ORAL | Status: DC

## 2015-03-16 MED ORDER — HALOPERIDOL LACTATE 5 MG/ML IJ SOLN
INTRAMUSCULAR | Status: AC
  Administered 2015-03-16: 5 mg
  Filled 2015-03-16: qty 1

## 2015-03-16 NOTE — Progress Notes (Signed)
ANTIBIOTIC CONSULT NOTE - FOLLOW UP  Pharmacy Consult for Cefepime Indication: HCAP and E. Coli UTI  Allergies  Allergen Reactions  . Demerol [Meperidine] Other (See Comments)    Turned patient blue and couldn't breathe  . Morphine And Related Other (See Comments)    Turned patient blue and couldn't breathe   . Macrolides And Ketolides Nausea Only    Patient Measurements: Height: 5\' 2"  (157.5 cm) Weight: 155 lb 3.3 oz (70.4 kg) IBW/kg (Calculated) : 50.1  Vital Signs: Temp: 97.8 F (36.6 C) (04/07 0850) Temp Source: Oral (04/07 0850) BP: 170/99 mmHg (04/07 0800) Pulse Rate: 94 (04/07 0946) Intake/Output from previous day: 04/06 0701 - 04/07 0700 In: 47 [P.O.:300; I.V.:70; IV Piggyback:50] Out: 500 [Urine:500] Intake/Output from this shift:    Labs:  Recent Labs  03/14/15 0350 03/15/15 0345 03/16/15 0336  WBC 5.5 5.9  --   HGB 9.5* 9.1*  --   PLT 88* 114*  --   CREATININE 0.85 0.84 1.09   Estimated Creatinine Clearance: 35.3 mL/min (by C-G formula based on Cr of 1.09).  Recent Labs  03/13/15 2055  Vance 15.6     Microbiology: Recent Results (from the past 720 hour(s))  Culture, blood (routine x 2)     Status: None (Preliminary result)   Collection Time: 03/20/2015  8:11 PM  Result Value Ref Range Status   Specimen Description BLOOD RIGHT HAND  Final   Special Requests BOTTLES DRAWN AEROBIC AND ANAEROBIC 5ML  Final   Culture   Final           BLOOD CULTURE RECEIVED NO GROWTH TO DATE CULTURE WILL BE HELD FOR 5 DAYS BEFORE ISSUING A FINAL NEGATIVE REPORT Performed at [facility]    Report Status PENDING  Incomplete  Culture, blood (routine x 2)     Status: None (Preliminary result)   Collection Time: 04/08/2015  8:11 PM  Result Value Ref Range Status   Specimen Description BLOOD RIGHT HAND  Final   Special Requests BOTTLES DRAWN AEROBIC AND ANAEROBIC 5ML  Final   Culture   Final           BLOOD CULTURE RECEIVED NO GROWTH TO DATE CULTURE  WILL BE HELD FOR 5 DAYS BEFORE ISSUING A FINAL NEGATIVE REPORT Performed at [facility]    Report Status PENDING  Incomplete  Urine culture     Status: None   Collection Time: 04/06/2015  8:45 PM  Result Value Ref Range Status   Specimen Description URINE, RANDOM  Final   Special Requests NONE  Final   Colony Count   Final    >=100,000 COLONIES/ML Performed at [facility]    Culture   Final    ESCHERICHIA COLI Performed at [facility]    Report Status 03/13/2015 FINAL  Final   Organism ID, Bacteria ESCHERICHIA COLI  Final      Susceptibility   Escherichia coli - MIC*    AMPICILLIN >=32 RESISTANT Resistant     CEFAZOLIN <=4 SENSITIVE Sensitive     CEFTRIAXONE <=1 SENSITIVE Sensitive     CIPROFLOXACIN >=4 RESISTANT Resistant     GENTAMICIN <=1 SENSITIVE Sensitive     LEVOFLOXACIN >=8 RESISTANT Resistant     NITROFURANTOIN <=16 SENSITIVE Sensitive     TOBRAMYCIN <=1 SENSITIVE Sensitive     TRIMETH/SULFA <=20 SENSITIVE Sensitive     PIP/TAZO 8 SENSITIVE Sensitive     * ESCHERICHIA COLI  MRSA PCR Screening  Status: None   Collection Time: 03/11/15 10:49 AM  Result Value Ref Range Status   MRSA by PCR NEGATIVE NEGATIVE Final    Comment:        The GeneXpert MRSA Assay (FDA approved for NASAL specimens only), is one component of a comprehensive MRSA colonization surveillance program. It is not intended to diagnose MRSA infection nor to guide or monitor treatment for MRSA infections.   Respiratory virus panel     Status: Abnormal   Collection Time: 03/11/15  1:47 PM  Result Value Ref Range Status   Source - RVPAN NOSE  Corrected   Respiratory Syncytial Virus A Negative Negative Final   Respiratory Syncytial Virus B Negative Negative Final   Influenza A Negative Negative Final   Influenza B Negative Negative Final   Parainfluenza 1 Negative Negative Final   Parainfluenza 2 Negative Negative Final   Parainfluenza 3 Negative Negative Final    Metapneumovirus Positive (A) Negative Final   Rhinovirus Negative Negative Final   Adenovirus Negative Negative Final    Comment: (NOTE) Performed At: [facility] [place], [place] [id number] Lindon Romp MD KG:[id number]   Clostridium Difficile by PCR     Status: None   Collection Time: 03/12/15  8:40 PM  Result Value Ref Range Status   C difficile by pcr NEGATIVE NEGATIVE Final  Culture, expectorated sputum-assessment     Status: None   Collection Time: 03/14/15  8:18 AM  Result Value Ref Range Status   Specimen Description SPUTUM  Final   Special Requests Normal  Final   Sputum evaluation   Final    THIS SPECIMEN IS ACCEPTABLE. RESPIRATORY CULTURE REPORT TO FOLLOW.   Report Status 03/14/2015 FINAL  Final  Culture, respiratory (NON-Expectorated)     Status: None (Preliminary result)   Collection Time: 03/14/15  8:18 AM  Result Value Ref Range Status   Specimen Description SPUTUM  Final   Special Requests NONE  Final   Gram Stain   Final    FEW WBC PRESENT, PREDOMINANTLY MONONUCLEAR RARE SQUAMOUS EPITHELIAL CELLS PRESENT RARE YEAST Performed at [facility]    Culture   Final    MODERATE CANDIDA ALBICANS Performed at [facility]    Report Status PENDING  Incomplete    Medical History: Past Medical History  Diagnosis Date  . Hypertension   . Hypothyroidism   . Macular degeneration   . Cancer     lymphoma  . Insomnia   . OAB (overactive bladder)   . CAD (coronary artery disease) 1990    s/p stent x2,  . Sleep apnea     Medications:  Scheduled:  . antiseptic oral rinse  7 mL Mouth Rinse q12n4p  . bisoprolol  5 mg Oral Daily  . buPROPion  200 mg Oral BID  . ceFEPime (MAXIPIME) IV  1 g Intravenous Q12H  . chlorhexidine  15 mL Mouth Rinse BID  . dextromethorphan  30 mg Oral BID  . DULoxetine  60 mg Oral BID  . furosemide  60 mg Intravenous Q12H  . heparin  5,000 Units Subcutaneous 3 times per day  .  ipratropium-albuterol  3 mL Nebulization QID  . levothyroxine  50 mcg Oral QAC breakfast  . LORazepam  0.5 mg Oral QHS  . predniSONE  20 mg Oral Q breakfast  . pregabalin  75 mg Oral Daily   Infusions:    PRN: sodium chloride, acetaminophen, bisacodyl, loperamide, LORazepam, LORazepam, ondansetron (ZOFRAN) IV  Assessment:  41 [place] resident brought to [place] 41/16 with SOB, fever, confusion and mild respiratory distress recently treated for pneumonia with avelox and steroids. 04/04/2015 CXR revealed multifocal pathy infiltrates. UTI confirmed via positive urine culture for e. Coli on 04/08/2015. Pharmacy has been consulted to dose cefepime for HCAP and UTI.   4/1 vancomycin>>4/5 4/1 cefepime>> 4/1 Tamiflu >>4/4  4/1 UCx>>E coli 4/1 RVP>>metapnumeovirus 4/2 MRSA PCR>>neg 4/3 Cdiff>>neg 4.5 Flu>>neg 4/5 resp Cx>>moderate CA  Today, 03/16/15 is day 7 of cefepime 1gm IV q12hr for HCAP and Ecoli UTI. Renal function today has declined from yesterday likely due to increasing doses of furosemide. Used CrCl 30-78mL/min, with maximum daily dose of 2g per day and chose to continue cefepime IV 1g q12h to take advantage of more frequent dosing for improved time-dependent killing.   Tmax: Afebrile WBC: WNL Renal: SCr 1.09, CrCl ~35 mL/min CG   Goal of Therapy:  Cefepime dosed based on patient weight and renal function Eradication of infection  Plan:  -Continue cefepime 1g IV q12h -Monitor renal function, cultures, and clinical progression/improvement  Gloriajean Dell, PharmD Candidate  Addendum: I agree with the above assessment and plan.  Per CCM notes, plan for 7 day total of Cefepime course.  Patient should receive last doses today.  Hershal Coria, PharmD, BCPS Pager: [phone] 03/16/2015  10:44 AM

## 2015-03-16 NOTE — Progress Notes (Signed)
Patient unable to wear BIPAP at this time. Patient is restless. Will reevaluate a little later.

## 2015-03-16 NOTE — Progress Notes (Signed)
Agitated again after being calm on haldol  Plan Cont precedex gtt Repeat haldol 5mg IV stat  Dr. Brand Males, M.D., [facility].C.P Pulmonary and Critical Care Medicine Staff Physician [place] [facility] Pager: [phone], If no answer or between  15:00h - 7:00h: call [phone]  03/16/2015 9:30 PM

## 2015-03-16 NOTE — Progress Notes (Signed)
Patient agitated RASS +3 Moans, groans BP sbp 170 HR 100s  A Agigated delirium  P Precedex gtt  Dr. Brand Males, M.D., [facility].C.P Pulmonary and Critical Care Medicine Staff Physician [place] [facility] Pager: [phone], If no answer or between 15:00h - 7:00h: call [phone]  03/16/2015 5:56 PM

## 2015-03-16 NOTE — Progress Notes (Signed)
eLink Physician-Brief Progress Note Patient Name: Vicki Bauer DOB: 1928-09-10 MRN: [id number]   Date of Service  03/16/2015  HPI/Events of Note  Restless, not keeping oxygen mask in place, anxious  eICU Interventions  Ativan IV, limited dosing     Intervention Category Intermediate Interventions: Respiratory distress - evaluation and management Minor Interventions: Agitation / anxiety - evaluation and management  [name] 03/16/2015, 2:06 AM

## 2015-03-16 NOTE — Progress Notes (Signed)
persistnt agitation despite precedex RASs +3  Plan Haldol 5mg  IV state cxr stat If worse, intubate  Dr. Brand Males, M.D., [facility].C.P Pulmonary and Critical Care Medicine Staff Physician [place] [facility] Pager: [phone], If no answer or between  15:00h - 7:00h: call [phone]  03/16/2015 6:55 PM

## 2015-03-16 NOTE — Progress Notes (Signed)
Pt is extremely agitated, moaning and groaning, constantly moving about in the bed. Pulls at 02 tubing At times has a blank stare, then will cont to moan and groan saying help me, help me. Had Fentanyl and Ativan earlier without  Relief of anxiety.  Pt refused to have feeding tube placed, thrashed head about, even with assistance of 3 staff members.  Pt has been restless, will not allow Bipap mask to be placed when 02 sats dropped.  Is more disoriented than earlier.

## 2015-03-16 NOTE — Evaluation (Addendum)
Clinical/Bedside Swallow Evaluation Patient Details  Name: Vicki Bauer MRN: [id number] Date of Birth: [birth year]  Today's Date: 03/16/2015 Time: SLP Start Time (ACUTE ONLY): 24 SLP Stop Time (ACUTE ONLY): 1054 SLP Time Calculation (min) (ACUTE ONLY): 13 min  Past Medical History:  Past Medical History  Diagnosis Date  . Hypertension   . Hypothyroidism   . Macular degeneration   . Cancer     lymphoma  . Insomnia   . OAB (overactive bladder)   . CAD (coronary artery disease) 1990    s/p stent x2,  . Sleep apnea    Past Surgical History:  Past Surgical History  Procedure Laterality Date  . Tonsillectomy and adenoidectomy    . Hysterotomy    . Replacement total knee bilateral    . Lymph gland excision Bilateral   . Cholecystectomy    . Cataract extraction Bilateral   . Appendectomy    . Ptca    . Back surgery     HPI:  81 yo female adm to [facility] with respiratory failure - ? worsening bilateral pna vs flu.  PMH + for falls, symptom of CVA in December 2015 with resultant aphasia - MRI negative- pt is a resident of SNF.  She has been admitted x3 in the last 6 months.  CCS note ?s dementia -  delirium.  Swallow evaluation ordered as concern present for aspiration per RN.    Assessment / Plan / Recommendation Clinical Impression  Pt currently presents with severe aspiration risk secondary to dysphagia, significant dyspnea, gross weakness and cognitive deficit. Respiratory rate became increased and shallow raising concern for aspiration.  Nonproductive cough noted even without intake.  Pt also noted to demonstrate pursed lip breathing with wide-eye position after which she would report difficulty breathing.  Swallow is delayed with minimal amount SlP offered  - oral moisture via toothette and applesauce.  Pt complained of residuals in throat after swallow of applesauce - ? if accurate.  "Dry swallow" from oral moisture provided by SLP aided sensation of clearance per pt.    Due to  high aspiration risk, recommend NPO with oral care/moisture via toothette and SLP reeval next date with improved medical status.  Note, pt provided incorrect birth date today - ? orientation inconsistencies.  Given pt h/o dysphagia per SNF referral form, concern for swallow prognosis.      Aspiration Risk  Severe    Diet Recommendation NPO (oral moisture)   Medication Administration: Via alternative means    Other  Recommendations Oral Care Recommendations: Oral care Q4 per protocol   Follow Up Recommendations       Frequency and Duration min 2x/week  1 week   Pertinent Vitals/Pain Afebrile, congested weak cough, pt moans frequently      Swallow Study Prior Functional Status   pt resides at SNF, on low sodium diet ? Per referral ?    General Date of Onset: 03/16/15 HPI: 81 yo female adm to [facility] with respiratory failure - ? worsening bilateral pna vs flu.  PMH + for falls, CVA in December 2015 with resultant aphasia - pt is a resident of SNF.  She has been admitted x3 in the last 6 months.  CCS note ?s dementia -  delirium.  Swallow evaluation ordered as concern present for aspiration per RN.  Type of Study: Bedside swallow evaluation Diet Prior to this Study: Regular;Thin liquids Temperature Spikes Noted: No Respiratory Status: Nasal cannula (six liters) History of Recent Intubation: No Behavior/Cognition: Alert;Cooperative Oral  Cavity - Dentition: Edentulous (no dentures in room) Self-Feeding Abilities: Total assist Patient Positioning: Upright in bed Baseline Vocal Quality: Low vocal intensity;Breathy (WEAK) Volitional Cough: Weak Volitional Swallow: Unable to elicit    Oral/Motor/Sensory Function Overall Oral Motor/Sensory Function: Appears within functional limits for tasks assessed (generalized weakness, no focal CN deficits from limited evaluation completed *that pt would participate in*)   Ice Chips Ice chips: Not tested   Thin Liquid Thin Liquid: Impaired Pharyngeal   Phase Impairments: Suspected delayed Swallow Other Comments: moisture via toothette offered only    Nectar Thick Nectar Thick Liquid: Not tested   Honey Thick Honey Thick Liquid: Not tested   Puree Puree: Impaired Presentation: Spoon Oral Phase Impairments: Reduced lingual movement/coordination;Reduced labial seal Oral Phase Functional Implications: Prolonged oral transit Pharyngeal Phase Impairments: Suspected delayed Swallow Other Comments: pt report of residuals in pharynx-use of moisture via toothette aided clearance   Solid   GO    Solid: Not tested Other Comments: due to aspiration risk       Vicki Bauer, [facility] SLP [phone]  SLP spoke to MD re: findings re: swallow evaluation and recommendations.

## 2015-03-16 NOTE — Progress Notes (Signed)
PULMONARY / CRITICAL CARE MEDICINE HISTORY AND PHYSICAL EXAMINATION   Name: Vicki Bauer MRN: [id number] DOB: [birth year]    ADMISSION DATE:  03/30/2015  PRIMARY SERVICE: PCCM  CHIEF COMPLAINT:  SOB, hypoxia  BRIEF PATIENT DESCRIPTION: 81 y/o F, ALF resident, presents with worsening fever and SOB, found to be hypoxic and with infiltrates on CXR c/w PNA vs possible flu.    SIGNIFICANT EVENTS / STUDIES:  4/02  Presented to ED, CXR with multifocal patchy infiltrates, worse in LUL 4/05  Wore bipap overnight, wheezing + cough  LINES / TUBES: Foley 4/1 >>   CULTURES: BCx2 x 2 4/1 >>  UC 4/1 >> E-Coli >> sens rocephin C-Diff 4/3 >> neg  RVP 4/2 >> metapneumovirus   ANTIBIOTICS: Vanc 4/1 >> 4/6 Cefepime 4/1 >> 4/7  SUBJECTIVE:   Frequent cough, some concern for aspiration sx when taking liquids on 4/6 Agitated o/n, only intermittently wore CPAP Uncomfortable this am, "hurts all over"  VITAL SIGNS: Temp:  [97.4 F (36.3 C)-98.1 F (36.7 C)] 97.8 F (36.6 C) (04/07 0850) Pulse Rate:  [79-96] 92 (04/07 1056) Resp:  [12-26] 20 (04/07 1056) BP: (148-183)/(69-105) 179/86 mmHg (04/07 1056) SpO2:  [76 %-100 %] 95 % (04/07 1056) FiO2 (%):  [40 %-45 %] 40 % (04/07 0001) Weight:  [70.4 kg (155 lb 3.3 oz)] 70.4 kg (155 lb 3.3 oz) (04/07 0506)   HEMODYNAMICS:     VENTILATOR SETTINGS: Vent Mode:  [-]  FiO2 (%):  [40 %-45 %] 40 %   INTAKE / OUTPUT: Intake/Output      04/06 0701 - 04/07 0700 04/07 0701 - 04/08 0700   P.O. 300    I.V. (mL/kg) 70 (1)    IV Piggyback 50    Total Intake(mL/kg) 420 (6)    Urine (mL/kg/hr) 500 (0.3)    Total Output 500     Net -80          Urine Occurrence 6 x      PHYSICAL EXAMINATION: General:  Uncomfortable, no distress, coughs frequently Neuro: Awake and alert, oriented, following commands, non-focal; (contrast with report from last night when she was agitated and confused) HEENT:  PERR Cardiovascular: RRR, no murmurs Lungs:  B insp  crackles, Coarse B exp wheezes Abdomen:  +BS Musculoskeletal:  No edema Skin:  Warm, venous stasis changes and bruising B LE  LABS:  CBC  Recent Labs Lab 03/11/15 1215 03/14/15 0350 03/15/15 0345  WBC 2.7* 5.5 5.9  HGB 8.6* 9.5* 9.1*  HCT 28.1* 31.6* 30.7*  PLT 104* 88* 114*   Coag's No results for input(s): APTT, INR in the last 168 hours.   BMET  Recent Labs Lab 03/14/15 0350 03/15/15 0345 03/16/15 0336  NA 143 141 144  K 3.1* 4.2 3.9  CL 105 101 98  CO2 29 28 31   BUN 18 18 23   CREATININE 0.85 0.84 1.09  GLUCOSE 99 108* 94   Electrolytes  Recent Labs Lab 03/14/15 0350 03/15/15 0345 03/16/15 0336  CALCIUM 7.8* 8.0* 8.6  MG 1.7  --   --   PHOS 1.9*  --   --    Sepsis Markers  Recent Labs Lab 04/05/2015 2029  LATICACIDVEN 1.46   ABG  Recent Labs Lab 03/20/2015 2140  PHART 7.347*  PCO2ART 47.2*  PO2ART 66.3*   Liver Enzymes  Recent Labs Lab 03/17/2015 2011  AST 59*  ALT 21  ALKPHOS 83  BILITOT 0.6  ALBUMIN 3.1*   Cardiac Enzymes No results for input(s):  TROPONINI, PROBNP in the last 168 hours.   Glucose No results for input(s): GLUCAP in the last 168 hours.  Imaging Dg Chest Port 1 View  03/16/2015   CLINICAL DATA:  Respiratory failure, Hospital associated pneumonia, coronary artery disease.  EXAM: PORTABLE CHEST - 1 VIEW  COMPARISON:  Portable chest x-rays of April 6th and March 13, 2015  FINDINGS: The lungs are adequately inflated. Confluent alveolar opacities in the right upper lobe, right perihilar region, and left perihilar region are more conspicuous today. The interstitial markings elsewhere remain increased. There is partial obscuration of the left hemidiaphragm. The cardiac silhouette is top-normal to mildly enlarged. The pulmonary vascularity is indistinct. There are degenerative changes of the right shoulder.  IMPRESSION: Worsening bilateral pneumonia. Persistent increased interstitial densities elsewhere in both lungs likely  reflects mild pulmonary interstitial edema.   Electronically Signed   By: David  Martinique   On: 03/16/2015 07:36   Dg Chest Port 1 View  03/15/2015   CLINICAL DATA:  Respiratory failure.  EXAM: PORTABLE CHEST - 1 VIEW  COMPARISON:  03/14/2015.  FINDINGS: Mediastinum and hilar structures are stable. Stable cardiomegaly. Diffuse bilateral airspace disease again noted. No significant interim change. Tiny bilateral pleural effusions cannot be excluded. No pneumothorax. No acute osseous abnormality.  IMPRESSION: 1. Persistent prominent bilateral airspace disease. No significant interim change. Associated small pleural effusions cannot be excluded.  2. Stable cardiomegaly.   Electronically Signed   By: Marcello Moores  Register   On: 03/15/2015 07:22    ASSESSMENT / PLAN:  Active Problems:   HCAP (healthcare-associated pneumonia)   Acute respiratory failure   Acute UTI (urinary tract infection)   PULMONARY A: Hypoxemic Respiratory Failure - in setting of bilateral infiltrates, edema,  HCAP vs viral PNA (suspect the latter > metapneumovirus) Poor airway protection P:   Complete cefepime x 7 days on 4/7, stopped vanco 4/6 Bipap QHS & PRN sleep; she is having difficulty tolerating Wean O2 for sats > 90% Prednisone > will attempt to wean quickly, ? Contributing to delirium Delsym for cough She is teetering on th brink on intubation. Based on last conversation her son would support this short term. I believe we need to have another conversation about whether this would ultimately benefit her. Will try to arrange for 4/8  CARDIOVASCULAR A: H/o HTN P:   Continue bisoprolol 5mg  QD Will continue to push diuresis, follow I/O and CXR  RENAL A: CKD, unclear baseline.   P:   Monitor UOP and Cr  GASTROINTESTINAL A: Diarrhea, c diff negative P:   Imodium ordered Advance diet to heart healthy  HEMATOLOGIC A: Pancytopenia, chronic Fe deficiency anemia H/o lymphoma P:   Monitor CBC Transfuse for  Hg<7  INFECTIOUS A: Sepsis 2/2 HCAP vs viral infection Metapneumovirus  E-Coli UTI - sens rocephin P:   D/c vanco 4/6 D/c cefepime 4/7  ENDOCRINE A: Hypothyroidism P:   Cont home synthroid  NEUROLOGIC A: AMS c/w delirium > she improves during day and then sundowns ??Dementia:  Son endorses recent memory issues P:   Will wean prednisone quickly, ? Whether this is contributing to her delirium; to 10mg  4/7 Minimize sedating meds as able Home benzo's are ordered, may need to adjust  Lyrica restarted Cont duloxetine and wellbutrin OOB as tolerated Lights on during the day   GLOBAL:  Patient updated at bedside.   Marcello Moores (son), number is [phone].  Intubation OK short term, no CPR. Will need to set up a meeting    Baltazar Apo,  MD, PhD 03/16/2015, 12:08 PM [facility] [phone] or if no answer [phone]

## 2015-03-17 ENCOUNTER — Inpatient Hospital Stay (HOSPITAL_COMMUNITY): Payer: MEDICARE

## 2015-03-17 DIAGNOSIS — J123 Human metapneumovirus pneumonia: Secondary | ICD-10-CM

## 2015-03-17 DIAGNOSIS — J9601 Acute respiratory failure with hypoxia: Secondary | ICD-10-CM | POA: Insufficient documentation

## 2015-03-17 DIAGNOSIS — R0902 Hypoxemia: Secondary | ICD-10-CM

## 2015-03-17 LAB — CULTURE, BLOOD (ROUTINE X 2)
Culture: NO GROWTH
Culture: NO GROWTH

## 2015-03-17 MED ORDER — FENTANYL CITRATE 0.05 MG/ML IJ SOLN
100.0000 ug | Freq: Once | INTRAMUSCULAR | Status: AC
  Administered 2015-03-17: 100 ug via INTRAVENOUS
  Filled 2015-03-17: qty 2

## 2015-03-17 MED ORDER — FENTANYL BOLUS VIA INFUSION
50.0000 ug | INTRAVENOUS | Status: DC | PRN
  Administered 2015-03-17 (×3): 50 ug via INTRAVENOUS
  Administered 2015-03-18 (×2): 150 ug via INTRAVENOUS
  Administered 2015-03-18 (×2): 50 ug via INTRAVENOUS
  Administered 2015-03-18: 150 ug via INTRAVENOUS
  Administered 2015-03-18: 100 ug via INTRAVENOUS
  Administered 2015-03-18: 50 ug via INTRAVENOUS
  Filled 2015-03-17 (×3): qty 200

## 2015-03-17 MED ORDER — DEXMEDETOMIDINE HCL IN NACL 200 MCG/50ML IV SOLN
0.4000 ug/kg/h | INTRAVENOUS | Status: DC
  Administered 2015-03-17: 0.5 ug/kg/h via INTRAVENOUS
  Filled 2015-03-17: qty 50

## 2015-03-17 MED ORDER — FENTANYL CITRATE 0.05 MG/ML IJ SOLN
12.5000 ug | INTRAMUSCULAR | Status: DC | PRN
  Administered 2015-03-17 (×3): 25 ug via INTRAVENOUS
  Filled 2015-03-17 (×3): qty 2

## 2015-03-17 MED ORDER — SODIUM CHLORIDE 0.9 % IV SOLN
100.0000 ug/h | INTRAVENOUS | Status: DC
  Administered 2015-03-17 – 2015-03-18 (×2): 100 ug/h via INTRAVENOUS
  Filled 2015-03-17 (×2): qty 50

## 2015-03-17 NOTE — Progress Notes (Signed)
[facility] Progress Note Patient Name: Vicki Bauer DOB: September 26, 1928 MRN: [id number]   Date of Service  03/17/2015  HPI/Events of Note  Pain, nothing prn  eICU Interventions  Fentanyl prn     Intervention Category Intermediate Interventions: Pain - evaluation and management  Giavanna Kang 03/17/2015, 2:45 AM

## 2015-03-17 NOTE — Progress Notes (Signed)
SLP Cancellation Note  Patient Details Name: LEDIA HANFORD MRN: [id number] DOB: [birth year]   Cancelled treatment:        Pt currently with increased WOB, on non-rebreather, and sedated due to agitation.  Spoke with MD, who agrees pt is not ready for swallow evaluation.  Please reorder when pt is able to participate.  Thank you,   Quinn Axe T 03/17/2015, 10:12 AM

## 2015-03-17 NOTE — Progress Notes (Signed)
CSW continuing to follow.   CSW reviewed chart and noted that PCCM NP was able to speak with pt son, Gershon Mussel regarding pt status and pt son wishes to proceed with comfort measures.   CSW received consult that son needs help with dying process / next steps and  has questions regarding cremation (least expensive) and Physicist, medical.  CSW contacted pt son, Tom via telephone. CSW introduced self and provided support. Pt son discussed that pt had expressed to him in the past that she wished to be cremated and have her ashes spread in [place]. CSW discussed crematorium options and discussed with pt son that [facility] as been found to be the lowest cost for cremation. CSW offered to provide contact phone number to [facility] at this time, but pt son preferred for CSW to provide information at bedside for him to obtain when he arrived this evening. Pt son discussed that he is aware that pt has a life Set designer and CSW encouraged pt son to contact life insurance policy to determine if pt plan includes assisting with payment of cremation.   CSW provided emotional support to pt son as he discussed that he has never been through the dying process with a parent as his father is still living and attempting to cope as well as possible. CSW discussed grief support services through [facility] and [facility] and pt son appreciative of information.   CSW provided information for cremation services/funeral homes in pt room and notified RN to ensure that information went with pt if pt transferred to another floor.   CSW ensured that pt son had this CSW contact information for any further questions and support.  CSW to continue to follow to provide support as pt has transitioned to comfort care.   Alison Murray, MSW, [facility] [phone]

## 2015-03-17 NOTE — Progress Notes (Signed)
PULMONARY / CRITICAL CARE MEDICINE HISTORY AND PHYSICAL EXAMINATION   Name: Vicki Bauer MRN: [id number] DOB: Apr 29, 1928    ADMISSION DATE:  03/12/2015  PRIMARY SERVICE: PCCM  CHIEF COMPLAINT:  SOB, hypoxia  BRIEF PATIENT DESCRIPTION: 81 y/o F, ALF resident, presents with worsening fever and SOB, found to be hypoxic and with infiltrates on CXR c/w PNA vs possible flu.    SIGNIFICANT EVENTS / STUDIES:  4/02  Presented to ED, CXR with multifocal patchy infiltrates, worse in LUL 4/05  Wore bipap overnight, wheezing + cough  LINES / TUBES: Foley 4/1 >>   CULTURES: BCx2 x 2 4/1 >>  UC 4/1 >> E-Coli >> sens rocephin C-Diff 4/3 >> neg  RVP 4/2 >> metapneumovirus   ANTIBIOTICS: Vanc 4/1 >> 4/6 Cefepime 4/1 >> 4/7  SUBJECTIVE:   Worsening delirium overnight, placed on precedex.  RN reports intermittent agitation and desaturations  VITAL SIGNS: Temp:  [97.6 F (36.4 C)-98.8 F (37.1 C)] 97.6 F (36.4 C) (04/08 0800) Pulse Rate:  [75-104] 79 (04/08 0600) Resp:  [16-26] 17 (04/08 0600) BP: (143-184)/(60-91) 184/84 mmHg (04/08 0634) SpO2:  [81 %-99 %] 94 % (04/08 0757) FiO2 (%):  [40 %-55 %] 45 % (04/08 0757) Weight:  [153 lb (69.4 kg)] 153 lb (69.4 kg) (04/08 0800)   HEMODYNAMICS:     VENTILATOR SETTINGS: Vent Mode:  [-]  FiO2 (%):  [40 %-55 %] 45 %   INTAKE / OUTPUT: Intake/Output      04/07 0701 - 04/08 0700 04/08 0701 - 04/09 0700   P.O.     I.V. (mL/kg) 351.8 (5)    IV Piggyback 60    Total Intake(mL/kg) 411.8 (5.8)    Urine (mL/kg/hr) 610 (0.4)    Total Output 610     Net -198.2          Urine Occurrence 2 x      PHYSICAL EXAMINATION: General:  Frail elderly female, coughs frequently on VM  Neuro: lethargic, intermittent agitation, calling for people who aren't in the room  HEENT:  PERR Cardiovascular: RRR, no murmurs Lungs:  Mild paradoxical movement, lungs bilaterally diminished  Abdomen:  +BS Musculoskeletal:  No acute deformities, generalized  edema  Skin:  Warm, venous stasis changes and bruising B LE  LABS:  CBC  Recent Labs Lab 03/11/15 1215 03/14/15 0350 03/15/15 0345  WBC 2.7* 5.5 5.9  HGB 8.6* 9.5* 9.1*  HCT 28.1* 31.6* 30.7*  PLT 104* 88* 114*   Coag's No results for input(s): APTT, INR in the last 168 hours.   BMET  Recent Labs Lab 03/14/15 0350 03/15/15 0345 03/16/15 0336  NA 143 141 144  K 3.1* 4.2 3.9  CL 105 101 98  CO2 29 28 31   BUN 18 18 23   CREATININE 0.85 0.84 1.09  GLUCOSE 99 108* 94   Electrolytes  Recent Labs Lab 03/14/15 0350 03/15/15 0345 03/16/15 0336  CALCIUM 7.8* 8.0* 8.6  MG 1.7  --   --   PHOS 1.9*  --   --    Sepsis Markers  Recent Labs Lab 04/05/2015 2029  LATICACIDVEN 1.46   ABG  Recent Labs Lab 03/15/2015 2140  PHART 7.347*  PCO2ART 47.2*  PO2ART 66.3*   Liver Enzymes  Recent Labs Lab 03/24/2015 2011  AST 59*  ALT 21  ALKPHOS 83  BILITOT 0.6  ALBUMIN 3.1*   Cardiac Enzymes No results for input(s): TROPONINI, PROBNP in the last 168 hours.   Glucose  Recent Labs Lab 03/16/15  [place]    Imaging Dg Chest Port 1 View  03/17/2015   CLINICAL DATA:  Respiratory failure  EXAM: PORTABLE CHEST - 1 VIEW  COMPARISON:  03/16/2015  FINDINGS: Stable cardiac silhouette. There is bilateral diffuse airspace disease with greatest density in the right upper lobe and then the right lower lobe. No pneumothorax. Lung bases poorly evaluated.  IMPRESSION: No significant change in bilateral diffuse airspace disease most dense in the right upper lobe suggesting multifocal pneumonia.   Electronically Signed   By: Suzy Bouchard M.D.   On: 03/17/2015 07:20   Dg Chest Port 1 View  03/16/2015   CLINICAL DATA:  Acute respiratory failure with hypoxia  EXAM: PORTABLE CHEST - 1 VIEW  COMPARISON:  March 16, 2015 5 a.m.  FINDINGS: The heart size and mediastinal contours are stable. The heart size is enlarged. Consolidation of bilateral lungs are identified more prominently  involving the right upper lobe unchanged. There probable small bilateral pleural effusions unchanged. The visualized skeletal structures are stable.  IMPRESSION: Consolidations in bilateral lungs consistent with pneumonia unchanged compared to the prior earlier x-ray from today. There are probable small bilateral pleural effusions.   Electronically Signed   By: Abelardo Diesel M.D.   On: 03/16/2015 19:44   Dg Chest Port 1 View  03/16/2015   CLINICAL DATA:  Respiratory failure, Hospital associated pneumonia, coronary artery disease.  EXAM: PORTABLE CHEST - 1 VIEW  COMPARISON:  Portable chest x-rays of April 6th and March 13, 2015  FINDINGS: The lungs are adequately inflated. Confluent alveolar opacities in the right upper lobe, right perihilar region, and left perihilar region are more conspicuous today. The interstitial markings elsewhere remain increased. There is partial obscuration of the left hemidiaphragm. The cardiac silhouette is top-normal to mildly enlarged. The pulmonary vascularity is indistinct. There are degenerative changes of the right shoulder.  IMPRESSION: Worsening bilateral pneumonia. Persistent increased interstitial densities elsewhere in both lungs likely reflects mild pulmonary interstitial edema.   Electronically Signed   By: David  Martinique   On: 03/16/2015 07:36    ASSESSMENT / PLAN:  Principal Problem:   Human metapneumovirus pneumonia Active Problems:   HCAP (healthcare-associated pneumonia)   Acute respiratory failure   Acute UTI (urinary tract infection)   PULMONARY A: Hypoxemic Respiratory Failure - in setting of bilateral infiltrates, edema,  HCAP vs viral PNA (suspect the latter > metapneumovirus) Poor airway protection P:   Complete cefepime x 7 days on 4/7, stopped vanco 4/6 Bipap QHS & PRN sleep; she is having difficulty tolerating Wean O2 for sats > 90% Prednisone,  wean quickly, ? Contributing to delirium Delsym for cough She is teetering on the brink on  intubation. Based on last conversation her son would support this short term. She has not made any improvement since admission.  Unfortunately, she has been on a continuum of decline.  Do not feel intubation would be a short term process in the situation and ultimately not in her best interest  Hold further diuresis 4/8 as has improved cxr   CARDIOVASCULAR A: H/o HTN P:   Continue bisoprolol 5mg  QD Follow I/O and CXR  RENAL A: CKD, unclear baseline.   P:   Monitor UOP and Cr  GASTROINTESTINAL A: Diarrhea, c diff negative P:   Imodium ordered Heart healthy as tolerated  HEMATOLOGIC A: Pancytopenia, chronic Fe Deficiency Anemia H/o lymphoma P:   Monitor CBC Transfuse for Hg<7  INFECTIOUS A: Sepsis 2/2 HCAP vs viral infection Metapneumovirus  E-Coli UTI - sens rocephin P:   D/c vanco 4/6 D/c cefepime 4/7 Monitor fever curve / wbc  ENDOCRINE A: Hypothyroidism P:   Cont home synthroid  NEUROLOGIC A: AMS c/w delirium - she improves during day and then sundowns ??Dementia -  Son endorses recent memory issues P:   Will wean prednisone quickly, ? Whether this is contributing to her delirium; to 10mg  4/7 Minimize sedating meds as able Home benzo's are ordered, may need to adjust  Lyrica Cont duloxetine and wellbutrin OOB as tolerated Lights on during the day Wean precedex to off    GLOBAL:  Attempted to call son Marcello Moores at number below 4/8. He was at an MD appt and asked that I call back at 1130 @ [phone] (his personal desk number)  Marcello Moores (son), number is [phone].  Intubation OK short term, no CPR.  Await above conversation regarding intubation / further plan of care.   Vicki Gens, NP-C [facility] Pgr: [phone] or [phone] 03/17/2015, 9:51 AM     Attending Note:  I have examined patient, reviewed labs, studies and notes. I have discussed the case with B Ollis, and I agree with the data and plans as amended above. She  has pneumonia from metapneumovirus. Her improvement has plateaued and in fact she seems worse over the past 36h. On my eval today she is moaning, clearly uncomfortable, requiring sedating meds to tolerate the WOB.  I am concerned that she is dying despite all aggressive medical care. I spoke with her son Gershon Mussel today, informed her of the changes in her status. I told him that she is dying, that I do not see any benefit from intubation or MV at this point because it wouldn't get her through this illness. He agreed with me, is going to work to get to the hospital soon to see her. I will increase her comfort meds. Independent CC time 40 minutes.   Baltazar Apo, MD, PhD 03/17/2015, 12:37 PM  [facility] [phone] or if no answer [phone]

## 2015-03-17 NOTE — Progress Notes (Signed)
Discussed patients status with her son Gershon Mussel) at bedside.  He indicates understanding and wishes to proceed with comfort measures.      Plan: Social work consult to assist with EOL issues Fentanyl gtt for comfort Wean precedex gtt to off  Transfer patient to medical floor once fentanyl gtt initiated    Contact info for Tom:  Personal Cell [phone], work [phone], work cell [phone], [place], NP-C [facility] Pgr: [phone] or [phone]  Baltazar Apo, MD, PhD 03/24/2015, 1:48 PM [facility] [phone] or if no answer [phone]

## 2015-03-21 ENCOUNTER — Telehealth: Payer: Self-pay

## 2015-03-21 NOTE — Telephone Encounter (Signed)
Received cremation certificate from [employer] 03/21/15.  Taking upstairs for Dr. Lamonte Sakai to sign.  Received back 03/22/15 and called for pick-up.

## 2015-04-05 NOTE — Discharge Summary (Signed)
PULMONARY / CRITICAL CARE MEDICINE HISTORY AND PHYSICAL EXAMINATION   Name: Vicki Bauer MRN: [id number] DOB: Dec 19, 1927    ADMISSION DATE:  03-17-15  Date of death: 2015-03-25  CHIEF COMPLAINT:  SOB, hypoxia  Final cause of death Metapneumovirus pneumonia  Secondary causes of death Acute respiratory failure Possible superimposed healthcare associated pneumonia Urinary tract infection Dysphasia And poor airway protection ICU delirium/delirium of critical illness Chronic renal insufficiency History of hypertension Diarrhea, C. Difficile negative Chronic pancytopenia History lymphoma Iron deficiency anemia Severe sepsis Hypothyroidism Suspected underlying dementia History of coronary artery disease   BRIEF PATIENT DESCRIPTION: 81 y/o F, ALF resident, presents with worsening fever and SOB, found to be hypoxic and with infiltrates on CXR c/w PNA. Her respiratory viral panel grew out metapneumovirus. She was supported with noninvasive positive pressure ventilation, treated with empiric antibiotics in case there was a coexisting bacterial pneumonia. She had some wheezing on presentation and there was some concern for possible bronchospasm for which she was treated with steroids. Despite aggressive care she plateaued her improvement and unfortunately began to develop more respiratory distress and more delirium. Given her worsening clinical status and poor prognosis the decision was made to concentrate on comfort care. She died on 2015/03/25.   CULTURES: BCx2 x 2 March 17, 2023 >>  UC 03/17/23 >> E-Coli >> sens rocephin C-Diff 4/3 >> neg  RVP 4/2 >> metapneumovirus   Baltazar Apo, MD, PhD 04/05/2015, 10:29 PM [facility] [phone] or if no answer [phone]

## 2015-04-09 NOTE — Progress Notes (Signed)
Patient pronounced dead at 1200. This RN and Kenna Gilbert RN verified passing of patient, no heart sounds auscultated. Son was present on floor at the time of patients passing. Awaiting Dr. Melvyn Novas for completion of death certificate. Emotional support provided to son, educated that [employer] will be in touch with him.

## 2015-04-09 NOTE — Progress Notes (Signed)
Patient actively dying, moaning with each exhalation. RN providing PRN fentanyl as ordered in attempt to keep patient comfortable. Moaning has decreased, patient eyes fixed open with sclera red, non-verbal and non-reactive.

## 2015-04-09 DEATH — deceased

## 2015-04-21 ENCOUNTER — Other Ambulatory Visit: Payer: MEDICARE

## 2015-04-21 ENCOUNTER — Ambulatory Visit: Payer: MEDICARE

## 2015-04-21 ENCOUNTER — Ambulatory Visit: Payer: MEDICARE | Admitting: Hematology & Oncology

## 2016-12-22 IMAGING — CR DG CHEST 2V
2 series · 2 of 2 positions shown · non-contrast
Comparison: November 29, 2014

CLINICAL DATA: Patient fell earlier today.  History of lymphoma

EXAM:
CHEST  2 VIEW

[w chest lat]
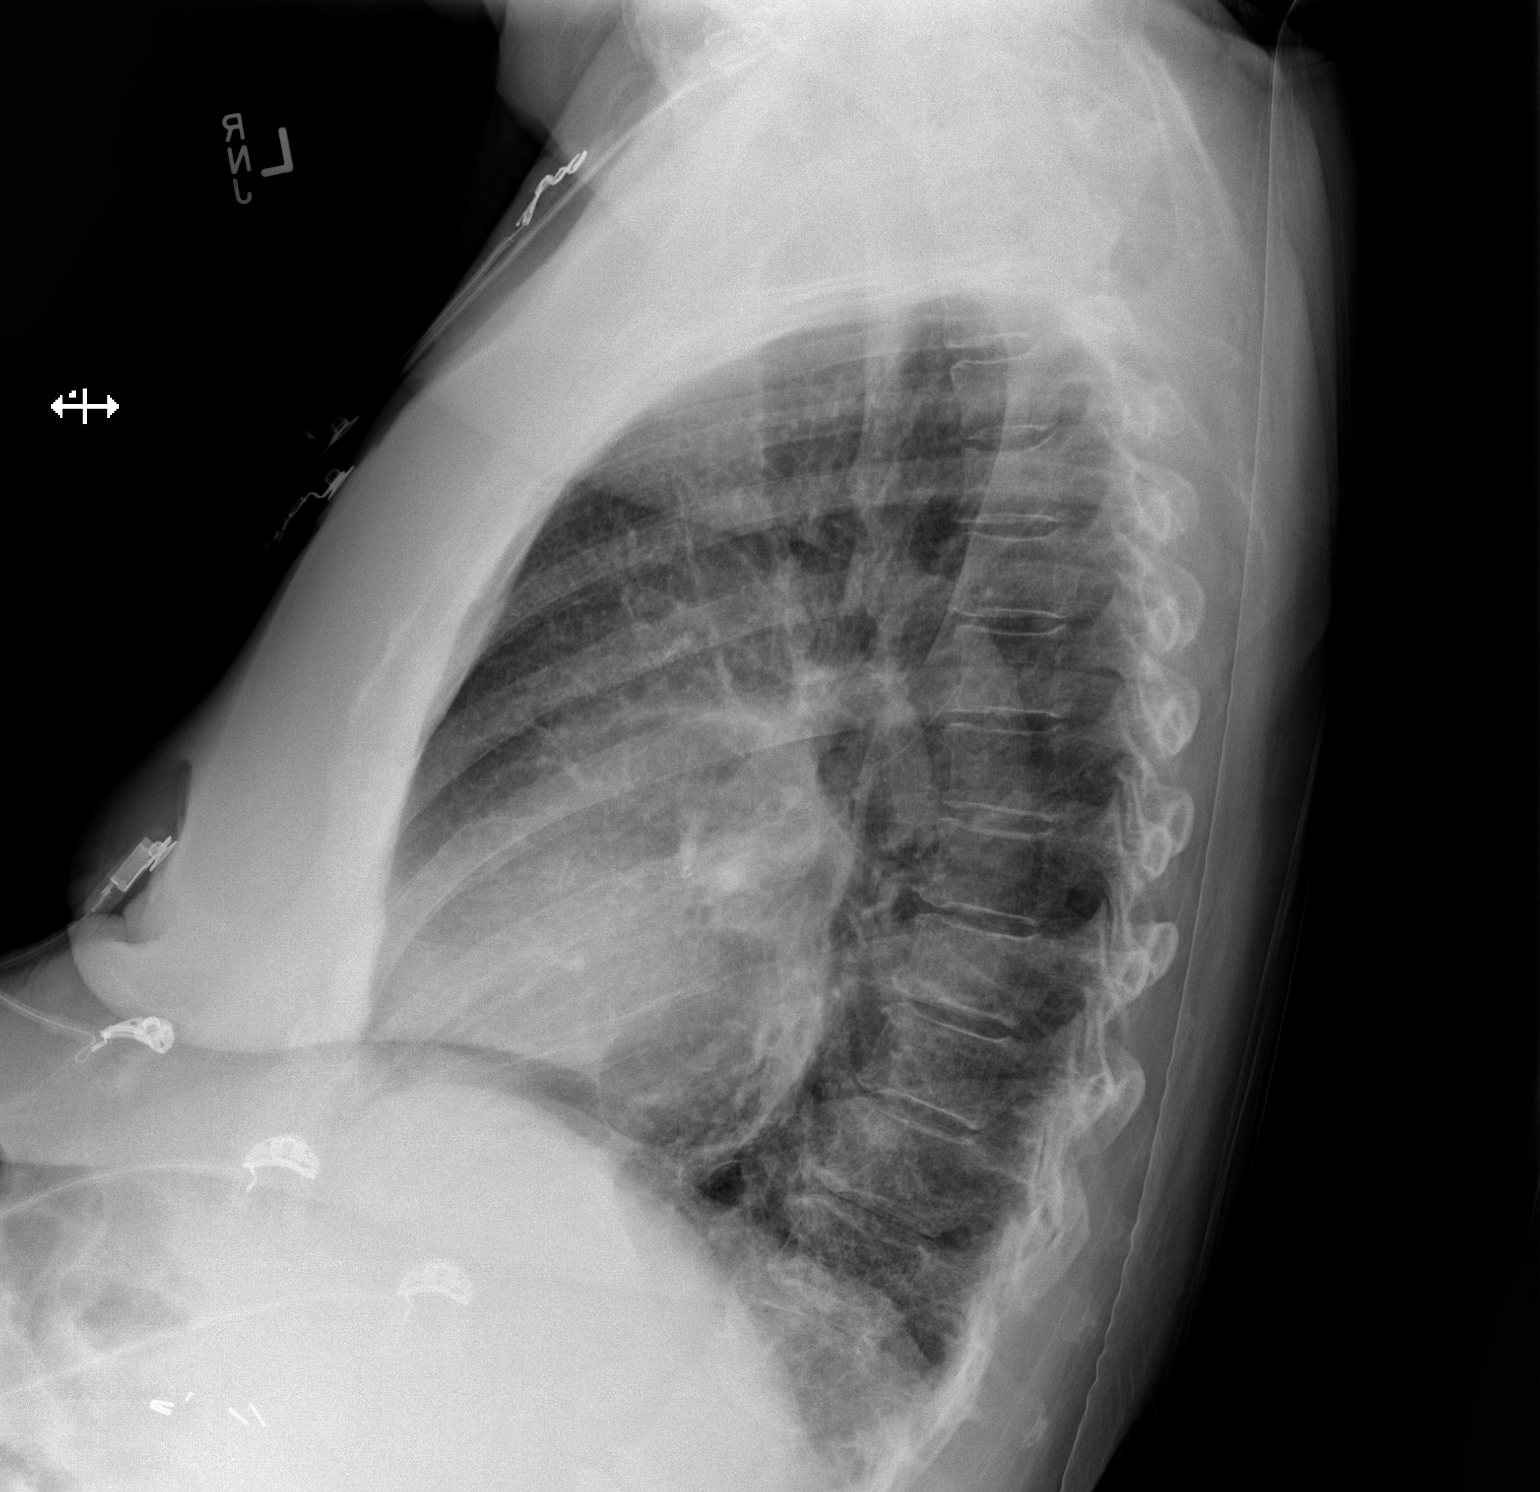

[x chest ap]
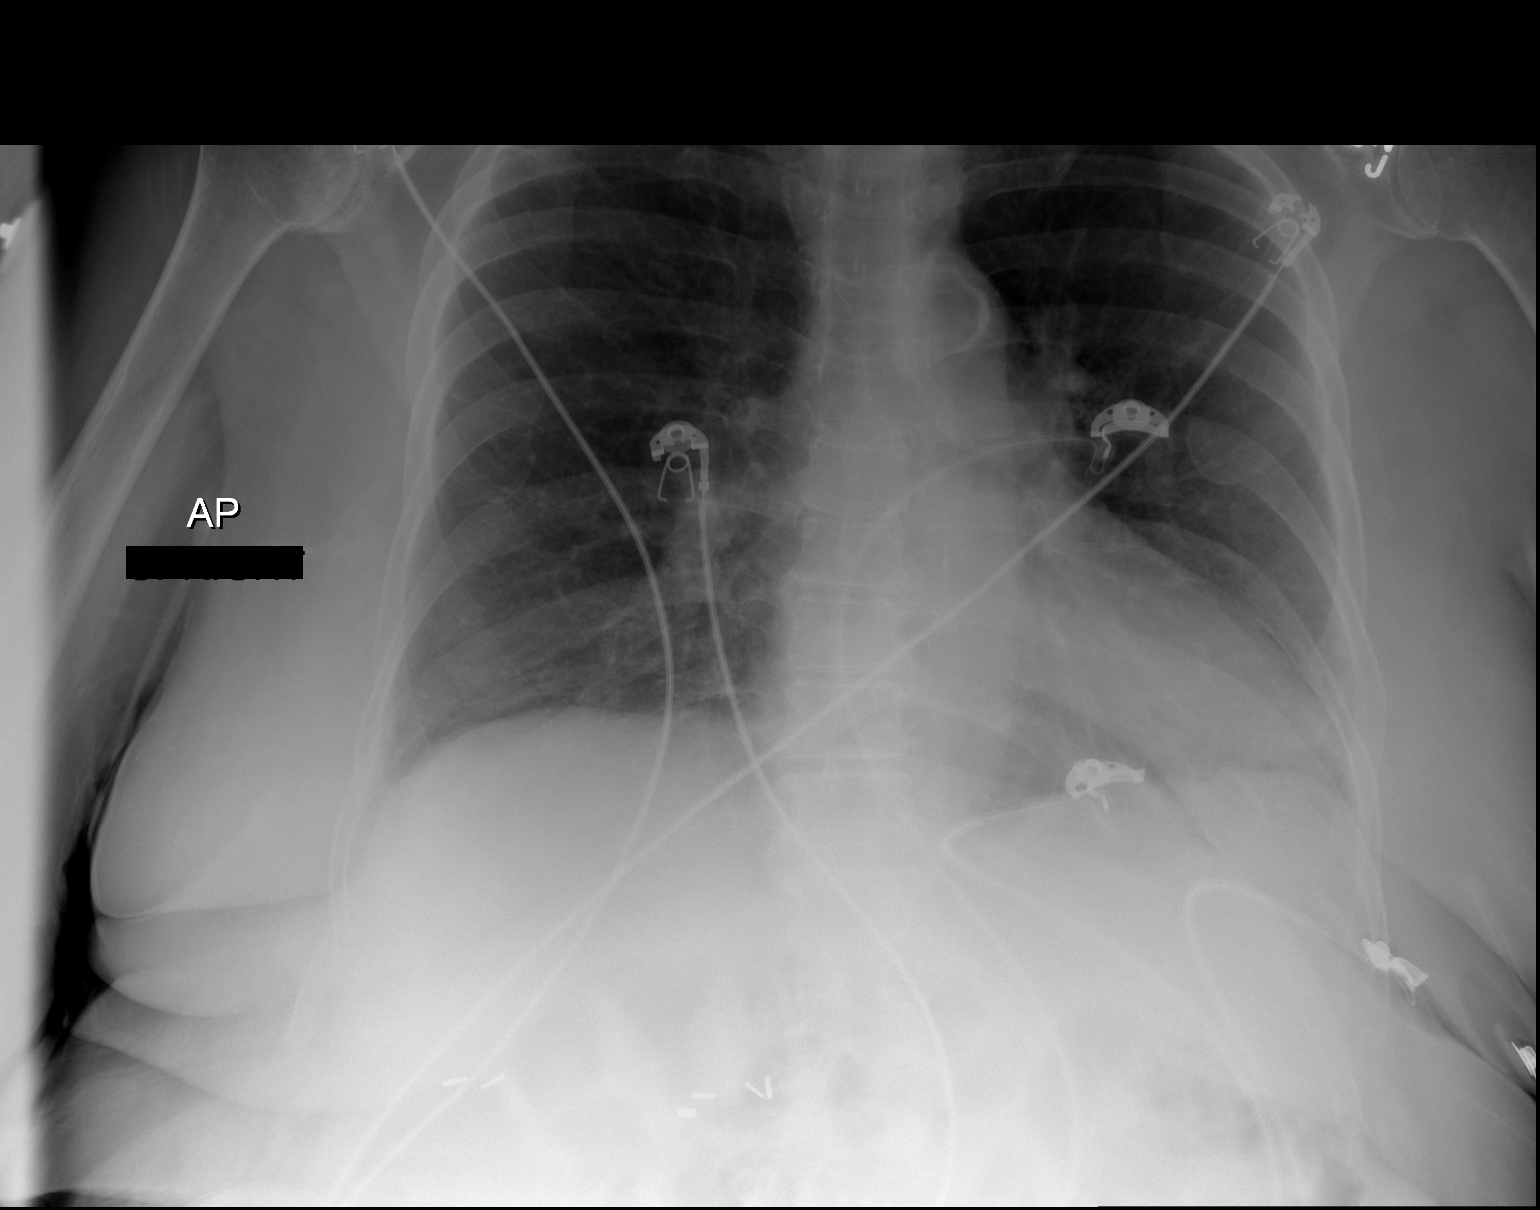

[2 of 2 positions shown; findings below may reference images not displayed]

FINDINGS: There is no edema or consolidation. Heart is upper normal in size
with pulmonary vascularity within normal limits. No adenopathy.
There is atherosclerotic change in the aorta. No bone lesions. No
pneumothorax.
IMPRESSION: No edema or consolidation.  No adenopathy.

## 2016-12-23 IMAGING — US US RENAL
1 series · 14 of 25 positions shown · non-contrast
Comparison: None.

CLINICAL DATA: Acute renal injury, hypertension

EXAM:
RENAL/URINARY TRACT ULTRASOUND COMPLETE

[Series 1: us renal · 0.21mm/px · 14 of 37 slices shown]
[im 1/37]
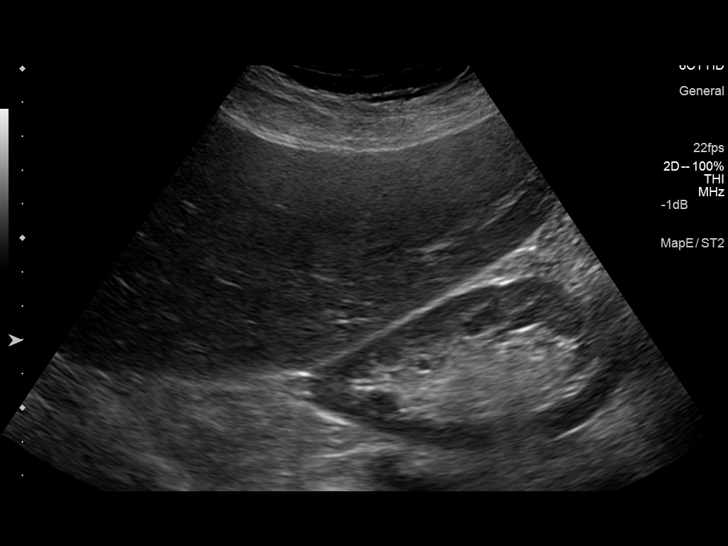
[im 4/37]
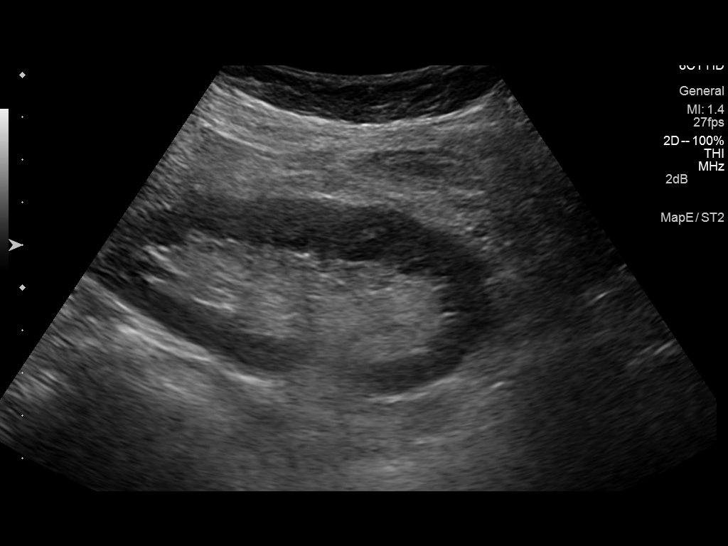
[im 7/37]
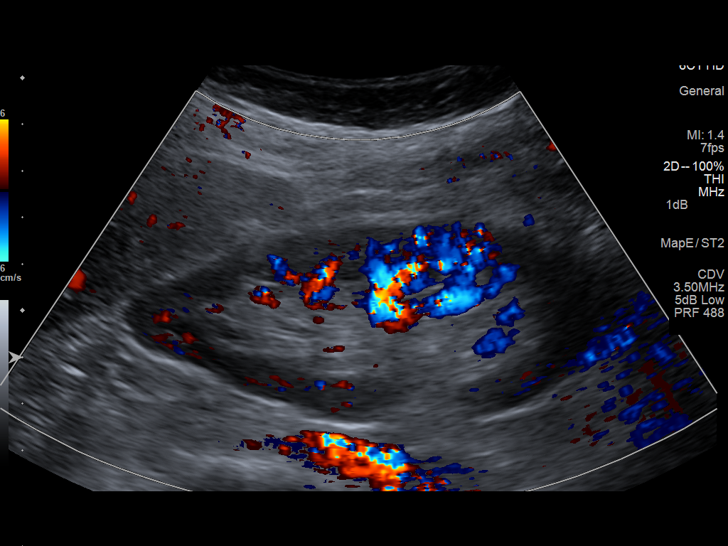
[im 10/37]
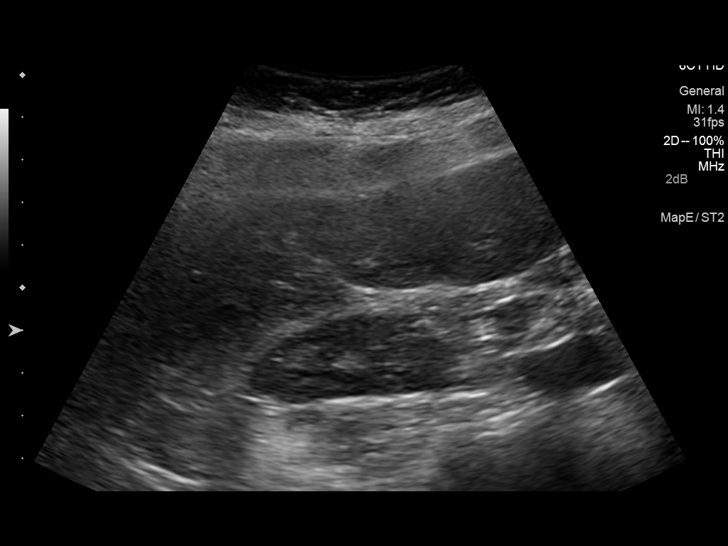
[im 13/37]
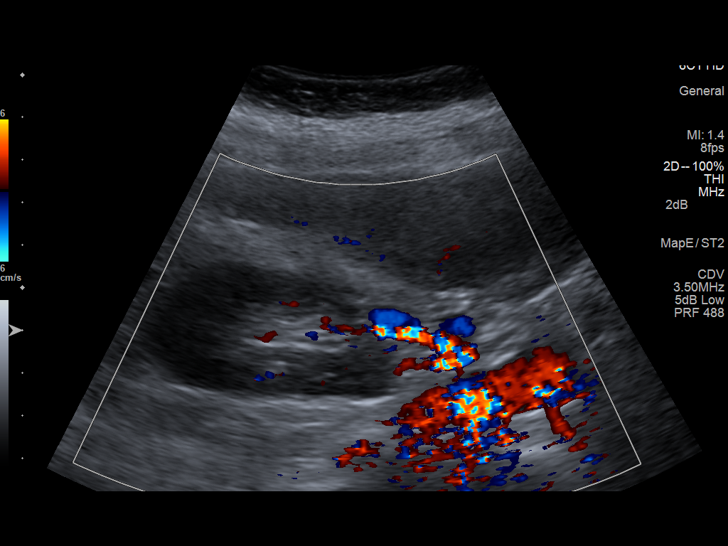
[im 14/37]
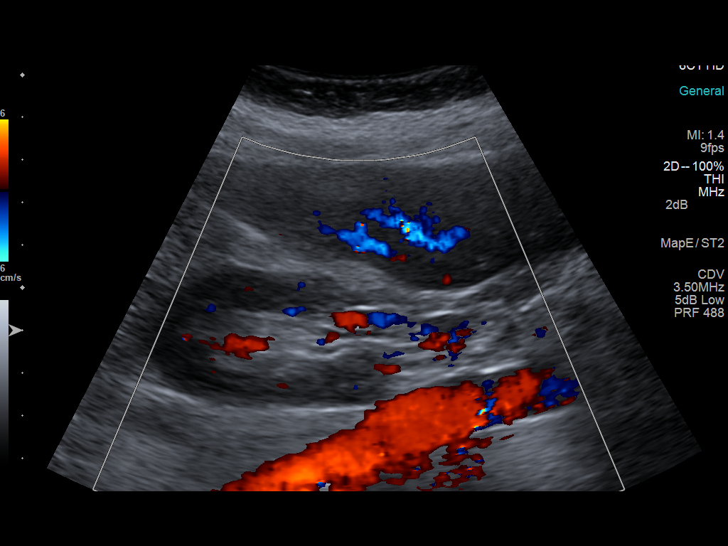
[im 17/37]
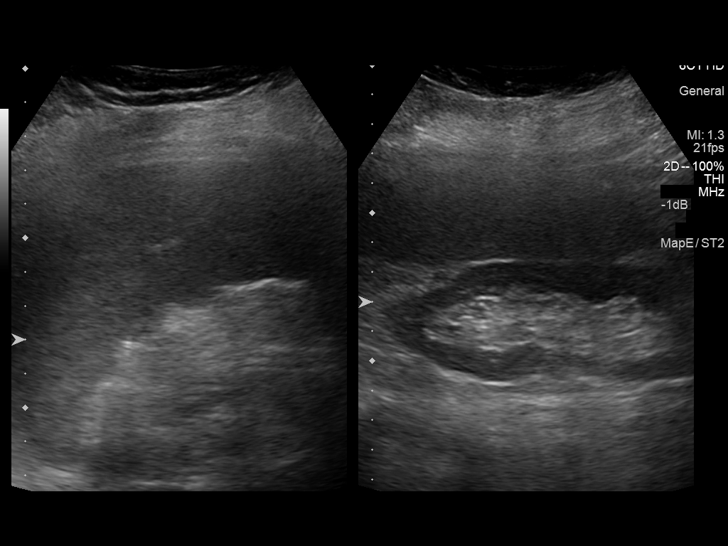
[im 20/37]
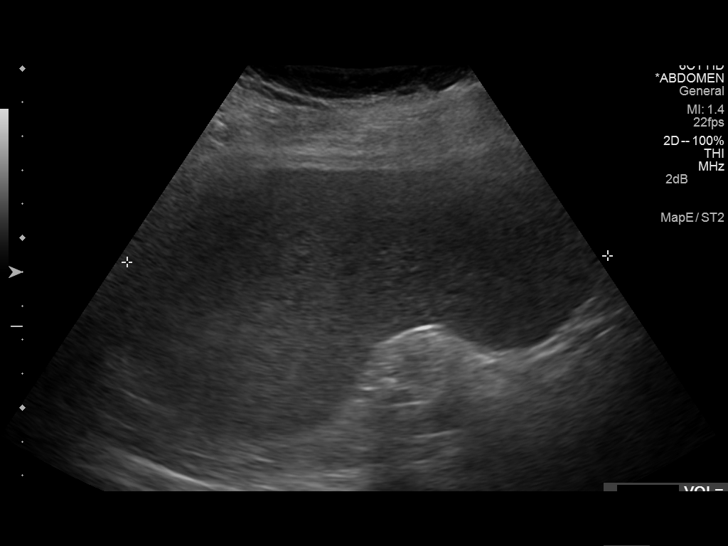
[im 23/37]
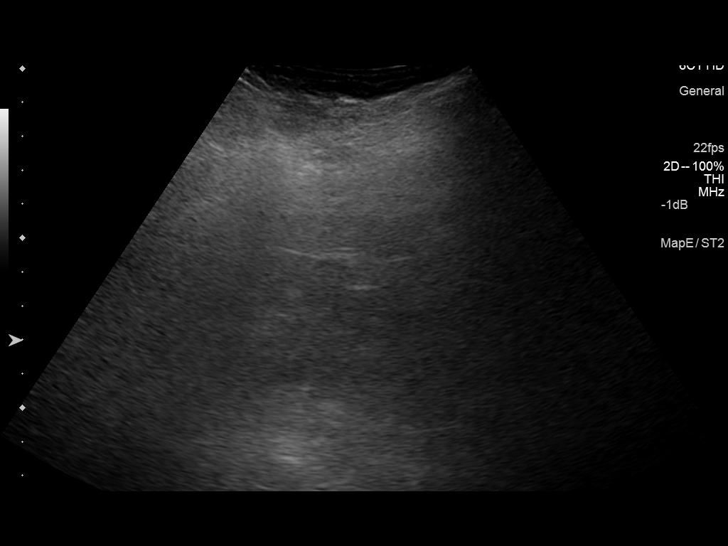
[im 25/37]
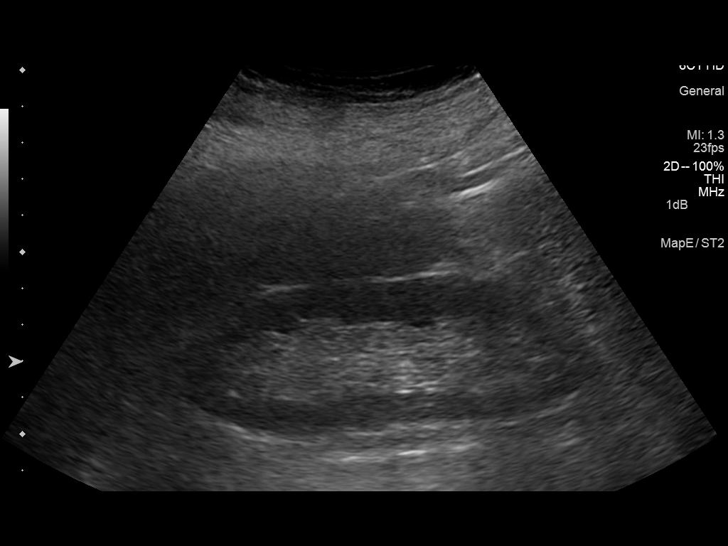
[im 28/37]
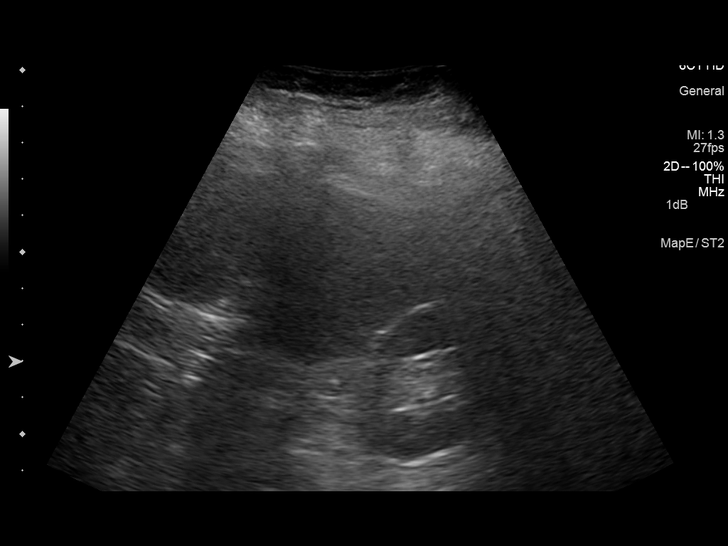
[im 31/37]
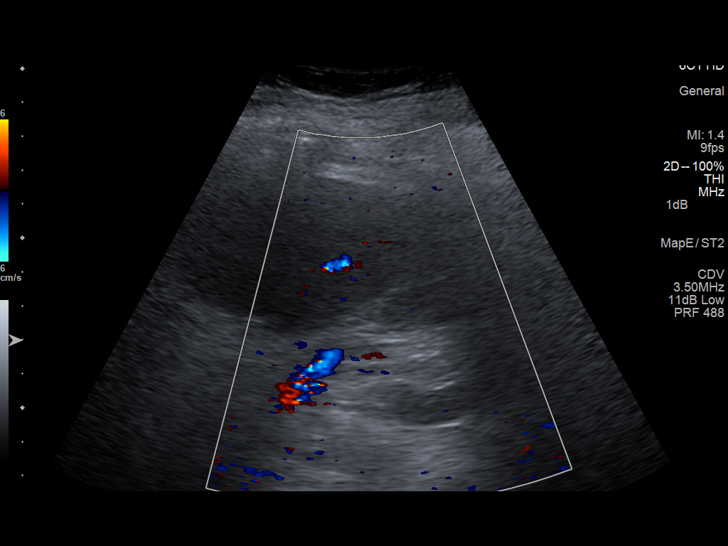
[im 34/37]
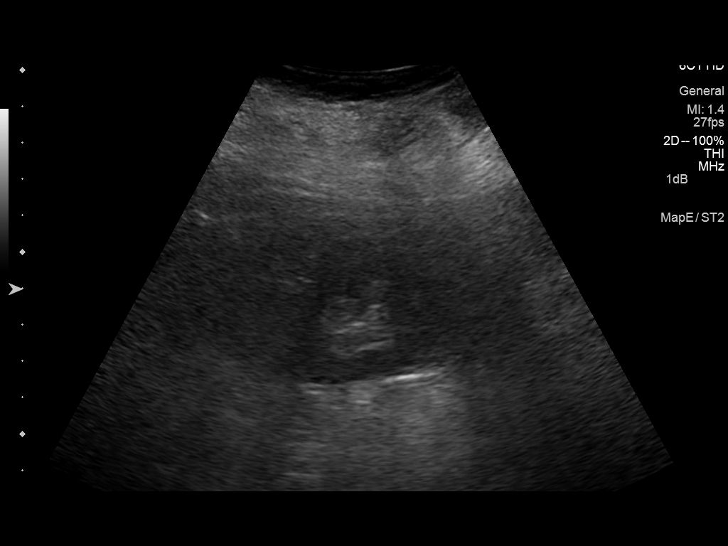
[im 37/37]
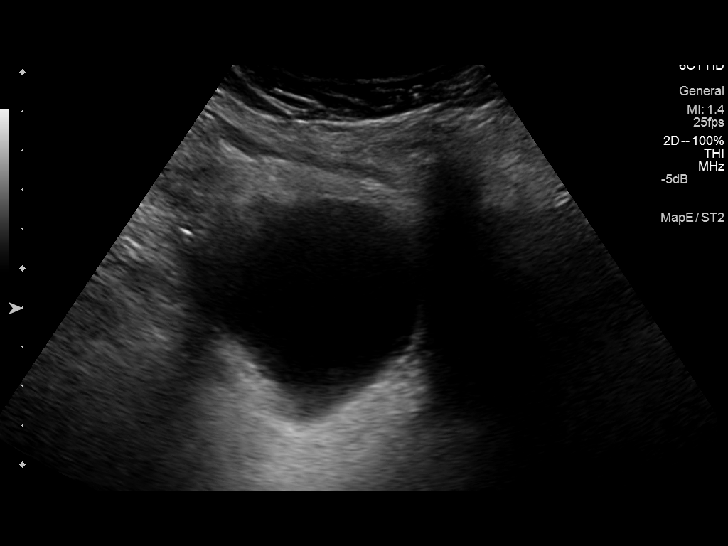

[14 of 25 positions shown; findings below may reference images not displayed]

FINDINGS: Right Kidney:

Length: 10.5 cm.. Echogenicity within normal limits. No mass or
hydronephrosis visualized.

Left Kidney:

Length: 11.6 cm.. Echogenicity within normal limits. No mass or
hydronephrosis visualized.

Bladder:

Appears normal for degree of bladder distention.

The spleen is enlarged in size measuring 14 cm in greatest length.
Splenic volume is calculated at 676 mL.
IMPRESSION: The kidneys are within normal limits bilaterally.

Changes consistent with splenomegaly.

## 2016-12-26 IMAGING — CT CT BIOPSY
3 of 4 series · 6 of 14 positions shown, 9 images · non-contrast
Comparison: none

INDICATION: Pancytopenia. Please perform CT guided bone marrow biopsy and
aspiration for tissue diagnostic purposes.

[Series 4: add scan 5.0 b70f · axial · 0.74mm/px · z∈[-129,-124]mm · 2 of 4 slices shown, 5 images (1 of 3)]
[im 2/4  soft-tissue]
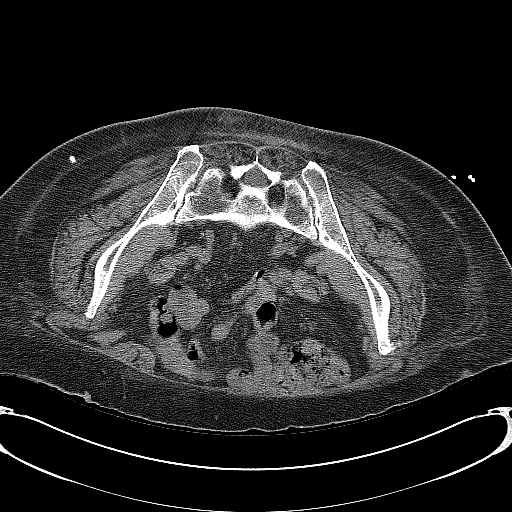
[im 2/4  lung]
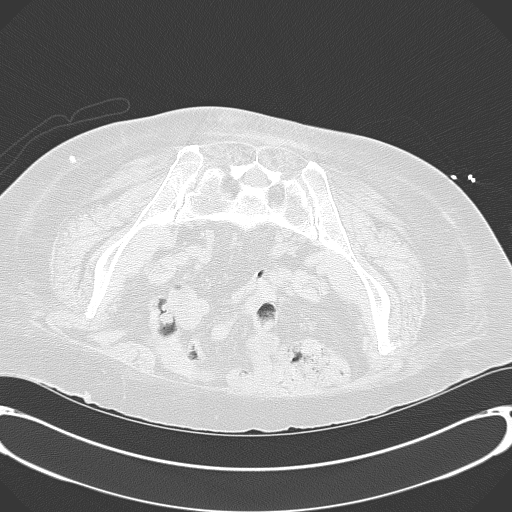
[im 2/4  bone]
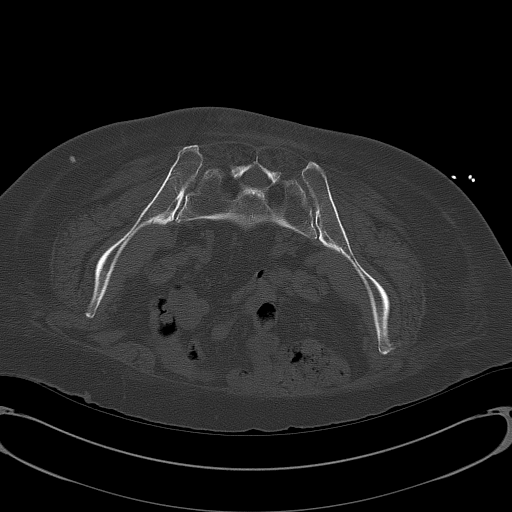
[im 3/4  soft-tissue]
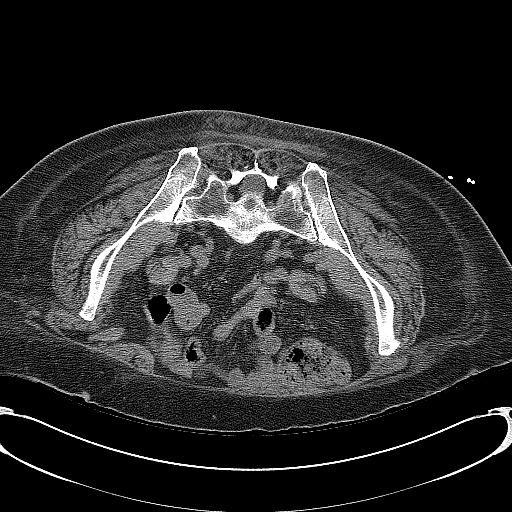
[im 3/4  lung]
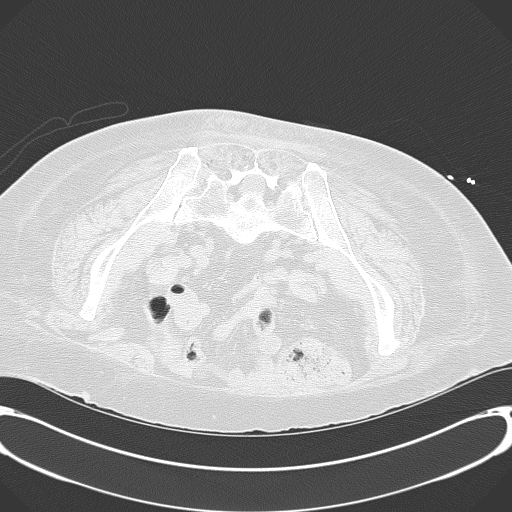

[Series 5: add scan 5.0 b70f · axial · 0.74mm/px · z∈[-124,-119]mm · 2 of 4 slices shown (2 of 3)]
[im 2/4  soft-tissue]
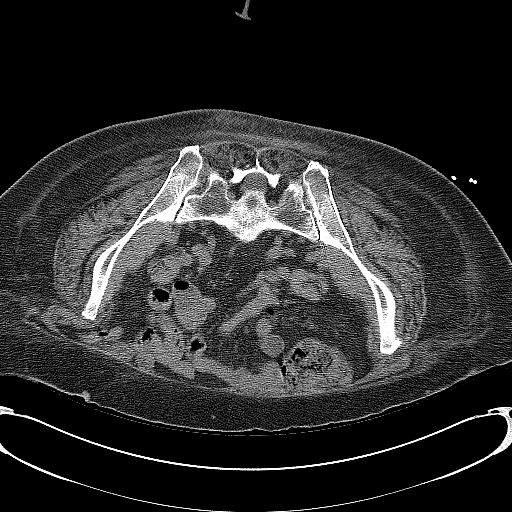
[im 3/4  soft-tissue]
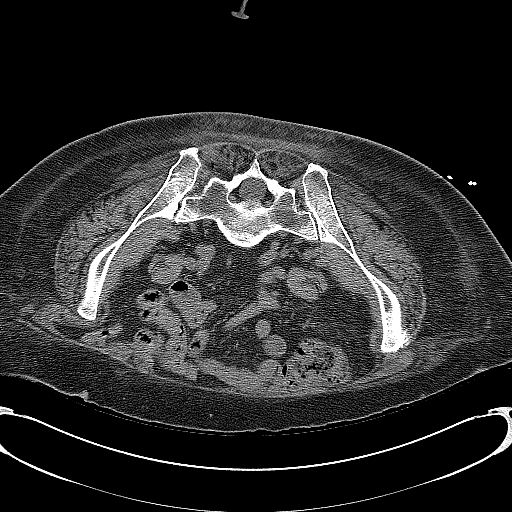

[Series 6: add scan 5.0 b70f · axial · 0.74mm/px · z∈[-124,-119]mm · 2 of 4 slices shown (3 of 3)]
[im 2/4  soft-tissue]
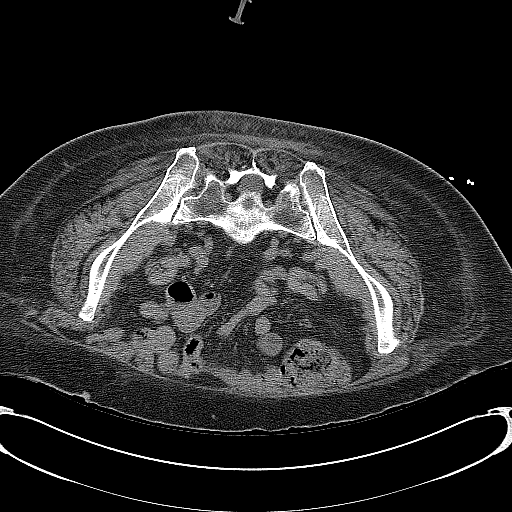
[im 3/4  soft-tissue]
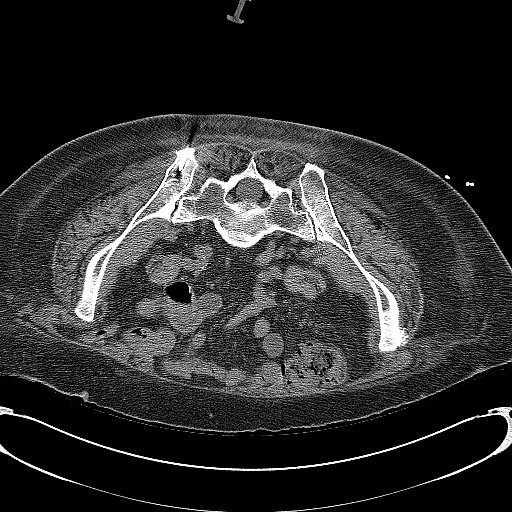

[6 of 14 positions shown; findings below may reference images not displayed]

EXAM:
CT GUIDED BONE MARROW BIOPSY AND ASPIRATION

MEDICATIONS:
Fentanyl 25 mcg IV; Versed 0.5 mg IV

ANESTHESIA/SEDATION:
Sedation Time

5 minutes

CONTRAST:  None

COMPLICATIONS:
None immediate.

PROCEDURE:
Informed consent was obtained from the patient following an
explanation of the procedure, risks, benefits and alternatives. The
patient understands, agrees and consents for the procedure. All
questions were addressed. A time out was performed prior to the
initiation of the procedure. The patient was positioned prone and
non-contrast localization CT was performed of the pelvis to
demonstrate the iliac marrow spaces. The operative site was prepped
and draped in the usual sterile fashion.

Under sterile conditions and local anesthesia, a 22 gauge spinal
needle was utilized for procedural planning. Next, an 11 gauge
coaxial bone biopsy needle was advanced into the left iliac marrow
space. Needle position was confirmed with CT imaging. Initially,
bone marrow aspiration was performed. Next, a bone marrow biopsy was
obtained with the 11 gauge outer bone marrow device. The 11 gauge
coaxial bone biopsy needle was re-advanced into a slightly different
location within the left iliac marrow space, positioning was
confirmed and an additional bone marrow biopsy was obtained. Samples
were prepared with the cytotechnologist and deemed adequate. The
needle was removed intact. Hemostasis was obtained with compression
and a dressing was placed. The patient tolerated the procedure well
without immediate post procedural complication.
IMPRESSION: Successful CT guided left iliac bone marrow aspiration and core
biopsies.
